# Patient Record
Sex: Female | Born: 1985
Health system: Southern US, Community
[De-identification: ages and names within clinical notes are randomized; demographics above are authoritative.]

## PROBLEM LIST (undated history)

## (undated) ENCOUNTER — Emergency Department (HOSPITAL_BASED_OUTPATIENT_CLINIC_OR_DEPARTMENT_OTHER): Admission: EM | Payer: Medicaid Other | Source: Home / Self Care

## (undated) DIAGNOSIS — O24419 Gestational diabetes mellitus in pregnancy, unspecified control: Secondary | ICD-10-CM

## (undated) DIAGNOSIS — G4730 Sleep apnea, unspecified: Secondary | ICD-10-CM

## (undated) DIAGNOSIS — L732 Hidradenitis suppurativa: Secondary | ICD-10-CM

## (undated) DIAGNOSIS — B009 Herpesviral infection, unspecified: Secondary | ICD-10-CM

## (undated) DIAGNOSIS — B029 Zoster without complications: Secondary | ICD-10-CM

## (undated) DIAGNOSIS — K8020 Calculus of gallbladder without cholecystitis without obstruction: Secondary | ICD-10-CM

## (undated) DIAGNOSIS — N760 Acute vaginitis: Secondary | ICD-10-CM

## (undated) DIAGNOSIS — A749 Chlamydial infection, unspecified: Secondary | ICD-10-CM

## (undated) DIAGNOSIS — B9689 Other specified bacterial agents as the cause of diseases classified elsewhere: Secondary | ICD-10-CM

## (undated) DIAGNOSIS — E119 Type 2 diabetes mellitus without complications: Secondary | ICD-10-CM

## (undated) HISTORY — PX: CHOLECYSTECTOMY: SHX55

## (undated) HISTORY — PX: INCISE AND DRAIN ABCESS: PRO64

---

## 2010-02-28 ENCOUNTER — Emergency Department (HOSPITAL_BASED_OUTPATIENT_CLINIC_OR_DEPARTMENT_OTHER): Admission: EM | Admit: 2010-02-28 | Discharge: 2010-02-28 | Payer: Self-pay | Admitting: Emergency Medicine

## 2010-03-02 ENCOUNTER — Emergency Department (HOSPITAL_COMMUNITY): Admission: EM | Admit: 2010-03-02 | Discharge: 2010-03-02 | Payer: Self-pay | Admitting: Emergency Medicine

## 2010-06-08 ENCOUNTER — Emergency Department (HOSPITAL_BASED_OUTPATIENT_CLINIC_OR_DEPARTMENT_OTHER): Admission: EM | Admit: 2010-06-08 | Discharge: 2010-06-08 | Payer: Self-pay | Admitting: Emergency Medicine

## 2010-06-20 ENCOUNTER — Ambulatory Visit (HOSPITAL_COMMUNITY): Admission: RE | Admit: 2010-06-20 | Discharge: 2010-06-21 | Payer: Self-pay | Admitting: General Surgery

## 2010-06-20 ENCOUNTER — Encounter (INDEPENDENT_AMBULATORY_CARE_PROVIDER_SITE_OTHER): Payer: Self-pay | Admitting: General Surgery

## 2010-06-22 ENCOUNTER — Emergency Department (HOSPITAL_BASED_OUTPATIENT_CLINIC_OR_DEPARTMENT_OTHER): Admission: EM | Admit: 2010-06-22 | Discharge: 2010-06-22 | Payer: Self-pay | Admitting: Emergency Medicine

## 2010-08-23 ENCOUNTER — Emergency Department (HOSPITAL_BASED_OUTPATIENT_CLINIC_OR_DEPARTMENT_OTHER): Admission: EM | Admit: 2010-08-23 | Discharge: 2010-08-23 | Payer: Self-pay | Admitting: Emergency Medicine

## 2010-09-15 ENCOUNTER — Ambulatory Visit (HOSPITAL_COMMUNITY): Admission: RE | Admit: 2010-09-15 | Discharge: 2010-09-15 | Payer: Self-pay | Admitting: General Surgery

## 2011-02-09 LAB — CBC
HCT: 39.7 % (ref 36.0–46.0)
Hemoglobin: 12.8 g/dL (ref 12.0–15.0)
Platelets: 260 10*3/uL (ref 150–400)
RDW: 14.2 % (ref 11.5–15.5)

## 2011-02-09 LAB — SURGICAL PCR SCREEN
MRSA, PCR: POSITIVE — AB
Staphylococcus aureus: POSITIVE — AB

## 2011-02-11 LAB — URINALYSIS, ROUTINE W REFLEX MICROSCOPIC
Protein, ur: 30 mg/dL — AB
Urobilinogen, UA: 0.2 mg/dL (ref 0.0–1.0)
pH: 6 (ref 5.0–8.0)

## 2011-02-11 LAB — URINE MICROSCOPIC-ADD ON

## 2011-02-11 LAB — PREGNANCY, URINE: Preg Test, Ur: NEGATIVE

## 2011-02-11 LAB — URINE CULTURE: Colony Count: 10000

## 2011-02-11 LAB — WET PREP, GENITAL
Trich, Wet Prep: NONE SEEN
Yeast Wet Prep HPF POC: NONE SEEN

## 2011-02-12 LAB — COMPREHENSIVE METABOLIC PANEL
Alkaline Phosphatase: 45 U/L (ref 39–117)
BUN: 12 mg/dL (ref 6–23)
Calcium: 8.7 mg/dL (ref 8.4–10.5)
Chloride: 112 mEq/L (ref 96–112)
Creatinine, Ser: 0.7 mg/dL (ref 0.4–1.2)
GFR calc Af Amer: >60 mL/min (ref >60)

## 2011-02-12 LAB — DIFFERENTIAL
Eosinophils Absolute: 0.3 10*3/uL (ref 0.0–0.7)
Eosinophils Relative: 2 % (ref 0–5)
Monocytes Absolute: 1 10*3/uL (ref 0.1–1.0)
Monocytes Relative: 9 % (ref 3–12)
Neutrophils Relative %: 61 % (ref 43–77)

## 2011-02-12 LAB — CBC
HCT: 38 % (ref 36.0–46.0)
Hemoglobin: 12.4 g/dL (ref 12.0–15.0)
MCH: 28.5 pg (ref 26.0–34.0)
MCHC: 32.7 g/dL (ref 30.0–36.0)
MCV: 87.1 fL (ref 78.0–100.0)
RDW: 13 % (ref 11.5–15.5)
WBC: 10.6 10*3/uL — ABNORMAL HIGH (ref 4.0–10.5)

## 2011-03-14 ENCOUNTER — Emergency Department (HOSPITAL_BASED_OUTPATIENT_CLINIC_OR_DEPARTMENT_OTHER)
Admission: EM | Admit: 2011-03-14 | Discharge: 2011-03-15 | Disposition: A | Discharge status: Home / Self Care | Payer: Medicaid Other | Attending: Emergency Medicine | Admitting: Emergency Medicine

## 2011-03-14 DIAGNOSIS — IMO0002 Reserved for concepts with insufficient information to code with codable children: Secondary | ICD-10-CM | POA: Insufficient documentation

## 2011-11-12 ENCOUNTER — Emergency Department (HOSPITAL_BASED_OUTPATIENT_CLINIC_OR_DEPARTMENT_OTHER)
Admission: EM | Admit: 2011-11-12 | Discharge: 2011-11-13 | Disposition: A | Discharge status: Home / Self Care | Payer: Medicaid Other | Attending: Emergency Medicine | Admitting: Emergency Medicine

## 2011-11-12 ENCOUNTER — Encounter: Payer: Self-pay | Admitting: *Deleted

## 2011-11-12 DIAGNOSIS — S20219A Contusion of unspecified front wall of thorax, initial encounter: Secondary | ICD-10-CM | POA: Insufficient documentation

## 2011-11-12 DIAGNOSIS — Y92009 Unspecified place in unspecified non-institutional (private) residence as the place of occurrence of the external cause: Secondary | ICD-10-CM | POA: Insufficient documentation

## 2011-11-12 DIAGNOSIS — Y93E1 Activity, personal bathing and showering: Secondary | ICD-10-CM | POA: Insufficient documentation

## 2011-11-12 DIAGNOSIS — W010XXA Fall on same level from slipping, tripping and stumbling without subsequent striking against object, initial encounter: Secondary | ICD-10-CM | POA: Insufficient documentation

## 2011-11-12 HISTORY — DX: Calculus of gallbladder without cholecystitis without obstruction: K80.20

## 2011-11-12 HISTORY — DX: Gestational diabetes mellitus in pregnancy, unspecified control: O24.419

## 2011-11-12 NOTE — ED Notes (Signed)
Pt reports she fell on Wednesday in shower- was stepping out of tub and slipped and hit right side on tub- having right side rib and chest pain- now also having "heat" in posterior thighs, bilateral

## 2011-11-13 ENCOUNTER — Emergency Department (INDEPENDENT_AMBULATORY_CARE_PROVIDER_SITE_OTHER): Payer: Medicaid Other

## 2011-11-13 ENCOUNTER — Other Ambulatory Visit: Payer: Self-pay

## 2011-11-13 DIAGNOSIS — R079 Chest pain, unspecified: Secondary | ICD-10-CM

## 2011-11-13 LAB — DIFFERENTIAL
Basophils Relative: 0 % (ref 0–1)
Eosinophils Absolute: 0.2 10*3/uL (ref 0.0–0.7)
Eosinophils Relative: 1 % (ref 0–5)
Lymphocytes Relative: 27 % (ref 12–46)
Monocytes Absolute: 0.7 10*3/uL (ref 0.1–1.0)
Neutro Abs: 8.5 10*3/uL — ABNORMAL HIGH (ref 1.7–7.7)

## 2011-11-13 LAB — BASIC METABOLIC PANEL
BUN: 16 mg/dL (ref 6–23)
Chloride: 99 mEq/L (ref 96–112)
Creatinine, Ser: 0.7 mg/dL (ref 0.50–1.10)
Glucose, Bld: 497 mg/dL — ABNORMAL HIGH (ref 70–99)
Sodium: 133 mEq/L — ABNORMAL LOW (ref 135–145)

## 2011-11-13 LAB — CBC
HCT: 38.3 % (ref 36.0–46.0)
Hemoglobin: 12.7 g/dL (ref 12.0–15.0)
MCH: 27.9 pg (ref 26.0–34.0)
MCHC: 33.2 g/dL (ref 30.0–36.0)
Platelets: 226 10*3/uL (ref 150–400)

## 2011-11-13 LAB — D-DIMER, QUANTITATIVE: D-Dimer, Quant: 0.26 ug/mL-FEU (ref 0.00–0.48)

## 2011-11-13 MED ORDER — HYDROCODONE-ACETAMINOPHEN 5-325 MG PO TABS
2.0000 | ORAL_TABLET | Freq: Once | ORAL | Status: AC
  Administered 2011-11-13: 2 via ORAL
  Filled 2011-11-13: qty 2

## 2011-11-13 MED ORDER — HYDROCODONE-ACETAMINOPHEN 5-325 MG PO TABS: 2.0000 | ORAL_TABLET | ORAL | Status: AC | PRN

## 2011-11-13 MED ORDER — IBUPROFEN 800 MG PO TABS
800.0000 mg | ORAL_TABLET | Freq: Once | ORAL | Status: AC
  Administered 2011-11-13: 800 mg via ORAL
  Filled 2011-11-13: qty 1

## 2011-11-13 NOTE — ED Provider Notes (Addendum)
History    This chart was scribed for Sunnie Nielsen, MD, MD by Smitty Pluck. The patient was seen in room MH02 and the patient's care was started at 12:34AM.   CSN: [id number] Arrival date & time: 11/12/2011 11:59 PM   First MD Initiated Contact with Patient 11/13/11 0006      Chief Complaint  Patient presents with  . Chest Pain    (Consider location/radiation/quality/duration/timing/severity/associated sxs/prior treatment) The history is provided by the patient.   Glenda Mann is a 25 y.o. female who presents to the Emergency Department complaining of right lateral rib pain onset 4 days ago after stepping out of tub and slipping onto tub. Pt states pain is sharp and constant since onset. Pt reports exertion and deep breathing aggravate the pain. Pt denies having abdominal pain and SOB. She also states she has bilateral soreness in thighs.   No radiation of pain. No fevers, no cough.   PCP is Dr. Okey Dupre   Past Medical History  Diagnosis Date  . Gestational diabetes   . Gallstones     Past Surgical History  Procedure Date  . Cholecystectomy     No family history on file.  History  Substance Use Topics  . Smoking status: Current Everyday Smoker -- 0.5 packs/day  . Smokeless tobacco: Never Used  . Alcohol Use: Not on file    OB History    Grav Para Term Preterm Abortions TAB SAB Ect Mult Living                  Review of Systems  Constitutional: Negative for fever and chills.  HENT: Negative for sore throat, neck pain and neck stiffness.   Eyes: Negative for pain.  Respiratory: Negative for chest tightness, shortness of breath, wheezing and stridor.   Cardiovascular: Positive for chest pain. Negative for palpitations and leg swelling.  Gastrointestinal: Negative for nausea, vomiting and abdominal pain.  Genitourinary: Negative for dysuria.  Musculoskeletal: Negative for back pain.  Skin: Negative for rash.  Neurological: Negative for headaches.  All other  systems reviewed and are negative.   10 Systems reviewed and are negative for acute change except as noted in the HPI.  Allergies  Penicillins  Home Medications  No current outpatient prescriptions on file.  BP 133/82  Pulse 110  Temp(Src) 98.8 F (37.1 C) (Oral)  Resp 20  Ht 5\' 4"  (1.626 m)  Wt 252 lb 6.8 oz (114.5 kg)  BMI 43.33 kg/m2  SpO2 99%  LMP 11/10/2011  Physical Exam  Nursing note and vitals reviewed. Constitutional: She is oriented to person, place, and time. She appears well-developed and well-nourished. No distress.  HENT:  Head: Normocephalic and atraumatic.  Eyes: EOM are normal. Pupils are equal, round, and reactive to light.  Neck: Normal range of motion. Neck supple. No tracheal deviation present.  Cardiovascular: Normal rate, regular rhythm and normal heart sounds.   Pulmonary/Chest: Effort normal. No respiratory distress.       Equal breath sounds  Abdominal: Soft. She exhibits no distension. There is no tenderness.       No RUQ tenderness  Musculoskeletal: Normal range of motion.       Tender over lateral ribs.  No crepitus.    Neurological: She is alert and oriented to person, place, and time.  Skin: Skin is warm and dry.  Psychiatric: She has a normal mood and affect. Her behavior is normal.    ED Course  Procedures (including critical care time)  DIAGNOSTIC STUDIES:  Oxygen Saturation is 99% on room air, normal by my interpretation.    COORDINATION OF CARE:   Date: 11/13/2011  Rate: 104  Rhythm: normal sinus rhythm  QRS Axis: normal  Intervals: normal  ST/T Wave abnormalities: nonspecific ST changes  Conduction Disutrbances:none  Narrative Interpretation:   Old EKG Reviewed: none available    Labs Reviewed - No data to display Dg Chest 2 View  11/13/2011  *RADIOLOGY REPORT*  Clinical Data: Chest pain  CHEST - 2 VIEW  Comparison: 06/17/2010  Findings: Lungs are clear. No pleural effusion or pneumothorax. The cardiomediastinal  contours are within normal limits. The visualized bones and soft tissues are without significant appreciable abnormality.  IMPRESSION: No acute cardiopulmonary process.  Original Report Authenticated By: Waneta Martins, M.D.    Rib contusion  Pain control, imaging, screening ECG   MDM   Contusion versus possible rib Fx, no PTX. Injury precautions for same provided. RX pain pills and reliable historian states understanding all d/c and f/u instructions.      I personally performed the services described in this documentation, which was scribed in my presence. The recorded information has been reviewed and considered.    Sunnie Nielsen, MD 11/13/11 [id number]    Sunnie Nielsen, MD 11/13/11 0157

## 2011-11-18 ENCOUNTER — Encounter (HOSPITAL_BASED_OUTPATIENT_CLINIC_OR_DEPARTMENT_OTHER): Payer: Self-pay | Admitting: *Deleted

## 2011-11-18 ENCOUNTER — Emergency Department (HOSPITAL_BASED_OUTPATIENT_CLINIC_OR_DEPARTMENT_OTHER)
Admission: EM | Admit: 2011-11-18 | Discharge: 2011-11-18 | Disposition: A | Discharge status: Home / Self Care | Payer: Medicaid Other | Attending: Emergency Medicine | Admitting: Emergency Medicine

## 2011-11-18 DIAGNOSIS — F17200 Nicotine dependence, unspecified, uncomplicated: Secondary | ICD-10-CM | POA: Insufficient documentation

## 2011-11-18 DIAGNOSIS — K089 Disorder of teeth and supporting structures, unspecified: Secondary | ICD-10-CM | POA: Insufficient documentation

## 2011-11-18 DIAGNOSIS — K0889 Other specified disorders of teeth and supporting structures: Secondary | ICD-10-CM

## 2011-11-18 MED ORDER — NAPROXEN 500 MG PO TABS: 500.0000 mg | ORAL_TABLET | Freq: Two times a day (BID) | ORAL | Status: DC

## 2011-11-18 MED ORDER — HYDROCODONE-ACETAMINOPHEN 5-325 MG PO TABS: 1.0000 | ORAL_TABLET | Freq: Four times a day (QID) | ORAL | Status: AC | PRN

## 2011-11-18 MED ORDER — HYDROCODONE-ACETAMINOPHEN 5-325 MG PO TABS
1.0000 | ORAL_TABLET | Freq: Once | ORAL | Status: AC
  Administered 2011-11-18: 1 via ORAL
  Filled 2011-11-18: qty 1

## 2011-11-18 MED ORDER — CLINDAMYCIN HCL 150 MG PO CAPS: 150.0000 mg | ORAL_CAPSULE | Freq: Four times a day (QID) | ORAL | Status: AC

## 2011-11-18 MED ORDER — PENICILLIN V POTASSIUM 500 MG PO TABS: 500.0000 mg | ORAL_TABLET | Freq: Four times a day (QID) | ORAL | Status: AC

## 2011-11-18 NOTE — ED Notes (Signed)
Patient c/o R upper jaw tooth pain, states she chipped a tooth and the right side of her jaw hurts

## 2011-11-18 NOTE — ED Provider Notes (Signed)
History     CSN: [id number]  Arrival date & time 11/18/11  1002   First MD Initiated Contact with Patient 11/18/11 1013      Chief Complaint  Patient presents with  . Dental Pain    (Consider location/radiation/quality/duration/timing/severity/associated sxs/prior treatment) Patient is a 25 y.o. female presenting with tooth pain.  Dental PainThe primary symptoms include mouth pain and dental injury. Primary symptoms do not include oral bleeding, headaches, fever, shortness of breath, sore throat, angioedema or cough. The symptoms began more than 1 month ago (But gets significantly worse today). The symptoms are worsening. The symptoms are chronic. The symptoms occur constantly.  Mouth pain began more than 1 month ago. Mouth pain occurs intermittently. Mouth pain is worsening. Affected locations include: teeth. At its highest the mouth pain was at 10/10. The mouth pain is currently at 10/10.   Patient with right upper molar with pain in that for several weeks to months after the tooth cracked starting this morning at around 3 in the morning the tooth pain became constant and more severe in the past has been intermittent.  Past Medical History  Diagnosis Date  . Gestational diabetes   . Gallstones     Past Surgical History  Procedure Date  . Cholecystectomy     No family history on file.  History  Substance Use Topics  . Smoking status: Current Everyday Smoker -- 0.5 packs/day  . Smokeless tobacco: Never Used  . Alcohol Use: 2.0 oz/week    4 drink(s) per week    OB History    Grav Para Term Preterm Abortions TAB SAB Ect Mult Living                  Review of Systems  Constitutional: Negative for fever and chills.  HENT: Positive for dental problem. Negative for congestion, sore throat, neck pain and neck stiffness.   Respiratory: Negative for cough and shortness of breath.   Cardiovascular: Negative for chest pain.  Gastrointestinal: Negative for abdominal pain.    Genitourinary: Negative for dysuria.  Musculoskeletal: Negative for back pain.  Skin: Negative for rash.  Neurological: Negative for headaches.  Hematological: Does not bruise/bleed easily.    Allergies  Penicillins  Home Medications   Current Outpatient Rx  Name Route Sig Dispense Refill  . CLINDAMYCIN HCL 150 MG PO CAPS Oral Take 1 capsule (150 mg total) by mouth every 6 (six) hours. 28 capsule 0  . HYDROCODONE-ACETAMINOPHEN 5-325 MG PO TABS Oral Take 2 tablets by mouth every 4 (four) hours as needed for pain. 6 tablet 0  . HYDROCODONE-ACETAMINOPHEN 5-325 MG PO TABS Oral Take 1-2 tablets by mouth every 6 (six) hours as needed for pain. 14 tablet 0  . NAPROXEN 500 MG PO TABS Oral Take 1 tablet (500 mg total) by mouth 2 (two) times daily. 20 tablet 0  . PENICILLIN V POTASSIUM 500 MG PO TABS Oral Take 1 tablet (500 mg total) by mouth 4 (four) times daily. 40 tablet 0    BP 148/91  Pulse 66  Temp 98.6 F (37 C)  Resp 19  SpO2 98%  LMP 11/10/2011  Physical Exam  Nursing note and vitals reviewed. Constitutional: She is oriented to person, place, and time. She appears well-developed and well-nourished.  HENT:  Head: Normocephalic and atraumatic.  Mouth/Throat: Oropharynx is clear and moist.       Right upper molar with significant decay, and avulsed. Surrounding gum swelling, no bleeding no purulent discharge. No swelling to  the floor the mouth. Mucous membranes are moist, no cheek or jaw swelling.   Eyes: Conjunctivae are normal. Pupils are equal, round, and reactive to light.  Neck: Normal range of motion. Neck supple.  Cardiovascular: Normal rate, regular rhythm, normal heart sounds and intact distal pulses.   No murmur heard. Pulmonary/Chest: Effort normal.  Abdominal: Soft. Bowel sounds are normal. There is no tenderness.  Musculoskeletal: Normal range of motion.  Neurological: She is alert and oriented to person, place, and time. No cranial nerve deficit. She exhibits  normal muscle tone. Coordination normal.  Skin: Skin is warm and dry. No rash noted. No erythema.    ED Course  Procedures (including critical care time)  Labs Reviewed - No data to display No results found.   1. Toothache       MDM  Right upper molar with significant decay gum swelling and pain and tenderness, consistent with apical tooth abscess, no cheek swelling. No floor of mouth swelling. Patient states history of penicillin allergy but she never had penicillin this is based on the fact that her daughter and mother are allergic to penicillin. Will provide patient with prescription for penicillin, since preferable for tooth pain, but will also provide her with a prescription for clindamycin in case she would prefer to take that. Patient given hydrocodone in the emergency partner prior to discharge, also given prescription for Naprosyn and hydrocodone.        Shelda Jakes, MD 11/18/11 [phone]

## 2011-12-12 ENCOUNTER — Emergency Department (HOSPITAL_BASED_OUTPATIENT_CLINIC_OR_DEPARTMENT_OTHER)
Admission: EM | Admit: 2011-12-12 | Discharge: 2011-12-13 | Disposition: A | Discharge status: Home / Self Care | Payer: Medicaid Other | Attending: Emergency Medicine | Admitting: Emergency Medicine

## 2011-12-12 ENCOUNTER — Encounter (HOSPITAL_BASED_OUTPATIENT_CLINIC_OR_DEPARTMENT_OTHER): Payer: Self-pay | Admitting: Emergency Medicine

## 2011-12-12 DIAGNOSIS — L0390 Cellulitis, unspecified: Secondary | ICD-10-CM

## 2011-12-12 DIAGNOSIS — L0291 Cutaneous abscess, unspecified: Secondary | ICD-10-CM

## 2011-12-12 DIAGNOSIS — L03319 Cellulitis of trunk, unspecified: Secondary | ICD-10-CM | POA: Insufficient documentation

## 2011-12-12 DIAGNOSIS — F17200 Nicotine dependence, unspecified, uncomplicated: Secondary | ICD-10-CM | POA: Insufficient documentation

## 2011-12-12 DIAGNOSIS — L02219 Cutaneous abscess of trunk, unspecified: Secondary | ICD-10-CM | POA: Insufficient documentation

## 2011-12-12 MED ORDER — HYDROMORPHONE HCL PF 2 MG/ML IJ SOLN: 2.0000 mg | Freq: Once | INTRAMUSCULAR | Status: DC

## 2011-12-12 MED ORDER — HYDROMORPHONE HCL PF 2 MG/ML IJ SOLN
INTRAMUSCULAR | Status: AC
  Filled 2011-12-12: qty 1

## 2011-12-12 MED ORDER — OXYCODONE-ACETAMINOPHEN 5-325 MG PO TABS: 1.0000 | ORAL_TABLET | Freq: Four times a day (QID) | ORAL | Status: AC | PRN

## 2011-12-12 MED ORDER — DOXYCYCLINE HYCLATE 100 MG PO CAPS: 100.0000 mg | ORAL_CAPSULE | Freq: Two times a day (BID) | ORAL | Status: AC

## 2011-12-12 MED ORDER — LIDOCAINE HCL (PF) 1 % IJ SOLN
INTRAMUSCULAR | Status: AC
  Filled 2011-12-12: qty 5

## 2011-12-12 MED ORDER — DOXYCYCLINE HYCLATE 100 MG PO TABS
100.0000 mg | ORAL_TABLET | Freq: Once | ORAL | Status: AC
  Administered 2011-12-12: 100 mg via ORAL
  Filled 2011-12-12: qty 1

## 2011-12-12 NOTE — ED Notes (Signed)
Pt abscess between breasts

## 2011-12-12 NOTE — ED Provider Notes (Signed)
History     CSN: [id number]  Arrival date & time 12/12/11  2249   First MD Initiated Contact with Patient 12/12/11 2307      Chief Complaint  Patient presents with  . Abscess    (Consider location/radiation/quality/duration/timing/severity/associated sxs/prior treatment) Patient is a 26 y.o. female presenting with abscess. The history is provided by the patient. No language interpreter was used.  Abscess  This is a new problem. The current episode started less than one week ago. The onset was gradual. The problem occurs continuously. The problem has been gradually worsening. The abscess is present on the torso (chest). The problem is moderate. The abscess is characterized by redness, painfulness and swelling. It is unknown what she was exposed to. The abscess first occurred at home. Pertinent negatives include no anorexia, no decrease in physical activity, not sleeping less, no fever, no vomiting, no congestion, no rhinorrhea, no sore throat and no cough. There were no sick contacts. Recently, medical care has been given by the PCP (placed on vicodin and tmp-smx at home). Services received include medications given.    Past Medical History  Diagnosis Date  . Gestational diabetes   . Gallstones     Past Surgical History  Procedure Date  . Cholecystectomy     No family history on file.  History  Substance Use Topics  . Smoking status: Current Everyday Smoker -- 0.5 packs/day  . Smokeless tobacco: Never Used  . Alcohol Use: 2.0 oz/week    4 drink(s) per week    OB History    Grav Para Term Preterm Abortions TAB SAB Ect Mult Living                  Review of Systems  Constitutional: Negative for fever, activity change, appetite change and fatigue.  HENT: Negative for congestion, sore throat, rhinorrhea, neck pain and neck stiffness.   Respiratory: Negative for cough and shortness of breath.   Cardiovascular: Negative for chest pain and palpitations.  Gastrointestinal:  Negative for nausea, vomiting, abdominal pain and anorexia.  Genitourinary: Negative for dysuria, urgency, frequency and flank pain.  Skin: Positive for wound. Negative for rash.  Neurological: Negative for dizziness, weakness, light-headedness, numbness and headaches.  All other systems reviewed and are negative.    Allergies  Penicillins  Home Medications   Current Outpatient Rx  Name Route Sig Dispense Refill  . BENZOCAINE-ICHTHAMMOL-SULFUR 5-1.86-0.44 % EX OINT Apply externally Apply topically 2 (two) times daily as needed. For boil pain    . HYDROCODONE-ACETAMINOPHEN 5-500 MG PO TABS Oral Take 1 tablet by mouth every 6 (six) hours as needed. For pain    . SULFAMETHOXAZOLE-TMP DS 800-160 MG PO TABS Oral Take 1 tablet by mouth 2 (two) times daily.    Marland Kitchen DOXYCYCLINE HYCLATE 100 MG PO CAPS Oral Take 1 capsule (100 mg total) by mouth 2 (two) times daily. 20 capsule 0  . OXYCODONE-ACETAMINOPHEN 5-325 MG PO TABS Oral Take 1-2 tablets by mouth every 6 (six) hours as needed for pain. 20 tablet 0    BP 127/87  Pulse 122  Temp(Src) 99 F (37.2 C) (Oral)  Resp 18  SpO2 100%  Physical Exam  Nursing note and vitals reviewed. Constitutional: She is oriented to person, place, and time. She appears well-developed and well-nourished.       Uncomfortable appearing  HENT:  Head: Normocephalic and atraumatic.  Mouth/Throat: Oropharynx is clear and moist.  Eyes: Conjunctivae and EOM are normal. Pupils are equal, round, and reactive  to light.  Neck: Normal range of motion. Neck supple.  Cardiovascular: Normal rate, regular rhythm, normal heart sounds and intact distal pulses.  Exam reveals no gallop and no friction rub.   No murmur heard. Pulmonary/Chest: Effort normal and breath sounds normal. No respiratory distress.  Abdominal: Soft. Bowel sounds are normal. There is no tenderness.  Musculoskeletal: Normal range of motion. She exhibits no tenderness.  Neurological: She is alert and  oriented to person, place, and time. No cranial nerve deficit.  Skin: Skin is warm and dry.       Large 5 x 5 cm abscess with induration and fluctuance. There is associated cellulitis and extending induration.  Located between breasts    ED Course  Procedures (including critical care time)  INCISION AND DRAINAGE Performed by: Dayton Bailiff Consent: Verbal consent obtained. Risks and benefits: risks, benefits and alternatives were discussed Type: abscess  Body area: between breasts  Anesthesia: local infiltration  Local anesthetic: lidocaine 2% without epinephrine  Anesthetic total: 6 ml  Complexity: complex Blunt dissection to break up loculations  Drainage: purulent  Drainage amount: copious  Packing material: 1/4 in iodoform gauze  Patient tolerance: Patient tolerated the procedure well with no immediate complications.   Labs Reviewed - No data to display No results found.   1. Abscess and cellulitis       MDM  Abscess with associated cellulitis. Has taken one day and Bactrim. I will switch her to doxycycline. Copious amounts of purulence came from the wound. She is afebrile. I will place her on pain medication and doxycycline. I provided surgery followup instructed her to return in 2 days for a recheck. There is no indication for IV antibiotics at this time.  I feel her tachycardia is secondary to pain and anxiety rather than a malignant cause        Dayton Bailiff, MD 12/12/11 2339

## 2011-12-12 NOTE — ED Notes (Signed)
Pt is on septra and hydrocodone for same without improvement

## 2011-12-20 ENCOUNTER — Emergency Department (HOSPITAL_BASED_OUTPATIENT_CLINIC_OR_DEPARTMENT_OTHER)
Admission: EM | Admit: 2011-12-20 | Discharge: 2011-12-20 | Disposition: A | Discharge status: Home / Self Care | Payer: Medicaid Other | Attending: Emergency Medicine | Admitting: Emergency Medicine

## 2011-12-20 ENCOUNTER — Encounter (HOSPITAL_BASED_OUTPATIENT_CLINIC_OR_DEPARTMENT_OTHER): Payer: Self-pay | Admitting: *Deleted

## 2011-12-20 DIAGNOSIS — L732 Hidradenitis suppurativa: Secondary | ICD-10-CM

## 2011-12-20 DIAGNOSIS — Z4800 Encounter for change or removal of nonsurgical wound dressing: Secondary | ICD-10-CM

## 2011-12-20 DIAGNOSIS — Z79899 Other long term (current) drug therapy: Secondary | ICD-10-CM | POA: Insufficient documentation

## 2011-12-20 DIAGNOSIS — F17200 Nicotine dependence, unspecified, uncomplicated: Secondary | ICD-10-CM | POA: Insufficient documentation

## 2011-12-20 NOTE — ED Notes (Signed)
Had abcess between breasts drained last week returned for recheck today denies any problems or complications other than states has a throbbing type pain at intervals pt drinking coke and eating chips and dip in exam room

## 2011-12-20 NOTE — ED Provider Notes (Signed)
History     CSN: [id number]  Arrival date & time 12/20/11  1048   First MD Initiated Contact with Patient 12/20/11 1105      Chief Complaint  Patient presents with  . Wound Check     HPI Had abcess between breasts drained last week returned for recheck today denies any problems or complications other than states has a throbbing type pain at intervals pt drinking coke and eating chips and dip in exam room   Past Medical History  Diagnosis Date  . Gestational diabetes   . Gallstones     Past Surgical History  Procedure Date  . Cholecystectomy     History reviewed. No pertinent family history.  History  Substance Use Topics  . Smoking status: Current Everyday Smoker -- 0.5 packs/day  . Smokeless tobacco: Never Used  . Alcohol Use: 2.0 oz/week    4 drink(s) per week    OB History    Grav Para Term Preterm Abortions TAB SAB Ect Mult Living                  Review of Systems Negative except as noted in history of present illness Allergies  Penicillins  Home Medications   Current Outpatient Rx  Name Route Sig Dispense Refill  . BENZOCAINE-ICHTHAMMOL-SULFUR 5-1.86-0.44 % EX OINT Apply externally Apply topically 2 (two) times daily as needed. For boil pain    . DOXYCYCLINE HYCLATE 100 MG PO CAPS Oral Take 1 capsule (100 mg total) by mouth 2 (two) times daily. 20 capsule 0  . HYDROCODONE-ACETAMINOPHEN 5-500 MG PO TABS Oral Take 1 tablet by mouth every 6 (six) hours as needed. For pain    . OXYCODONE-ACETAMINOPHEN 5-325 MG PO TABS Oral Take 1-2 tablets by mouth every 6 (six) hours as needed for pain. 20 tablet 0  . SULFAMETHOXAZOLE-TMP DS 800-160 MG PO TABS Oral Take 1 tablet by mouth 2 (two) times daily.      BP 143/76  Pulse 92  Temp(Src) 98.5 F (36.9 C) (Oral)  Resp 20  SpO2 100%  Physical Exam  Nursing note and vitals reviewed. Constitutional: She is oriented to person, place, and time. She appears well-developed and well-nourished. No distress.  HENT:   Head: Normocephalic and atraumatic.  Eyes: Pupils are equal, round, and reactive to light.  Neck: Normal range of motion.  Cardiovascular: Normal rate and intact distal pulses.   Pulmonary/Chest: No respiratory distress.    Abdominal: Normal appearance. She exhibits no distension.  Musculoskeletal: Normal range of motion.  Neurological: She is alert and oriented to person, place, and time. No cranial nerve deficit.  Skin: Skin is warm and dry. No rash noted.  Psychiatric: She has a normal mood and affect. Her behavior is normal.    ED Course  Procedures (including critical care time)  Labs Reviewed - No data to display No results found.   1. Abscess packing removal       MDM         Nelia Shi, MD 12/20/11 1120

## 2012-02-01 ENCOUNTER — Encounter (HOSPITAL_BASED_OUTPATIENT_CLINIC_OR_DEPARTMENT_OTHER): Payer: Self-pay

## 2012-02-01 ENCOUNTER — Emergency Department (HOSPITAL_BASED_OUTPATIENT_CLINIC_OR_DEPARTMENT_OTHER)
Admission: EM | Admit: 2012-02-01 | Discharge: 2012-02-01 | Disposition: A | Discharge status: Home / Self Care | Payer: Self-pay | Attending: Emergency Medicine | Admitting: Emergency Medicine

## 2012-02-01 DIAGNOSIS — F17200 Nicotine dependence, unspecified, uncomplicated: Secondary | ICD-10-CM | POA: Insufficient documentation

## 2012-02-01 DIAGNOSIS — L732 Hidradenitis suppurativa: Secondary | ICD-10-CM | POA: Insufficient documentation

## 2012-02-01 DIAGNOSIS — Z880 Allergy status to penicillin: Secondary | ICD-10-CM | POA: Insufficient documentation

## 2012-02-01 DIAGNOSIS — Z9089 Acquired absence of other organs: Secondary | ICD-10-CM | POA: Insufficient documentation

## 2012-02-01 DIAGNOSIS — R21 Rash and other nonspecific skin eruption: Secondary | ICD-10-CM | POA: Insufficient documentation

## 2012-02-01 DIAGNOSIS — IMO0002 Reserved for concepts with insufficient information to code with codable children: Secondary | ICD-10-CM | POA: Insufficient documentation

## 2012-02-01 DIAGNOSIS — B029 Zoster without complications: Secondary | ICD-10-CM

## 2012-02-01 DIAGNOSIS — L089 Local infection of the skin and subcutaneous tissue, unspecified: Secondary | ICD-10-CM

## 2012-02-01 MED ORDER — LIDOCAINE HCL 2 % IJ SOLN
INTRAMUSCULAR | Status: AC
  Filled 2012-02-01: qty 1

## 2012-02-01 MED ORDER — ACYCLOVIR 400 MG PO TABS: 800.0000 mg | ORAL_TABLET | Freq: Four times a day (QID) | ORAL | Status: AC

## 2012-02-01 MED ORDER — SULFAMETHOXAZOLE-TMP DS 800-160 MG PO TABS: 1.0000 | ORAL_TABLET | Freq: Two times a day (BID) | ORAL | Status: DC

## 2012-02-01 MED ORDER — OXYCODONE-ACETAMINOPHEN 5-325 MG PO TABS: 2.0000 | ORAL_TABLET | ORAL | Status: AC | PRN

## 2012-02-01 MED ORDER — LIDOCAINE HCL 2 % IJ SOLN
20.0000 mL | Freq: Once | INTRAMUSCULAR | Status: AC
  Administered 2012-02-01: 20 mg via INTRADERMAL

## 2012-02-01 NOTE — Discharge Instructions (Signed)
Shingles Shingles is caused by the same virus that causes chickenpox (varicella zoster virus or VZV). Shingles often occurs many years or decades after having chickenpox. That is why it is more common in adults older than 50 years. The virus reactivates and breaks out as an infection in a nerve root. SYMPTOMS   The initial feeling (sensations) may be pain. This pain is usually described as:   Burning.   Stabbing.   Throbbing.   Tingling in the nerve root.   A red rash will follow in a couple days. The rash may occur in any area of the body and is usually on one side (unilateral) of the body in a band or belt-like pattern. The rash usually starts out as very small blisters (vesicles). They will dry up after 7 to 10 days. This is not usually a significant problem except for the pain it causes.   Long-lasting (chronic) pain is more likely in an elderly person. It can last months to years. This condition is called postherpetic neuralgia.  Shingles can be an extremely severe infection in someone with AIDS, a weakened immune system, or with forms of leukemia. It can also be severe if you are taking transplant medicines or other medicines that weaken the immune system. TREATMENT  Your caregiver will often treat you with:  Antiviral drugs.   Anti-inflammatory drugs.   Pain medicines.  Bed rest is very important in preventing the pain associated with herpes zoster (postherpetic neuralgia). Application of heat in the form of a hot water bottle or electric heating pad or gentle pressure with the hand is recommended to help with the pain or discomfort. PREVENTION  A varicella zoster vaccine is available to help protect against the virus. The Food and Drug Administration approved the varicella zoster vaccine for individuals 58 years of age and older. HOME CARE INSTRUCTIONS   Cool compresses to the area of rash may be helpful.   Only take over-the-counter or prescription medicines for pain,  discomfort, or fever as directed by your caregiver.   Avoid contact with:   Babies.   Pregnant women.   Children with eczema.   Elderly people with transplants.   People with chronic illnesses, such as leukemia and AIDS.   If the area involved is on your face, you may receive a referral for follow-up to a specialist. It is very important to keep all follow-up appointments. This will help avoid eye complications, chronic pain, or disability.  SEEK IMMEDIATE MEDICAL CARE IF:   You develop any pain (headache) in the area of the face or eye. This must be followed carefully by your caregiver or ophthalmologist. An infection in part of your eye (cornea) can be very serious. It could lead to blindness.   You do not have pain relief from prescribed medicines.   Your redness or swelling spreads.   The area involved becomes very swollen and painful.   You have a fever.   You notice any red or painful lines extending away from the affected area toward your heart (lymphangitis).   Your condition is worsening or has changed.  Document Released: 11/13/2005 Document Revised: 11/02/2011 Document Reviewed: 10/18/2009 [facility] Patient Information 2012 [place], [place].Skin Infections A skin infection usually develops as a result of disruption of the skin barrier.  CAUSES  A skin infection might occur following:  Trauma or an injury to the skin such as a cut or insect sting.   Inflammation (as in eczema).   Breaks in the skin between  the toes (as in athlete's foot).   Swelling (edema).  SYMPTOMS  The legs are the most common site affected. Usually there is:  Redness.   Swelling.   Pain.   There may be red streaks in the area of the infection.  TREATMENT   Minor skin infections may be treated with topical antibiotics, but if the skin infection is severe, hospital care and intravenous (IV) antibiotic treatment may be needed.   Most often skin infections can be treated with oral  antibiotic medicine as well as proper rest and elevation of the affected area until the infection improves.   If you are prescribed oral antibiotics, it is important to take them as directed and to take all the pills even if you feel better before you have finished all of the medicine.   You may apply warm compresses to the area for 20-30 minutes 4 times daily.  You might need a tetanus shot now if:  You have no idea when you had the last one.   You have never had a tetanus shot before.   Your wound had dirt in it.  If you need a tetanus shot and you decide not to get one, there is a rare chance of getting tetanus. Sickness from tetanus can be serious. If you get a tetanus shot, your arm may swell and become red and warm at the shot site. This is common and not a problem. SEEK MEDICAL CARE IF:  The pain and swelling from your infection do not improve within 2 days.  SEEK IMMEDIATE MEDICAL CARE IF:  You develop a fever, chills, or other serious problems.  Document Released: 12/21/2004 Document Revised: 11/02/2011 Document Reviewed: 11/02/2008 [facility] Patient Information 2012 [place], [place].

## 2012-02-01 NOTE — ED Provider Notes (Signed)
History/physical exam/procedure(s) were performed by non-physician practitioner and as supervising physician I was immediately available for consultation/collaboration. I have reviewed all notes and am in agreement with care and plan.   Hilario Quarry, MD 02/01/12 [phone]

## 2012-02-01 NOTE — ED Provider Notes (Signed)
History     CSN: [id number]  Arrival date & time 02/01/12  1119   First MD Initiated Contact with Patient 02/01/12 1207      Chief Complaint  Patient presents with  . Abscess    (Consider location/radiation/quality/duration/timing/severity/associated sxs/prior treatment) Patient is a 26 y.o. female presenting with abscess. The history is provided by the patient. No language interpreter was used.  Abscess  This is a new problem. The onset was sudden. The problem occurs continuously. The abscess is present on the left arm. The problem is moderate. The abscess is characterized by redness and painfulness. The abscess first occurred at home. Recently, medical care has been given by a specialist.  Pt has a history of hidradenitis.  Pt also has a rash on left side.  Pt describes the rash as red and itchy and painful  Past Medical History  Diagnosis Date  . Gestational diabetes   . Gallstones     Past Surgical History  Procedure Date  . Cholecystectomy     No family history on file.  History  Substance Use Topics  . Smoking status: Current Everyday Smoker -- 0.5 packs/day  . Smokeless tobacco: Never Used  . Alcohol Use: 2.0 oz/week    4 drink(s) per week    OB History    Grav Para Term Preterm Abortions TAB SAB Ect Mult Living                  Review of Systems  Skin: Positive for rash and wound.  All other systems reviewed and are negative.    Allergies  Penicillins  Home Medications   Current Outpatient Rx  Name Route Sig Dispense Refill  . BENZOCAINE-ICHTHAMMOL-SULFUR 5-1.86-0.44 % EX OINT Apply externally Apply topically 2 (two) times daily as needed. For boil pain    . HYDROCODONE-ACETAMINOPHEN 5-500 MG PO TABS Oral Take 1 tablet by mouth every 6 (six) hours as needed. For pain    . SULFAMETHOXAZOLE-TMP DS 800-160 MG PO TABS Oral Take 1 tablet by mouth 2 (two) times daily.      BP 127/79  Pulse 95  Temp(Src) 98.8 F (37.1 C) (Oral)  Resp 16  Ht 5\' 4"   (1.626 m)  Wt 267 lb (121.11 kg)  BMI 45.83 kg/m2  SpO2 100%  LMP 01/24/2012  Physical Exam  Nursing note and vitals reviewed. Constitutional: She is oriented to person, place, and time. She appears well-developed and well-nourished.  HENT:  Head: Normocephalic and atraumatic.  Eyes: Pupils are equal, round, and reactive to light.  Neck: Normal range of motion. Neck supple.  Cardiovascular: Normal rate.   Pulmonary/Chest: Effort normal.  Musculoskeletal: Normal range of motion.  Neurological: She is alert and oriented to person, place, and time. She has normal reflexes.  Skin: Rash noted. There is erythema.       Swollen area under right axilla,  Red raised pustules left chest  Psychiatric: She has a normal mood and affect.    ED Course  Procedures (including critical care time)  Labs Reviewed - No data to display No results found.   No diagnosis found.    MDM    Pt given rx for acyclovir, bactrim and percocet.  I advised soak axilla,   Return in 2 days for recheck if not improving      Langston Masker, [place] 02/01/12 1255

## 2012-02-01 NOTE — ED Notes (Signed)
Right axilla "boil" x 2 days

## 2012-02-04 ENCOUNTER — Encounter (HOSPITAL_BASED_OUTPATIENT_CLINIC_OR_DEPARTMENT_OTHER): Payer: Self-pay

## 2012-02-04 ENCOUNTER — Emergency Department (HOSPITAL_BASED_OUTPATIENT_CLINIC_OR_DEPARTMENT_OTHER)
Admission: EM | Admit: 2012-02-04 | Discharge: 2012-02-04 | Disposition: A | Discharge status: Home Health | Payer: Self-pay | Attending: Emergency Medicine | Admitting: Emergency Medicine

## 2012-02-04 DIAGNOSIS — L089 Local infection of the skin and subcutaneous tissue, unspecified: Secondary | ICD-10-CM | POA: Insufficient documentation

## 2012-02-04 DIAGNOSIS — L0291 Cutaneous abscess, unspecified: Secondary | ICD-10-CM

## 2012-02-04 DIAGNOSIS — Z09 Encounter for follow-up examination after completed treatment for conditions other than malignant neoplasm: Secondary | ICD-10-CM | POA: Insufficient documentation

## 2012-02-04 DIAGNOSIS — IMO0002 Reserved for concepts with insufficient information to code with codable children: Secondary | ICD-10-CM | POA: Insufficient documentation

## 2012-02-04 DIAGNOSIS — F17200 Nicotine dependence, unspecified, uncomplicated: Secondary | ICD-10-CM | POA: Insufficient documentation

## 2012-02-04 HISTORY — DX: Zoster without complications: B02.9

## 2012-02-04 MED ORDER — HYDROCODONE-ACETAMINOPHEN 5-325 MG PO TABS: 2.0000 | ORAL_TABLET | ORAL | Status: AC | PRN

## 2012-02-04 MED ORDER — DOXYCYCLINE HYCLATE 100 MG PO CAPS: 100.0000 mg | ORAL_CAPSULE | Freq: Two times a day (BID) | ORAL | Status: AC

## 2012-02-04 NOTE — ED Provider Notes (Signed)
History     CSN: [id number]  Arrival date & time 02/04/12  1409   First MD Initiated Contact with Patient 02/04/12 1510      Chief Complaint  Patient presents with  . Wound Check    (Consider location/radiation/quality/duration/timing/severity/associated sxs/prior treatment) Patient is a 26 y.o. female presenting with wound check. The history is provided by the patient. No language interpreter was used.  Wound Check  She was treated in the ED 2 to 3 days ago. Treatments since wound repair include oral antibiotics. There has been colored discharge from the wound. The pain has worsened. She has no difficulty moving the affected extremity or digit.  Pt reports infected area under arm has opened and drained.  Pt reports skin rash has spread.    Past Medical History  Diagnosis Date  . Gestational diabetes   . Gallstones   . Shingles     Past Surgical History  Procedure Date  . Cholecystectomy     No family history on file.  History  Substance Use Topics  . Smoking status: Current Everyday Smoker -- 0.5 packs/day  . Smokeless tobacco: Never Used  . Alcohol Use: 2.0 oz/week    4 drink(s) per week    OB History    Grav Para Term Preterm Abortions TAB SAB Ect Mult Living                  Review of Systems  Skin: Positive for rash and wound.  All other systems reviewed and are negative.    Allergies  Penicillins  Home Medications   Current Outpatient Rx  Name Route Sig Dispense Refill  . ACYCLOVIR 400 MG PO TABS Oral Take 2 tablets (800 mg total) by mouth 4 (four) times daily. 50 tablet 0  . BENZOCAINE-ICHTHAMMOL-SULFUR 5-1.86-0.44 % EX OINT Apply externally Apply topically 2 (two) times daily as needed. For boil pain    . HYDROCODONE-ACETAMINOPHEN 5-500 MG PO TABS Oral Take 1 tablet by mouth every 6 (six) hours as needed. For pain    . OXYCODONE-ACETAMINOPHEN 5-325 MG PO TABS Oral Take 2 tablets by mouth every 4 (four) hours as needed for pain. 15 tablet 0  .  SULFAMETHOXAZOLE-TMP DS 800-160 MG PO TABS Oral Take 1 tablet by mouth 2 (two) times daily. 14 tablet 0    BP 126/88  Pulse 116  Temp(Src) 98.7 F (37.1 C) (Oral)  Resp 20  Ht 5\' 5"  (1.651 m)  Wt 267 lb (121.11 kg)  BMI 44.43 kg/m2  SpO2 98%  LMP 01/24/2012  Physical Exam  Constitutional: She appears well-developed and well-nourished.  HENT:  Head: Normocephalic and atraumatic.  Eyes: Pupils are equal, round, and reactive to light.  Musculoskeletal: Normal range of motion. She exhibits tenderness.  Neurological: She is alert.  Skin: Skin is warm. Rash noted.       Oozing from open area right axilla,  Pustules on side now on both sides,    Psychiatric: She has a normal mood and affect.    ED Course  Procedures (including critical care time)  Labs Reviewed - No data to display No results found.   No diagnosis found.    MDM  Rash that I initially thought shingles is probably not given that it is on both sides.  (Most likely, mrsa,  I will culture wound.  I am going to add Doxycycline.   Pt given referral for primary care        Lonia Skinner [place], [place] 02/04/12 1539

## 2012-02-04 NOTE — ED Notes (Signed)
Pt states that she was dx with shingles a few days ago and had abscess to R axilla, pt states it busted last night and is here for recheck.  Pt states that shingles has spread and she has rash on abdomen and R thigh

## 2012-02-04 NOTE — Discharge Instructions (Signed)
Abscess An abscess (boil or furuncle) is an infected area that contains a collection of pus.  SYMPTOMS Signs and symptoms of an abscess include pain, tenderness, redness, or hardness. You may feel a moveable soft area under your skin. An abscess can occur anywhere in the body.  TREATMENT  A surgical cut (incision) may be made over your abscess to drain the pus. Gauze may be packed into the space or a drain may be looped through the abscess cavity (pocket). This provides a drain that will allow the cavity to heal from the inside outwards. The abscess may be painful for a few days, but should feel much better if it was drained.  Your abscess, if seen early, may not have localized and may not have been drained. If not, another appointment may be required if it does not get better on its own or with medications. HOME CARE INSTRUCTIONS   Only take over-the-counter or prescription medicines for pain, discomfort, or fever as directed by your caregiver.   Take your antibiotics as directed if they were prescribed. Finish them even if you start to feel better.   Keep the skin and clothes clean around your abscess.   If the abscess was drained, you will need to use gauze dressing to collect any draining pus. Dressings will typically need to be changed 3 or more times a day.   The infection may spread by skin contact with others. Avoid skin contact as much as possible.   Practice good hygiene. This includes regular hand washing, cover any draining skin lesions, and do not share personal care items.   If you participate in sports, do not share athletic equipment, towels, whirlpools, or personal care items. Shower after every practice or tournament.   If a draining area cannot be adequately covered:   Do not participate in sports.   Children should not participate in day care until the wound has healed or drainage stops.   If your caregiver has given you a follow-up appointment, it is very important  to keep that appointment. Not keeping the appointment could result in a much worse infection, chronic or permanent injury, pain, and disability. If there is any problem keeping the appointment, you must call back to this facility for assistance.  SEEK MEDICAL CARE IF:   You develop increased pain, swelling, redness, drainage, or bleeding in the wound site.   You develop signs of generalized infection including muscle aches, chills, fever, or a general ill feeling.   You have an oral temperature above 102 F (38.9 C).  MAKE SURE YOU:   Understand these instructions.   Will watch your condition.   Will get help right away if you are not doing well or get worse.  Document Released: 08/23/2005 Document Revised: 11/02/2011 Document Reviewed: 06/16/2008 [facility] Patient Information 2012 [place], [place].Abscess An abscess (boil or furuncle) is an infected area that contains a collection of pus.  SYMPTOMS Signs and symptoms of an abscess include pain, tenderness, redness, or hardness. You may feel a moveable soft area under your skin. An abscess can occur anywhere in the body.  TREATMENT  A surgical cut (incision) may be made over your abscess to drain the pus. Gauze may be packed into the space or a drain may be looped through the abscess cavity (pocket). This provides a drain that will allow the cavity to heal from the inside outwards. The abscess may be painful for a few days, but should feel much better if it was drained.  Your abscess, if seen early, may not have localized and may not have been drained. If not, another appointment may be required if it does not get better on its own or with medications. HOME CARE INSTRUCTIONS  Only take over-the-counter or prescription medicines for pain, discomfort, or fever as directed by your caregiver.  Take your antibiotics as directed if they were prescribed. Finish them even if you start to feel better.  Keep the skin and clothes clean around your  abscess.  If the abscess was drained, you will need to use gauze dressing to collect any draining pus. Dressings will typically need to be changed 3 or more times a day.  The infection may spread by skin contact with others. Avoid skin contact as much as possible.  Practice good hygiene. This includes regular hand washing, cover any draining skin lesions, and do not share personal care items.  If you participate in sports, do not share athletic equipment, towels, whirlpools, or personal care items. Shower after every practice or tournament.  If a draining area cannot be adequately covered:  Do not participate in sports.  Children should not participate in day care until the wound has healed or drainage stops.  If your caregiver has given you a follow-up appointment, it is very important to keep that appointment. Not keeping the appointment could result in a much worse infection, chronic or permanent injury, pain, and disability. If there is any problem keeping the appointment, you must call back to this facility for assistance.  SEEK MEDICAL CARE IF:  You develop increased pain, swelling, redness, drainage, or bleeding in the wound site.  You develop signs of generalized infection including muscle aches, chills, fever, or a general ill feeling.  You have an oral temperature above 102 F (38.9 C).  MAKE SURE YOU:  Understand these instructions.  Will watch your condition.  Will get help right away if you are not doing well or get worse.  Document Released: 08/23/2005 Document Revised: 11/02/2011 Document Reviewed: 06/16/2008 [facility] Patient Information 2012 [place], [place].

## 2012-02-04 NOTE — ED Notes (Signed)
Patients wound cleaned and dressed. Patient tolerates procedure well. Expresses concerns about pain, and educated on care of  wound at home.

## 2012-02-05 NOTE — ED Provider Notes (Signed)
Medical screening examination/treatment/procedure(s) were performed by non-physician practitioner and as supervising physician I was immediately available for consultation/collaboration.   Loren Racer, MD 02/05/12 [phone]

## 2012-02-07 LAB — WOUND CULTURE

## 2012-03-27 ENCOUNTER — Encounter (HOSPITAL_BASED_OUTPATIENT_CLINIC_OR_DEPARTMENT_OTHER): Payer: Self-pay | Admitting: *Deleted

## 2012-03-27 ENCOUNTER — Emergency Department (HOSPITAL_BASED_OUTPATIENT_CLINIC_OR_DEPARTMENT_OTHER)
Admission: EM | Admit: 2012-03-27 | Discharge: 2012-03-27 | Disposition: A | Discharge status: Home / Self Care | Payer: Self-pay | Attending: Emergency Medicine | Admitting: Emergency Medicine

## 2012-03-27 DIAGNOSIS — R079 Chest pain, unspecified: Secondary | ICD-10-CM | POA: Insufficient documentation

## 2012-03-27 DIAGNOSIS — L0231 Cutaneous abscess of buttock: Secondary | ICD-10-CM | POA: Insufficient documentation

## 2012-03-27 DIAGNOSIS — L0291 Cutaneous abscess, unspecified: Secondary | ICD-10-CM

## 2012-03-27 DIAGNOSIS — R51 Headache: Secondary | ICD-10-CM | POA: Insufficient documentation

## 2012-03-27 MED ORDER — KETOROLAC TROMETHAMINE 60 MG/2ML IM SOLN
60.0000 mg | Freq: Once | INTRAMUSCULAR | Status: AC
  Administered 2012-03-27: 60 mg via INTRAMUSCULAR
  Filled 2012-03-27: qty 2

## 2012-03-27 MED ORDER — DIPHENHYDRAMINE HCL 50 MG/ML IJ SOLN
25.0000 mg | Freq: Once | INTRAMUSCULAR | Status: AC
  Administered 2012-03-27: 25 mg via INTRAVENOUS
  Filled 2012-03-27: qty 1

## 2012-03-27 MED ORDER — SULFAMETHOXAZOLE-TRIMETHOPRIM 800-160 MG PO TABS: 1.0000 | ORAL_TABLET | Freq: Two times a day (BID) | ORAL | Status: AC

## 2012-03-27 MED ORDER — METOCLOPRAMIDE HCL 5 MG/ML IJ SOLN
10.0000 mg | Freq: Once | INTRAMUSCULAR | Status: AC
  Administered 2012-03-27: 10 mg via INTRAMUSCULAR
  Filled 2012-03-27: qty 2

## 2012-03-27 NOTE — ED Notes (Signed)
Other sx pt is reporting are hot and cold episodes, HA, and Weakness x1 week.

## 2012-03-27 NOTE — ED Provider Notes (Signed)
History     CSN: [id number]  Arrival date & time 03/27/12  1556   First MD Initiated Contact with Patient 03/27/12 1725      Chief Complaint  Patient presents with  . Abscess    (Consider location/radiation/quality/duration/timing/severity/associated sxs/prior treatment) HPI Comments: Pt states that she has a headache today that was not resolved with 1 ibuprofen:pt states that she is also having some nausea  Patient is a 26 y.o. female presenting with abscess. The history is provided by the patient. No language interpreter was used.  Abscess  This is a new problem. The current episode started less than one week ago. The problem occurs continuously. The problem has been unchanged. Affected Location: right buttock. The problem is moderate. The abscess is characterized by painfulness and redness. Her past medical history is significant for skin abscesses in family. There were no sick contacts. She has received no recent medical care.    Past Medical History  Diagnosis Date  . Gestational diabetes   . Gallstones   . Shingles     Past Surgical History  Procedure Date  . Cholecystectomy     History reviewed. No pertinent family history.  History  Substance Use Topics  . Smoking status: Current Everyday Smoker -- 0.5 packs/day  . Smokeless tobacco: Never Used  . Alcohol Use: 2.0 oz/week    4 drink(s) per week    OB History    Grav Para Term Preterm Abortions TAB SAB Ect Mult Living                  Review of Systems  Constitutional: Negative.   Eyes: Negative.   Respiratory: Negative.   Cardiovascular: Positive for chest pain.  Genitourinary: Negative.     Allergies  Penicillins  Home Medications   Current Outpatient Rx  Name Route Sig Dispense Refill  . HYDROCODONE-ACETAMINOPHEN 5-500 MG PO TABS Oral Take 1 tablet by mouth every 6 (six) hours as needed. For pain      BP 124/73  Pulse 103  Temp(Src) 98.4 F (36.9 C) (Oral)  Resp 18  Ht 5\' 5"  (1.651 m)   Wt 170 lb (77.111 kg)  BMI 28.29 kg/m2  LMP 03/13/2012  Physical Exam  Nursing note and vitals reviewed. Constitutional: She is oriented to person, place, and time. She appears well-developed and well-nourished.  HENT:  Right Ear: External ear normal.  Left Ear: External ear normal.  Mouth/Throat: Oropharynx is clear and moist.  Eyes: Conjunctivae and EOM are normal. Pupils are equal, round, and reactive to light.  Neck: Normal range of motion. Neck supple.  Cardiovascular: Normal rate and regular rhythm.   Pulmonary/Chest: Effort normal and breath sounds normal.  Abdominal: Soft. Bowel sounds are normal. There is no tenderness.  Musculoskeletal: Normal range of motion.  Neurological: She is alert and oriented to person, place, and time.  Skin:       Pt has multiple small red bumps noted to the right buttock    ED Course  Procedures (including critical care time)  Labs Reviewed - No data to display No results found.   1. Headache   2. Abscess       MDM  Pt has the beginnings of abscess or right buttock but nothing needs to be I&D at this time:pt headache is resolved at this time:headache not the worst headache        Teressa Lower, NP 03/27/12 1908

## 2012-03-27 NOTE — ED Notes (Signed)
Pt c/o " bumps to buttocks" and h/a

## 2012-03-27 NOTE — ED Provider Notes (Signed)
Medical screening examination/treatment/procedure(s) were performed by non-physician practitioner and as supervising physician I was immediately available for consultation/collaboration.  Ethelda Chick, MD 03/27/12 [phone]

## 2012-03-27 NOTE — Discharge Instructions (Signed)
Abscess An abscess (boil or furuncle) is an infected area that contains a collection of pus.  SYMPTOMS Signs and symptoms of an abscess include pain, tenderness, redness, or hardness. You may feel a moveable soft area under your skin. An abscess can occur anywhere in the body.  TREATMENT  A surgical cut (incision) may be made over your abscess to drain the pus. Gauze may be packed into the space or a drain may be looped through the abscess cavity (pocket). This provides a drain that will allow the cavity to heal from the inside outwards. The abscess may be painful for a few days, but should feel much better if it was drained.  Your abscess, if seen early, may not have localized and may not have been drained. If not, another appointment may be required if it does not get better on its own or with medications. HOME CARE INSTRUCTIONS   Only take over-the-counter or prescription medicines for pain, discomfort, or fever as directed by your caregiver.   Take your antibiotics as directed if they were prescribed. Finish them even if you start to feel better.   Keep the skin and clothes clean around your abscess.   If the abscess was drained, you will need to use gauze dressing to collect any draining pus. Dressings will typically need to be changed 3 or more times a day.   The infection may spread by skin contact with others. Avoid skin contact as much as possible.   Practice good hygiene. This includes regular hand washing, cover any draining skin lesions, and do not share personal care items.   If you participate in sports, do not share athletic equipment, towels, whirlpools, or personal care items. Shower after every practice or tournament.   If a draining area cannot be adequately covered:   Do not participate in sports.   Children should not participate in day care until the wound has healed or drainage stops.   If your caregiver has given you a follow-up appointment, it is very important  to keep that appointment. Not keeping the appointment could result in a much worse infection, chronic or permanent injury, pain, and disability. If there is any problem keeping the appointment, you must call back to this facility for assistance.  SEEK MEDICAL CARE IF:   You develop increased pain, swelling, redness, drainage, or bleeding in the wound site.   You develop signs of generalized infection including muscle aches, chills, fever, or a general ill feeling.   You have an oral temperature above 102 F (38.9 C).  MAKE SURE YOU:   Understand these instructions.   Will watch your condition.   Will get help right away if you are not doing well or get worse.  Document Released: 08/23/2005 Document Revised: 11/02/2011 Document Reviewed: 06/16/2008 [facility] Patient Information 2012 [place], [place].Headaches, Frequently Asked Questions MIGRAINE HEADACHES Q: What is migraine? What causes it? How can I treat it? A: Generally, migraine headaches begin as a dull ache. Then they develop into a constant, throbbing, and pulsating pain. You may experience pain at the temples. You may experience pain at the front or back of one or both sides of the head. The pain is usually accompanied by a combination of:  Nausea.   Vomiting.   Sensitivity to light and noise.  Some people (about 15%) experience an aura (see below) before an attack. The cause of migraine is believed to be chemical reactions in the brain. Treatment for migraine may include over-the-counter or prescription  medications. It may also include self-help techniques. These include relaxation training and biofeedback.  Q: What is an aura? A: About 15% of people with migraine get an "aura". This is a sign of neurological symptoms that occur before a migraine headache. You may see wavy or jagged lines, dots, or flashing lights. You might experience tunnel vision or blind spots in one or both eyes. The aura can include visual or auditory  hallucinations (something imagined). It may include disruptions in smell (such as strange odors), taste or touch. Other symptoms include:  Numbness.   A "pins and needles" sensation.   Difficulty in recalling or speaking the correct word.  These neurological events may last as long as 60 minutes. These symptoms will fade as the headache begins. Q: What is a trigger? A: Certain physical or environmental factors can lead to or "trigger" a migraine. These include:  Foods.   Hormonal changes.   Weather.   Stress.  It is important to remember that triggers are different for everyone. To help prevent migraine attacks, you need to figure out which triggers affect you. Keep a headache diary. This is a good way to track triggers. The diary will help you talk to your healthcare professional about your condition. Q: Does weather affect migraines? A: Bright sunshine, hot, humid conditions, and drastic changes in barometric pressure may lead to, or "trigger," a migraine attack in some people. But studies have shown that weather does not act as a trigger for everyone with migraines. Q: What is the link between migraine and hormones? A: Hormones start and regulate many of your body's functions. Hormones keep your body in balance within a constantly changing environment. The levels of hormones in your body are unbalanced at times. Examples are during menstruation, pregnancy, or menopause. That can lead to a migraine attack. In fact, about three quarters of all women with migraine report that their attacks are related to the menstrual cycle.  Q: Is there an increased risk of stroke for migraine sufferers? A: The likelihood of a migraine attack causing a stroke is very remote. That is not to say that migraine sufferers cannot have a stroke associated with their migraines. In persons under age 14, the most common associated factor for stroke is migraine headache. But over the course of a person's normal life  span, the occurrence of migraine headache may actually be associated with a reduced risk of dying from cerebrovascular disease due to stroke.  Q: What are acute medications for migraine? A: Acute medications are used to treat the pain of the headache after it has started. Examples over-the-counter medications, NSAIDs, ergots, and triptans.  Q: What are the triptans? A: Triptans are the newest class of abortive medications. They are specifically targeted to treat migraine. Triptans are vasoconstrictors. They moderate some chemical reactions in the brain. The triptans work on receptors in your brain. Triptans help to restore the balance of a neurotransmitter called serotonin. Fluctuations in levels of serotonin are thought to be a main cause of migraine.  Q: Are over-the-counter medications for migraine effective? A: Over-the-counter, or "OTC," medications may be effective in relieving mild to moderate pain and associated symptoms of migraine. But you should see your caregiver before beginning any treatment regimen for migraine.  Q: What are preventive medications for migraine? A: Preventive medications for migraine are sometimes referred to as "prophylactic" treatments. They are used to reduce the frequency, severity, and length of migraine attacks. Examples of preventive medications include antiepileptic medications, antidepressants, beta-blockers,  calcium channel blockers, and NSAIDs (nonsteroidal anti-inflammatory drugs). Q: Why are anticonvulsants used to treat migraine? A: During the past few years, there has been an increased interest in antiepileptic drugs for the prevention of migraine. They are sometimes referred to as "anticonvulsants". Both epilepsy and migraine may be caused by similar reactions in the brain.  Q: Why are antidepressants used to treat migraine? A: Antidepressants are typically used to treat people with depression. They may reduce migraine frequency by regulating chemical  levels, such as serotonin, in the brain.  Q: What alternative therapies are used to treat migraine? A: The term "alternative therapies" is often used to describe treatments considered outside the scope of conventional Western medicine. Examples of alternative therapy include acupuncture, acupressure, and yoga. Another common alternative treatment is herbal therapy. Some herbs are believed to relieve headache pain. Always discuss alternative therapies with your caregiver before proceeding. Some herbal products contain arsenic and other toxins. TENSION HEADACHES Q: What is a tension-type headache? What causes it? How can I treat it? A: Tension-type headaches occur randomly. They are often the result of temporary stress, anxiety, fatigue, or anger. Symptoms include soreness in your temples, a tightening band-like sensation around your head (a "vice-like" ache). Symptoms can also include a pulling feeling, pressure sensations, and contracting head and neck muscles. The headache begins in your forehead, temples, or the back of your head and neck. Treatment for tension-type headache may include over-the-counter or prescription medications. Treatment may also include self-help techniques such as relaxation training and biofeedback. CLUSTER HEADACHES Q: What is a cluster headache? What causes it? How can I treat it? A: Cluster headache gets its name because the attacks come in groups. The pain arrives with little, if any, warning. It is usually on one side of the head. A tearing or bloodshot eye and a runny nose on the same side of the headache may also accompany the pain. Cluster headaches are believed to be caused by chemical reactions in the brain. They have been described as the most severe and intense of any headache type. Treatment for cluster headache includes prescription medication and oxygen. SINUS HEADACHES Q: What is a sinus headache? What causes it? How can I treat it? A: When a cavity in the bones  of the face and skull (a sinus) becomes inflamed, the inflammation will cause localized pain. This condition is usually the result of an allergic reaction, a tumor, or an infection. If your headache is caused by a sinus blockage, such as an infection, you will probably have a fever. An x-ray will confirm a sinus blockage. Your caregiver's treatment might include antibiotics for the infection, as well as antihistamines or decongestants.  REBOUND HEADACHES Q: What is a rebound headache? What causes it? How can I treat it? A: A pattern of taking acute headache medications too often can lead to a condition known as "rebound headache." A pattern of taking too much headache medication includes taking it more than 2 days per week or in excessive amounts. That means more than the label or a caregiver advises. With rebound headaches, your medications not only stop relieving pain, they actually begin to cause headaches. Doctors treat rebound headache by tapering the medication that is being overused. Sometimes your caregiver will gradually substitute a different type of treatment or medication. Stopping may be a challenge. Regularly overusing a medication increases the potential for serious side effects. Consult a caregiver if you regularly use headache medications more than 2 days per week or more  than the label advises. ADDITIONAL QUESTIONS AND ANSWERS Q: What is biofeedback? A: Biofeedback is a self-help treatment. Biofeedback uses special equipment to monitor your body's involuntary physical responses. Biofeedback monitors:  Breathing.   Pulse.   Heart rate.   Temperature.   Muscle tension.   Brain activity.  Biofeedback helps you refine and perfect your relaxation exercises. You learn to control the physical responses that are related to stress. Once the technique has been mastered, you do not need the equipment any more. Q: Are headaches hereditary? A: Four out of five (80%) of people that suffer  report a family history of migraine. Scientists are not sure if this is genetic or a family predisposition. Despite the uncertainty, a child has a 50% chance of having migraine if one parent suffers. The child has a 75% chance if both parents suffer.  Q: Can children get headaches? A: By the time they reach high school, most young people have experienced some type of headache. Many safe and effective approaches or medications can prevent a headache from occurring or stop it after it has begun.  Q: What type of doctor should I see to diagnose and treat my headache? A: Start with your primary caregiver. Discuss his or her experience and approach to headaches. Discuss methods of classification, diagnosis, and treatment. Your caregiver may decide to recommend you to a headache specialist, depending upon your symptoms or other physical conditions. Having diabetes, allergies, etc., may require a more comprehensive and inclusive approach to your headache. The National Head[employer] Foundation will provide, upon request, a list of [facility] physician members in your state. Document Released: 02/03/2004 Document Revised: 11/02/2011 Document Reviewed: 07/13/2008 [facility] Patient Information 2012 [place], [place].

## 2012-06-26 ENCOUNTER — Encounter (HOSPITAL_BASED_OUTPATIENT_CLINIC_OR_DEPARTMENT_OTHER): Payer: Self-pay | Admitting: Emergency Medicine

## 2012-06-26 ENCOUNTER — Emergency Department (HOSPITAL_BASED_OUTPATIENT_CLINIC_OR_DEPARTMENT_OTHER): Payer: Medicaid Other

## 2012-06-26 ENCOUNTER — Emergency Department (HOSPITAL_BASED_OUTPATIENT_CLINIC_OR_DEPARTMENT_OTHER)
Admission: EM | Admit: 2012-06-26 | Discharge: 2012-06-26 | Disposition: A | Discharge status: Home / Self Care | Payer: Medicaid Other | Attending: Emergency Medicine | Admitting: Emergency Medicine

## 2012-06-26 DIAGNOSIS — Z9089 Acquired absence of other organs: Secondary | ICD-10-CM | POA: Insufficient documentation

## 2012-06-26 DIAGNOSIS — F17200 Nicotine dependence, unspecified, uncomplicated: Secondary | ICD-10-CM | POA: Insufficient documentation

## 2012-06-26 DIAGNOSIS — R51 Headache: Secondary | ICD-10-CM | POA: Insufficient documentation

## 2012-06-26 DIAGNOSIS — N63 Unspecified lump in unspecified breast: Secondary | ICD-10-CM | POA: Insufficient documentation

## 2012-06-26 DIAGNOSIS — N611 Abscess of the breast and nipple: Secondary | ICD-10-CM

## 2012-06-26 DIAGNOSIS — N61 Mastitis without abscess: Secondary | ICD-10-CM | POA: Insufficient documentation

## 2012-06-26 MED ORDER — DOXYCYCLINE HYCLATE 100 MG PO CAPS: 100.0000 mg | ORAL_CAPSULE | Freq: Two times a day (BID) | ORAL | Status: AC

## 2012-06-26 MED ORDER — LIDOCAINE-EPINEPHRINE 2 %-1:100000 IJ SOLN
20.0000 mL | Freq: Once | INTRAMUSCULAR | Status: AC
  Administered 2012-06-26: 1 mL
  Filled 2012-06-26: qty 1

## 2012-06-26 MED ORDER — KETOROLAC TROMETHAMINE 60 MG/2ML IM SOLN
60.0000 mg | Freq: Once | INTRAMUSCULAR | Status: AC
  Administered 2012-06-26: 60 mg via INTRAMUSCULAR
  Filled 2012-06-26: qty 2

## 2012-06-26 MED ORDER — HYDROCODONE-ACETAMINOPHEN 5-325 MG PO TABS: 2.0000 | ORAL_TABLET | ORAL | Status: AC | PRN

## 2012-06-26 MED ORDER — DIPHENHYDRAMINE HCL 50 MG/ML IJ SOLN
25.0000 mg | Freq: Once | INTRAMUSCULAR | Status: AC
  Administered 2012-06-26: 25 mg via INTRAMUSCULAR
  Filled 2012-06-26: qty 1

## 2012-06-26 MED ORDER — METOCLOPRAMIDE HCL 5 MG/ML IJ SOLN
10.0000 mg | Freq: Once | INTRAMUSCULAR | Status: AC
Start: 2012-06-26 — End: 2012-06-26
  Administered 2012-06-26: 10 mg via INTRAMUSCULAR
  Filled 2012-06-26: qty 2

## 2012-06-26 NOTE — ED Provider Notes (Signed)
History     CSN: [id number]  Arrival date & time 06/26/12  1309   First MD Initiated Contact with Patient 06/26/12 1315      Chief Complaint  Patient presents with  . Headache  . Breast Mass    (Consider location/radiation/quality/duration/timing/severity/associated sxs/prior treatment) HPI Comments: Patient reports frontal headache that has been constant for the past 2 weeks.  Has gradually gotten worse, does not improve with motrin. No history of migraines, but has photophobia and nausea. Denies thunderclap onset.  No focal weakness, numbness, tingling, vision change.  No fever. Also with "lump" to L breast x 2 days.  History of abscess on R breast. No nipple discharge.  The history is provided by the patient.    Past Medical History  Diagnosis Date  . Gestational diabetes   . Gallstones   . Shingles     Past Surgical History  Procedure Date  . Cholecystectomy   . Incise and drain abcess     No family history on file.  History  Substance Use Topics  . Smoking status: Current Everyday Smoker -- 0.5 packs/day  . Smokeless tobacco: Never Used  . Alcohol Use: 2.0 oz/week    4 drink(s) per week    OB History    Grav Para Term Preterm Abortions TAB SAB Ect Mult Living                  Review of Systems  Constitutional: Negative for fever, activity change and appetite change.  HENT: Negative for congestion and rhinorrhea.   Respiratory: Negative for cough, chest tightness and shortness of breath.   Cardiovascular: Negative for chest pain.  Gastrointestinal: Positive for nausea. Negative for vomiting and abdominal pain.  Musculoskeletal: Negative for back pain.  Neurological: Positive for headaches. Negative for syncope, facial asymmetry, light-headedness and numbness.    Allergies  Penicillins  Home Medications   Current Outpatient Rx  Name Route Sig Dispense Refill  . DOXYCYCLINE HYCLATE 100 MG PO CAPS Oral Take 1 capsule (100 mg total) by mouth 2 (two)  times daily. 20 capsule 0  . HYDROCODONE-ACETAMINOPHEN 5-325 MG PO TABS Oral Take 2 tablets by mouth every 4 (four) hours as needed for pain. 10 tablet 0    BP 128/82  Pulse 88  Temp 98.7 F (37.1 C) (Oral)  Resp 20  Ht 5\' 5"  (1.651 m)  Wt 231 lb (104.781 kg)  BMI 38.44 kg/m2  SpO2 100%  LMP 05/29/2012  Physical Exam  Constitutional: She is oriented to person, place, and time. She appears well-developed and well-nourished. No distress.  HENT:  Head: Normocephalic and atraumatic.  Mouth/Throat: Oropharynx is clear and moist. No oropharyngeal exudate.  Eyes: Conjunctivae and EOM are normal. Pupils are equal, round, and reactive to light.  Neck: Normal range of motion. Neck supple.       No meningismus  Cardiovascular: Normal rate, regular rhythm and normal heart sounds.   No murmur heard. Pulmonary/Chest: Effort normal and breath sounds normal. No respiratory distress.    Abdominal: Soft. There is no tenderness. There is no rebound and no guarding.  Musculoskeletal: Normal range of motion. She exhibits no edema and no tenderness.  Neurological: She is alert and oriented to person, place, and time. No cranial nerve deficit.       CN 3-12 intact, 5/5 strength throughout, no nystagmus, no ataxia on finger to nose  Skin: Skin is warm.    ED Course  INCISION AND DRAINAGE Date/Time: 06/26/2012 2:39 PM Performed  by: SOFIA, LESLIE K Authorized by: Elson Areas Consent: Verbal consent obtained. Risks and benefits: risks, benefits and alternatives were discussed Consent given by: patient Patient understanding: patient states understanding of the procedure being performed Required items: required blood products, implants, devices, and special equipment available Patient identity confirmed: verbally with patient Time out: Immediately prior to procedure a "time out" was called to verify the correct patient, procedure, equipment, support staff and site/side marked as required. Type:  abscess Body area: trunk Location details: left breast Anesthesia: local infiltration Local anesthetic: lidocaine 1% with epinephrine Scalpel size: 11 Incision type: single straight Drainage: purulent Drainage amount: moderate Wound treatment: wound left open Packing material: 1/4 in iodoform gauze Patient tolerance: Patient tolerated the procedure well with no immediate complications.   (including critical care time)  Labs Reviewed - No data to display Ct Head Wo Contrast  06/26/2012  *RADIOLOGY REPORT*  Clinical Data: Severe headache  CT HEAD WITHOUT CONTRAST  Technique:  Contiguous axial images were obtained from the base of the skull through the vertex without contrast.  Comparison: None.  Findings: The brain has a normal appearance without evidence of malformation, atrophy, old or acute infarction, mass lesion, hemorrhage, hydrocephalus or extra-axial collection.  There are inflammatory changes of the frontal, ethmoid and sphenoid sinuses that could relate to headache.  IMPRESSION: Normal appearance of the brain.  Sinus inflammatory disease that could be symptomatic.  Original Report Authenticated By: Thomasenia Sales, M.D.     1. Abscess of left breast       MDM  Gradual onset headache x 2 weeks without focal deficit.  History consistent with migraine. Denies thunderclap onset.  History not consistent with meningitis or SAH.  CT head negative for mass.  Headache improved with cocktail.  Breast abscess [facility] by [facility] Doxycycline, 48 recheck.          Glynn Octave, MD 06/26/12 [phone]

## 2012-06-26 NOTE — ED Notes (Signed)
Pt states she has a headache x two weeks.  Pt also states she has knot on her left breast that is painful.  Pt states she has never had a mammogram.

## 2013-09-08 ENCOUNTER — Other Ambulatory Visit: Payer: Self-pay

## 2013-09-08 ENCOUNTER — Encounter (HOSPITAL_BASED_OUTPATIENT_CLINIC_OR_DEPARTMENT_OTHER): Payer: Self-pay | Admitting: Emergency Medicine

## 2013-09-08 ENCOUNTER — Emergency Department (HOSPITAL_BASED_OUTPATIENT_CLINIC_OR_DEPARTMENT_OTHER)
Admission: EM | Admit: 2013-09-08 | Discharge: 2013-09-08 | Disposition: A | Discharge status: Home / Self Care | Payer: Managed Care, Other (non HMO) | Attending: Emergency Medicine | Admitting: Emergency Medicine

## 2013-09-08 ENCOUNTER — Emergency Department (HOSPITAL_BASED_OUTPATIENT_CLINIC_OR_DEPARTMENT_OTHER): Payer: Managed Care, Other (non HMO)

## 2013-09-08 DIAGNOSIS — Z3202 Encounter for pregnancy test, result negative: Secondary | ICD-10-CM | POA: Insufficient documentation

## 2013-09-08 DIAGNOSIS — Z8719 Personal history of other diseases of the digestive system: Secondary | ICD-10-CM | POA: Insufficient documentation

## 2013-09-08 DIAGNOSIS — R071 Chest pain on breathing: Secondary | ICD-10-CM | POA: Insufficient documentation

## 2013-09-08 DIAGNOSIS — Z8619 Personal history of other infectious and parasitic diseases: Secondary | ICD-10-CM | POA: Insufficient documentation

## 2013-09-08 DIAGNOSIS — R0789 Other chest pain: Secondary | ICD-10-CM

## 2013-09-08 DIAGNOSIS — F17200 Nicotine dependence, unspecified, uncomplicated: Secondary | ICD-10-CM | POA: Insufficient documentation

## 2013-09-08 DIAGNOSIS — Z880 Allergy status to penicillin: Secondary | ICD-10-CM | POA: Insufficient documentation

## 2013-09-08 LAB — BASIC METABOLIC PANEL
BUN: 10 mg/dL (ref 6–23)
CO2: 25 mEq/L (ref 19–32)
Chloride: 105 mEq/L (ref 96–112)
Creatinine, Ser: 0.6 mg/dL (ref 0.50–1.10)
GFR calc Af Amer: >90 mL/min (ref >90)
GFR calc non Af Amer: >90 mL/min (ref >90)
Glucose, Bld: 132 mg/dL — ABNORMAL HIGH (ref 70–99)
Potassium: 3.7 mEq/L (ref 3.5–5.1)

## 2013-09-08 LAB — CBC
HCT: 38.7 % (ref 36.0–46.0)
MCH: 28.2 pg (ref 26.0–34.0)
MCHC: 32.3 g/dL (ref 30.0–36.0)
MCV: 87.4 fL (ref 78.0–100.0)
RDW: 13.2 % (ref 11.5–15.5)

## 2013-09-08 NOTE — ED Provider Notes (Signed)
CSN: [id number]     Arrival date & time 09/08/13  1111 History   First MD Initiated Contact with Patient 09/08/13 1133     Chief Complaint  Patient presents with  . Chest Pain   (Consider location/radiation/quality/duration/timing/severity/associated sxs/prior Treatment) HPI Pt presents with c/o chest pain.  She states she has been having an aching in her chest over the past 3-4 days.  Not associated with exertion.  No radiation of pain, no nausea or shortness of breath or diaphoresis associated.  No leg swelling, no hx of DVT/PE, no recent hx of travel/trauma/surgery.  No cough.  Pain is sometimes worse with taking a deep breath.  She has not had any treatment prior to arrival.  She does smoke cigarrettes.  She states she has diabetes- on chart she has gestational diabetes.  There are no other associated systemic symptoms, there are no other alleviating or modifying factors.   Past Medical History  Diagnosis Date  . Gestational diabetes   . Gallstones   . Shingles    Past Surgical History  Procedure Laterality Date  . Cholecystectomy    . Incise and drain abcess     Family History  Problem Relation Age of Onset  . Migraines Mother    History  Substance Use Topics  . Smoking status: Current Every Day Smoker -- 0.50 packs/day  . Smokeless tobacco: Never Used  . Alcohol Use: 2.0 oz/week    4 drink(s) per week   OB History   Grav Para Term Preterm Abortions TAB SAB Ect Mult Living   2 1             Review of Systems ROS reviewed and all otherwise negative except for mentioned in HPI  Allergies  Bactrim and Penicillins  Home Medications  No current outpatient prescriptions on file. BP 131/80  Pulse 83  Temp(Src) 98.8 F (37.1 C) (Oral)  Resp 16  Ht 5\' 5"  (1.651 m)  Wt 231 lb (104.781 kg)  BMI 38.44 kg/m2  SpO2 99%  LMP 07/11/2013  Breastfeeding? Unknown Vitals reviewed Physical Exam Physical Examination: General appearance - alert, well appearing, and in no  distress Mental status - alert, oriented to person, place, and time Eyes - no conjunctival injection, no scleral icterus Mouth - mucous membranes moist, pharynx normal without lesions Neck - supple, no significant adenopathy Chest - clear to auscultation, no wheezes, rales or rhonchi, symmetric air entry, mild ttp to right of sternum Heart - normal rate, regular rhythm, normal S1, S2, no murmurs, rubs, clicks or gallops Abdomen - soft, nontender, nondistended, no masses or organomegaly Extremities - peripheral pulses normal, no pedal edema, no clubbing or cyanosis Skin - normal coloration and turgor, no rashes  ED Course  Procedures (including critical care time)  Date: 09/08/2013  Rate: 75  Rhythm: normal sinus rhythm  QRS Axis: normal  Intervals: normal  ST/T Wave abnormalities: normal  Conduction Disutrbances: none  Narrative Interpretation: unremarkable       Labs Review Labs Reviewed  CBC - Abnormal; Notable for the following:    WBC 11.3 (*)    All other components within normal limits  BASIC METABOLIC PANEL - Abnormal; Notable for the following:    Glucose, Bld 132 (*)    All other components within normal limits  PREGNANCY, URINE  TROPONIN I   Imaging Review Dg Chest 2 View  09/08/2013   CLINICAL DATA:  Chest pain.  EXAM: CHEST  2 VIEW  COMPARISON:  11/13/2011  FINDINGS:  The heart size and mediastinal contours are within normal limits. Both lungs are clear. The visualized skeletal structures are unremarkable.  IMPRESSION: No active cardiopulmonary disease.   Electronically Signed   By: Charlett Nose M.D.   On: 09/08/2013 12:33    EKG Interpretation   None       MDM   1. Chest wall pain    Pt presenting with c/o chest discomfort over the past 3-4 days.  EKG is normal as well as CXR.  Pt states she is diabetic- labs reassuring including negative troponin and pain has been present for several days which coupled with normal EKG and her age makes ACS very  unlikely.  Pt has PERC score of 0.  Suspect chest wall inflammation.  Discharged with strict return precautions.  Pt agreeable with plan.    Ethelda Chick, MD 09/08/13 1320

## 2013-12-05 ENCOUNTER — Encounter (HOSPITAL_BASED_OUTPATIENT_CLINIC_OR_DEPARTMENT_OTHER): Payer: Self-pay | Admitting: Emergency Medicine

## 2013-12-05 ENCOUNTER — Emergency Department (HOSPITAL_BASED_OUTPATIENT_CLINIC_OR_DEPARTMENT_OTHER)
Admission: EM | Admit: 2013-12-05 | Discharge: 2013-12-05 | Disposition: A | Discharge status: Home / Self Care | Payer: Managed Care, Other (non HMO) | Attending: Emergency Medicine | Admitting: Emergency Medicine

## 2013-12-05 DIAGNOSIS — Z8719 Personal history of other diseases of the digestive system: Secondary | ICD-10-CM | POA: Insufficient documentation

## 2013-12-05 DIAGNOSIS — Z880 Allergy status to penicillin: Secondary | ICD-10-CM | POA: Insufficient documentation

## 2013-12-05 DIAGNOSIS — L02229 Furuncle of trunk, unspecified: Secondary | ICD-10-CM | POA: Insufficient documentation

## 2013-12-05 DIAGNOSIS — L0292 Furuncle, unspecified: Secondary | ICD-10-CM

## 2013-12-05 DIAGNOSIS — Z79899 Other long term (current) drug therapy: Secondary | ICD-10-CM | POA: Insufficient documentation

## 2013-12-05 DIAGNOSIS — L02239 Carbuncle of trunk, unspecified: Secondary | ICD-10-CM | POA: Insufficient documentation

## 2013-12-05 DIAGNOSIS — R0789 Other chest pain: Secondary | ICD-10-CM

## 2013-12-05 DIAGNOSIS — Z8619 Personal history of other infectious and parasitic diseases: Secondary | ICD-10-CM | POA: Insufficient documentation

## 2013-12-05 DIAGNOSIS — Z8632 Personal history of gestational diabetes: Secondary | ICD-10-CM | POA: Insufficient documentation

## 2013-12-05 DIAGNOSIS — R071 Chest pain on breathing: Secondary | ICD-10-CM | POA: Insufficient documentation

## 2013-12-05 DIAGNOSIS — F17200 Nicotine dependence, unspecified, uncomplicated: Secondary | ICD-10-CM | POA: Insufficient documentation

## 2013-12-05 MED ORDER — DOXYCYCLINE HYCLATE 100 MG PO CAPS: 100.0000 mg | ORAL_CAPSULE | Freq: Two times a day (BID) | ORAL | Status: DC

## 2013-12-05 MED ORDER — IBUPROFEN 800 MG PO TABS: 800.0000 mg | ORAL_TABLET | Freq: Three times a day (TID) | ORAL | Status: DC | PRN

## 2013-12-05 NOTE — Discharge Instructions (Signed)
Chest Wall Pain Chest wall pain is pain in or around the bones and muscles of your chest. It may take up to 6 weeks to get better. It may take longer if you must stay physically active in your work and activities.  CAUSES  Chest wall pain may happen on its own. However, it may be caused by:  A viral illness like the flu.  Injury.  Coughing.  Exercise.  Arthritis.  Fibromyalgia.  Shingles. HOME CARE INSTRUCTIONS   Avoid overtiring physical activity. Try not to strain or perform activities that cause pain. This includes any activities using your chest or your abdominal and side muscles, especially if heavy weights are used.  Put ice on the sore area.  Put ice in a plastic bag.  Place a towel between your skin and the bag.  Leave the ice on for 15-20 minutes per hour while awake for the first 2 days.  Only take over-the-counter or prescription medicines for pain, discomfort, or fever as directed by your caregiver. SEEK IMMEDIATE MEDICAL CARE IF:   Your pain increases, or you are very uncomfortable.  You have a fever.  Your chest pain becomes worse.  You have new, unexplained symptoms.  You have nausea or vomiting.  You feel sweaty or lightheaded.  You have a cough with phlegm (sputum), or you cough up blood. MAKE SURE YOU:   Understand these instructions.  Will watch your condition.  Will get help right away if you are not doing well or get worse. Document Released: 11/13/2005 Document Revised: 02/05/2012 Document Reviewed: 07/10/2011 [facility] Patient Information 2014 [place], [place].  Abscess An abscess is an infected area that contains a collection of pus and debris.It can occur in almost any part of the body. An abscess is also known as a furuncle or boil. CAUSES  An abscess occurs when tissue gets infected. This can occur from blockage of oil or sweat glands, infection of hair follicles, or a minor injury to the skin. As the body tries to fight the  infection, pus collects in the area and creates pressure under the skin. This pressure causes pain. People with weakened immune systems have difficulty fighting infections and get certain abscesses more often.  SYMPTOMS Usually an abscess develops on the skin and becomes a painful mass that is red, warm, and tender. If the abscess forms under the skin, you may feel a moveable soft area under the skin. Some abscesses break open (rupture) on their own, but most will continue to get worse without care. The infection can spread deeper into the body and eventually into the bloodstream, causing you to feel ill.  DIAGNOSIS  Your caregiver will take your medical history and perform a physical exam. A sample of fluid may also be taken from the abscess to determine what is causing your infection. TREATMENT  Your caregiver may prescribe antibiotic medicines to fight the infection. However, taking antibiotics alone usually does not cure an abscess. Your caregiver may need to make a small cut (incision) in the abscess to drain the pus. In some cases, gauze is packed into the abscess to reduce pain and to continue draining the area. HOME CARE INSTRUCTIONS   Only take over-the-counter or prescription medicines for pain, discomfort, or fever as directed by your caregiver.  If you were prescribed antibiotics, take them as directed. Finish them even if you start to feel better.  If gauze is used, follow your caregiver's directions for changing the gauze.  To avoid spreading the infection:  Keep your draining abscess covered with a bandage.  Wash your hands well.  Do not share personal care items, towels, or whirlpools with others.  Avoid skin contact with others.  Keep your skin and clothes clean around the abscess.  Keep all follow-up appointments as directed by your caregiver. SEEK MEDICAL CARE IF:   You have increased pain, swelling, redness, fluid drainage, or bleeding.  You have muscle aches,  chills, or a general ill feeling.  You have a fever. MAKE SURE YOU:   Understand these instructions.  Will watch your condition.  Will get help right away if you are not doing well or get worse. Document Released: 08/23/2005 Document Revised: 05/14/2012 Document Reviewed: 01/26/2012 [facility] Patient Information 2014 [place].

## 2013-12-05 NOTE — ED Notes (Signed)
MD at bedside. 

## 2013-12-05 NOTE — ED Provider Notes (Signed)
CSN: [id number]     Arrival date & time 12/05/13  2043 History  This chart was scribed for Charles B. Bernette Mayers, MD by Danella Maiers, ED Scribe. This patient was seen in room MH03/MH03 and the patient's care was started at 8:51 PM.   Chief Complaint  Patient presents with  . Chest Pain  . Recurrent Skin Infections   The history is provided by the patient. No language interpreter was used.   HPI Comments: Glenda Mann is a 28 y.o. female with a h/o DM who presents to the Emergency Department complaining of dull right-sided CP that started this afternoon at work. She works Personal assistant. She states she took Tylenol with some relief. She denies falls. She was seen in October for the same with normal x-rays and blood work. She states this CP feels the same as last time. She denies SOB, nausea, vomiting, cough, sore throat, fever.  Pt is also complaining of a red, painful area to her epigastric region. She has a h/o abscesses.   Past Medical History  Diagnosis Date  . Gestational diabetes   . Gallstones   . Shingles    Past Surgical History  Procedure Laterality Date  . Cholecystectomy    . Incise and drain abcess     Family History  Problem Relation Age of Onset  . Migraines Mother    History  Substance Use Topics  . Smoking status: Current Every Day Smoker -- 0.50 packs/day  . Smokeless tobacco: Never Used  . Alcohol Use: 2.0 oz/week    4 drink(s) per week   OB History   Grav Para Term Preterm Abortions TAB SAB Ect Mult Living   2 1             Review of Systems  Constitutional: Negative for fever.  HENT: Negative for sore throat.   Respiratory: Negative for cough and shortness of breath.   Cardiovascular: Positive for chest pain.  Gastrointestinal: Negative for nausea and vomiting.   A complete 10 system review of systems was obtained and all systems are negative except as noted in the HPI and PMH.   Allergies  Bactrim and Penicillins  Home Medications   Current  Outpatient Rx  Name  Route  Sig  Dispense  Refill  . Linagliptin-Metformin HCl (JENTADUETO) 2.03-999 MG TABS   Oral   Take by mouth.          BP 156/98  Pulse 85  Temp(Src) 99.1 F (37.3 C) (Oral)  Resp 16  Ht 5\' 5"  (1.651 m)  Wt 243 lb (110.224 kg)  BMI 40.44 kg/m2  SpO2 100% Physical Exam  Nursing note and vitals reviewed. Constitutional: She is oriented to person, place, and time. She appears well-developed and well-nourished.  HENT:  Head: Normocephalic and atraumatic.  Eyes: EOM are normal. Pupils are equal, round, and reactive to light.  Neck: Normal range of motion. Neck supple.  Cardiovascular: Normal rate, normal heart sounds and intact distal pulses.   Pulmonary/Chest: Effort normal and breath sounds normal.  Abdominal: Bowel sounds are normal. She exhibits no distension. There is no tenderness.  Musculoskeletal: Normal range of motion. She exhibits no edema and no tenderness.  Neurological: She is alert and oriented to person, place, and time. She has normal strength. No cranial nerve deficit or sensory deficit.  Skin: Skin is warm and dry. No rash noted.  1cm x 2cm erythema surrounding small cutaneous boil, no fluctuance  Psychiatric: She has a normal mood and affect.  ED Course  Procedures (including critical care time) Medications - No data to display  DIAGNOSTIC STUDIES: Oxygen Saturation is 100% on RA, normal by my interpretation.    COORDINATION OF CARE: - Discussed treatment plan with pt which includes discharge home with Ibuprofen and doxycycline. Pt agrees to plan.    Labs Review Labs Reviewed - No data to display Imaging Review No results found.  EKG Interpretation    Date/Time:  Friday December 05 2013 20:51:46 EST Ventricular Rate:  89 PR Interval:  162 QRS Duration: 80 QT Interval:  388 QTC Calculation: 472 R Axis:   33 Text Interpretation:  Normal sinus rhythm Normal ECG No significant change since last tracing Confirmed by  SHELDON  MD, CHARLES ([id number]) on 12/05/2013 8:59:49 PM            MDM   1. Chest wall pain   2. Boil    MSK Chest pain, small abdominal wall boil. No concern for cardiac or pulmonary etology.   I personally performed the services described in this documentation, which was scribed in my presence. The recorded information has been reviewed and is accurate.      Charles B. Bernette MayersSheldon, MD 12/05/13 2143

## 2013-12-05 NOTE — ED Notes (Addendum)
Pt states CP that started this morning. Pt states that the pain is center chest and radiates to her right side of her chest. Pt took an off brand tylenol for the pain and it did help a little. Pt denies SOB, N/V, cough. Pt works lifting boxes as a living. Pt also concerned about a red area on her abdomen.

## 2014-09-28 ENCOUNTER — Encounter (HOSPITAL_BASED_OUTPATIENT_CLINIC_OR_DEPARTMENT_OTHER): Payer: Self-pay | Admitting: Emergency Medicine

## 2014-10-08 ENCOUNTER — Emergency Department (HOSPITAL_BASED_OUTPATIENT_CLINIC_OR_DEPARTMENT_OTHER)
Admission: EM | Admit: 2014-10-08 | Discharge: 2014-10-09 | Disposition: A | Discharge status: Home / Self Care | Payer: Managed Care, Other (non HMO) | Attending: Emergency Medicine | Admitting: Emergency Medicine

## 2014-10-08 ENCOUNTER — Encounter (HOSPITAL_BASED_OUTPATIENT_CLINIC_OR_DEPARTMENT_OTHER): Payer: Self-pay | Admitting: *Deleted

## 2014-10-08 DIAGNOSIS — B9689 Other specified bacterial agents as the cause of diseases classified elsewhere: Secondary | ICD-10-CM

## 2014-10-08 DIAGNOSIS — Z3202 Encounter for pregnancy test, result negative: Secondary | ICD-10-CM | POA: Insufficient documentation

## 2014-10-08 DIAGNOSIS — Z8719 Personal history of other diseases of the digestive system: Secondary | ICD-10-CM | POA: Insufficient documentation

## 2014-10-08 DIAGNOSIS — Z8632 Personal history of gestational diabetes: Secondary | ICD-10-CM | POA: Insufficient documentation

## 2014-10-08 DIAGNOSIS — Z720 Tobacco use: Secondary | ICD-10-CM | POA: Insufficient documentation

## 2014-10-08 DIAGNOSIS — N760 Acute vaginitis: Secondary | ICD-10-CM | POA: Insufficient documentation

## 2014-10-08 DIAGNOSIS — Z792 Long term (current) use of antibiotics: Secondary | ICD-10-CM | POA: Insufficient documentation

## 2014-10-08 DIAGNOSIS — Z8619 Personal history of other infectious and parasitic diseases: Secondary | ICD-10-CM | POA: Insufficient documentation

## 2014-10-08 DIAGNOSIS — Z880 Allergy status to penicillin: Secondary | ICD-10-CM | POA: Insufficient documentation

## 2014-10-08 DIAGNOSIS — Z79899 Other long term (current) drug therapy: Secondary | ICD-10-CM | POA: Insufficient documentation

## 2014-10-08 LAB — URINE MICROSCOPIC-ADD ON

## 2014-10-08 LAB — URINALYSIS, ROUTINE W REFLEX MICROSCOPIC
Bilirubin Urine: NEGATIVE
Glucose, UA: NEGATIVE mg/dL
Hgb urine dipstick: NEGATIVE
KETONES UR: NEGATIVE mg/dL
NITRITE: NEGATIVE
PH: 6 (ref 5.0–8.0)
Protein, ur: NEGATIVE mg/dL
Specific Gravity, Urine: 1.027 (ref 1.005–1.030)
UROBILINOGEN UA: 1 mg/dL (ref 0.0–1.0)

## 2014-10-08 LAB — PREGNANCY, URINE: Preg Test, Ur: NEGATIVE

## 2014-10-08 NOTE — ED Notes (Signed)
Pt c/o lower abd pain with vaginal discharge x 2 days 

## 2014-10-09 ENCOUNTER — Encounter (HOSPITAL_BASED_OUTPATIENT_CLINIC_OR_DEPARTMENT_OTHER): Payer: Self-pay | Admitting: Emergency Medicine

## 2014-10-09 LAB — WET PREP, GENITAL
Trich, Wet Prep: NONE SEEN
YEAST WET PREP: NONE SEEN

## 2014-10-09 MED ORDER — NAPROXEN 250 MG PO TABS
500.0000 mg | ORAL_TABLET | Freq: Once | ORAL | Status: AC
  Administered 2014-10-09: 500 mg via ORAL
  Filled 2014-10-09: qty 2

## 2014-10-09 MED ORDER — METRONIDAZOLE 500 MG PO TABS
500.0000 mg | ORAL_TABLET | Freq: Once | ORAL | Status: AC
  Administered 2014-10-09: 500 mg via ORAL
  Filled 2014-10-09: qty 1

## 2014-10-09 MED ORDER — METRONIDAZOLE 500 MG PO TABS: 500.0000 mg | ORAL_TABLET | Freq: Two times a day (BID) | ORAL | Status: DC

## 2014-10-09 MED ORDER — NAPROXEN 375 MG PO TABS: 375.0000 mg | ORAL_TABLET | Freq: Two times a day (BID) | ORAL | Status: DC

## 2014-10-09 NOTE — ED Provider Notes (Signed)
CSN: [id number]     Arrival date & time 10/08/14  2301 History   First MD Initiated Contact with Patient 10/09/14 0050     Chief Complaint  Patient presents with  . Abdominal Pain     (Consider location/radiation/quality/duration/timing/severity/associated sxs/prior Treatment) Patient is a 28 y.o. female presenting with abdominal pain. The history is provided by the patient.  Abdominal Pain Pain location:  Suprapubic Pain quality: aching   Pain radiates to:  Does not radiate Pain severity:  Mild Onset quality:  Gradual Timing:  Constant Progression:  Unchanged Context: not alcohol use   Relieved by:  Nothing Worsened by:  Nothing tried Associated symptoms: dysuria and vaginal discharge   Associated symptoms comment:  2 weeks of both PMD started her on cipro for UTI 2 days ago.   Vaginal discharge:    Quality:  Clear   Severity:  Moderate   Onset quality:  Gradual   Duration:  2 weeks   Timing:  Constant   Progression:  Unchanged   Chronicity:  New Risk factors: not elderly     Past Medical History  Diagnosis Date  . Gestational diabetes   . Gallstones   . Shingles    Past Surgical History  Procedure Laterality Date  . Cholecystectomy    . Incise and drain abcess     Family History  Problem Relation Age of Onset  . Migraines Mother    History  Substance Use Topics  . Smoking status: Current Every Day Smoker -- 0.50 packs/day  . Smokeless tobacco: Never Used  . Alcohol Use: 2.0 oz/week    4 Not specified per week   OB History    Gravida Para Term Preterm AB TAB SAB Ectopic Multiple Living   2 1             Review of Systems  Gastrointestinal: Positive for abdominal pain.  Genitourinary: Positive for dysuria and vaginal discharge.  All other systems reviewed and are negative.     Allergies  Bactrim and Penicillins  Home Medications   Prior to Admission medications   Medication Sig Start Date End Date Taking? Authorizing Provider   ciprofloxacin (CIPRO) 500 MG tablet Take 500 mg by mouth 2 (two) times daily.   Yes Historical Provider, MD  sitaGLIPtin-metformin (JANUMET) 50-1000 MG per tablet Take 1 tablet by mouth 2 (two) times daily with a meal.   Yes Historical Provider, MD  ibuprofen (ADVIL,MOTRIN) 800 MG tablet Take 1 tablet (800 mg total) by mouth every 8 (eight) hours as needed. 12/05/13   Charles B. Bernette MayersSheldon, MD   BP 118/64 mmHg  Pulse 80  Temp(Src) 98.3 F (36.8 C) (Oral)  Resp 20  Ht 5\' 5"  (1.651 m)  Wt 248 lb (112.492 kg)  BMI 41.27 kg/m2  SpO2 98%  LMP 10/06/2014 Physical Exam  Constitutional: She is oriented to person, place, and time. She appears well-developed and well-nourished. No distress.  HENT:  Head: Normocephalic and atraumatic.  Mouth/Throat: Oropharynx is clear and moist.  Eyes: Conjunctivae and EOM are normal.  Neck: Normal range of motion. Neck supple.  Cardiovascular: Normal rate, regular rhythm and intact distal pulses.   Pulmonary/Chest: Effort normal and breath sounds normal. She has no wheezes. She has no rales.  Abdominal: Soft. Bowel sounds are normal. There is no tenderness. There is no rebound.  Genitourinary: Vaginal discharge found.  Chaperone present no cmt or adnexal tenderness  Musculoskeletal: Normal range of motion.  Neurological: She is alert and oriented to person,  place, and time.  Skin: Skin is warm and dry.  Psychiatric: She has a normal mood and affect.    ED Course  Procedures (including critical care time) Labs Review Labs Reviewed  WET PREP, GENITAL - Abnormal; Notable for the following:    Clue Cells Wet Prep HPF POC MANY (*)    WBC, Wet Prep HPF POC MODERATE (*)    All other components within normal limits  URINALYSIS, ROUTINE W REFLEX MICROSCOPIC - Abnormal; Notable for the following:    APPearance CLOUDY (*)    Leukocytes, UA TRACE (*)    All other components within normal limits  URINE MICROSCOPIC-ADD ON - Abnormal; Notable for the following:     Squamous Epithelial / LPF MANY (*)    Bacteria, UA MANY (*)    All other components within normal limits  GC/CHLAMYDIA PROBE AMP  PREGNANCY, URINE    Imaging Review No results found.   EKG Interpretation None      MDM   Final diagnoses:  None   On day 2 of cipro for UTI, will treat for Bacterial vaginosis.  As well.  Follow up with your PMD for ongoing care.      Jasmine AweApril K Aubreyanna Dorrough-Rasch, MD 10/09/14 [phone]

## 2014-10-10 LAB — GC/CHLAMYDIA PROBE AMP
CT Probe RNA: NEGATIVE
GC Probe RNA: NEGATIVE

## 2014-12-31 ENCOUNTER — Encounter (HOSPITAL_BASED_OUTPATIENT_CLINIC_OR_DEPARTMENT_OTHER): Payer: Self-pay | Admitting: *Deleted

## 2014-12-31 ENCOUNTER — Emergency Department (HOSPITAL_BASED_OUTPATIENT_CLINIC_OR_DEPARTMENT_OTHER)
Admission: EM | Admit: 2014-12-31 | Discharge: 2014-12-31 | Disposition: A | Discharge status: Home / Self Care | Payer: PRIVATE HEALTH INSURANCE | Attending: Emergency Medicine | Admitting: Emergency Medicine

## 2014-12-31 DIAGNOSIS — Z79899 Other long term (current) drug therapy: Secondary | ICD-10-CM | POA: Insufficient documentation

## 2014-12-31 DIAGNOSIS — Z720 Tobacco use: Secondary | ICD-10-CM | POA: Insufficient documentation

## 2014-12-31 DIAGNOSIS — Y998 Other external cause status: Secondary | ICD-10-CM | POA: Insufficient documentation

## 2014-12-31 DIAGNOSIS — Z880 Allergy status to penicillin: Secondary | ICD-10-CM | POA: Insufficient documentation

## 2014-12-31 DIAGNOSIS — Y9389 Activity, other specified: Secondary | ICD-10-CM | POA: Insufficient documentation

## 2014-12-31 DIAGNOSIS — Z8619 Personal history of other infectious and parasitic diseases: Secondary | ICD-10-CM | POA: Insufficient documentation

## 2014-12-31 DIAGNOSIS — S31821A Laceration without foreign body of left buttock, initial encounter: Secondary | ICD-10-CM | POA: Insufficient documentation

## 2014-12-31 DIAGNOSIS — Z8719 Personal history of other diseases of the digestive system: Secondary | ICD-10-CM | POA: Insufficient documentation

## 2014-12-31 DIAGNOSIS — Z8632 Personal history of gestational diabetes: Secondary | ICD-10-CM | POA: Insufficient documentation

## 2014-12-31 DIAGNOSIS — W25XXXA Contact with sharp glass, initial encounter: Secondary | ICD-10-CM | POA: Insufficient documentation

## 2014-12-31 DIAGNOSIS — Y9289 Other specified places as the place of occurrence of the external cause: Secondary | ICD-10-CM | POA: Insufficient documentation

## 2014-12-31 MED ORDER — NAPROXEN 500 MG PO TABS: 500.0000 mg | ORAL_TABLET | Freq: Two times a day (BID) | ORAL | Status: DC

## 2014-12-31 MED ORDER — LIDOCAINE-EPINEPHRINE 1 %-1:100000 IJ SOLN
INTRAMUSCULAR | Status: AC
  Filled 2014-12-31: qty 1

## 2014-12-31 NOTE — ED Notes (Signed)
Supplies gathered and placed at bedside for suturing.

## 2014-12-31 NOTE — ED Notes (Signed)
Dressing applied to laceration site after suturing.

## 2014-12-31 NOTE — Discharge Instructions (Signed)
Sutured Wound Care       Sutures are stitches that can be used to close wounds. Caring for your wound can help stop infection and lessen pain.   HOME CARE   Rest and raise (elevate) the injured area until the pain and puffiness (swelling) go away.   Only take medicines as told by your doctor.   Clean the wound gently with mild soap and water once a day after the first 2 days. Rinse off the soap. Pat the area dry with a clean towel. Do not rub the wound.   Change the bandage (dressing) as told by your doctor. If the bandage sticks, soak it off with soapy water. Stop using a bandage after 2 days or after the wound stops leaking fluid.   Put cream on the wound as told by your doctor.   Do not stretch the wound.   Drink enough fluids to keep your pee (urine) clear or pale yellow.   See your doctor to have the sutures removed.   Use sunscreen or sunblock on the wound after it heals.  GET HELP RIGHT AWAY IF:   Your wound gets red, puffy, hot, or tender.   You have more pain in the wound.   You have a red streak that goes away from the wound.   You see yellowish-white fluid (pus) coming out of the wound.   You have a fever.   You have chills and start to shake.   You notice a bad smell coming from the wound.   Your wound will not stop bleeding.  MAKE SURE YOU:   Understand these instructions.   Will watch your condition.   Will get help right away if you are not doing well or get worse.  Document Released: 05/01/2008 Document Revised: 02/05/2012 Document Reviewed: 03/19/2011   [employer] Patient Information 2015 [employer]. This information is not intended to replace advice given to you by your health care provider. Make sure you discuss any questions you have with your health care provider.

## 2014-12-31 NOTE — ED Notes (Signed)
Pt amb to room 1 with quick steady gait in nad. Pt reports celebrating her birthday last night, and placed a glass on her carseat. Pt states she forgot it was there and sat on it. approx 4 cm laceration noted to left buttock. Pt states td is utd, md at bedside.

## 2014-12-31 NOTE — ED Provider Notes (Signed)
CSN: [id number]     Arrival date & time 12/31/14  0757 History   First MD Initiated Contact with Patient 12/31/14 [phone]     Chief Complaint  Patient presents with  . Laceration   Patient is a 29 y.o. female presenting with skin laceration. The history is provided by the patient.  Laceration Location: buttock. Depth:  Cutaneous Quality: jagged   Bleeding: controlled   Time since incident: this morning. Injury mechanism: pt sat down on a glass that she accidentally left on her seat. Pain details:    Quality:  Sharp   Severity:  Mild   Timing:  Constant Foreign body present:  No foreign bodies Relieved by:  Nothing Worsened by:  Nothing tried Ineffective treatments:  None tried Tetanus status:  Up to date   Past Medical History  Diagnosis Date  . Gestational diabetes   . Gallstones   . Shingles    Past Surgical History  Procedure Laterality Date  . Cholecystectomy    . Incise and drain abcess     Family History  Problem Relation Age of Onset  . Migraines Mother    History  Substance Use Topics  . Smoking status: Current Every Day Smoker -- 0.50 packs/day  . Smokeless tobacco: Never Used  . Alcohol Use: 2.0 oz/week    4 Not specified per week   OB History    Gravida Para Term Preterm AB TAB SAB Ectopic Multiple Living   2 1             Review of Systems  All other systems reviewed and are negative.     Allergies  Bactrim and Penicillins  Home Medications   Prior to Admission medications   Medication Sig Start Date End Date Taking? Authorizing Provider  Albiglutide (TANZEUM Crawford) Inject into the skin.   Yes Historical Provider, MD  metFORMIN (GLUCOPHAGE) 1000 MG tablet Take 1,000 mg by mouth 2 (two) times daily with a meal.   Yes Historical Provider, MD  ibuprofen (ADVIL,MOTRIN) 800 MG tablet Take 1 tablet (800 mg total) by mouth every 8 (eight) hours as needed. 12/05/13   Charles B. Bernette Mayers, MD  naproxen (NAPROSYN) 500 MG tablet Take 1 tablet (500 mg total)  by mouth 2 (two) times daily with a meal. As needed for pain 12/31/14   Linwood Dibbles, MD   BP 129/71 mmHg  Pulse 94  Temp(Src) 98.4 F (36.9 C) (Oral)  Resp 18  Ht  (1.651 m)  Wt 240 lb 4.8 oz (108.999 kg)  BMI 39.99 kg/m2  SpO2 100%  LMP 12/29/2014 Physical Exam  Constitutional: She appears well-developed and well-nourished. No distress.  HENT:  Head: Normocephalic and atraumatic.  Right Ear: External ear normal.  Left Ear: External ear normal.  Eyes: Conjunctivae are normal. Right eye exhibits no discharge. Left eye exhibits no discharge. No scleral icterus.  Neck: Neck supple. No tracheal deviation present.  Cardiovascular: Normal rate.   Pulmonary/Chest: Effort normal. No stridor. No respiratory distress.  Musculoskeletal: She exhibits no edema.  Neurological: She is alert. Cranial nerve deficit: no gross deficits.  Skin: Skin is warm and dry. No rash noted.  approx 2 cm curvalinear laceration left superior buttock, not through the dermis  Psychiatric: She has a normal mood and affect.  Nursing note and vitals reviewed.   ED Course  LACERATION REPAIR Date/Time: 12/31/2014 8:50 AM Performed by: Linwood Dibbles Authorized by: Linwood Dibbles Consent: Verbal consent obtained. Written consent not obtained. Risks and benefits: risks,  benefits and alternatives were discussed Consent given by: patient Location: buttock. Laceration length: 2 cm Foreign bodies: no foreign bodies Tendon involvement: none Nerve involvement: none Vascular damage: no Anesthesia: local infiltration and see MAR for details Local anesthetic: lido with epi. Anesthetic total: 10 ml Patient sedated: no Preparation: Patient was prepped and draped in the usual sterile fashion. Irrigation solution: wound cleanser. Irrigation method: jet lavage Amount of cleaning: standard Debridement: none Degree of undermining: none Skin closure: 4-0 Prolene Number of sutures: 7 Technique: simple Approximation:  close Approximation difficulty: simple Dressing: antibiotic ointment Patient tolerance: Patient tolerated the procedure well with no immediate complications   (including critical care time) Labs Review Labs Reviewed - No data to display  Imaging Review No results found.   EKG Interpretation None      MDM   Final diagnoses:  Laceration of buttock, left, initial encounter    TOlerated well.  No fb.  Suture removal 10 days    Linwood DibblesJon Vani Gunner, MD 12/31/14 [phone]

## 2015-02-04 ENCOUNTER — Emergency Department (HOSPITAL_BASED_OUTPATIENT_CLINIC_OR_DEPARTMENT_OTHER)
Admission: EM | Admit: 2015-02-04 | Discharge: 2015-02-04 | Discharge status: Against Medical Advice | Payer: Medicaid Other | Attending: Emergency Medicine | Admitting: Emergency Medicine

## 2015-02-04 ENCOUNTER — Encounter (HOSPITAL_BASED_OUTPATIENT_CLINIC_OR_DEPARTMENT_OTHER): Payer: Self-pay

## 2015-02-04 DIAGNOSIS — L02212 Cutaneous abscess of back [any part, except buttock]: Secondary | ICD-10-CM | POA: Diagnosis present

## 2015-02-04 NOTE — ED Notes (Signed)
Pt c/o two red raised areas to lt center/upper back and to rt center upper/back; area to rt side red and tender; pt states hx of shingles and boils, feels like both

## 2015-02-04 NOTE — ED Notes (Signed)
No answer x 2 in waiting area.

## 2015-04-04 ENCOUNTER — Encounter (HOSPITAL_BASED_OUTPATIENT_CLINIC_OR_DEPARTMENT_OTHER): Payer: Self-pay | Admitting: *Deleted

## 2015-04-04 ENCOUNTER — Emergency Department (HOSPITAL_BASED_OUTPATIENT_CLINIC_OR_DEPARTMENT_OTHER)
Admission: EM | Admit: 2015-04-04 | Discharge: 2015-04-04 | Disposition: A | Discharge status: Home / Self Care | Payer: PRIVATE HEALTH INSURANCE | Attending: Emergency Medicine | Admitting: Emergency Medicine

## 2015-04-04 DIAGNOSIS — N730 Acute parametritis and pelvic cellulitis: Secondary | ICD-10-CM

## 2015-04-04 DIAGNOSIS — R740 Nonspecific elevation of levels of transaminase and lactic acid dehydrogenase [LDH]: Secondary | ICD-10-CM | POA: Insufficient documentation

## 2015-04-04 DIAGNOSIS — N739 Female pelvic inflammatory disease, unspecified: Secondary | ICD-10-CM | POA: Insufficient documentation

## 2015-04-04 DIAGNOSIS — R000 Tachycardia, unspecified: Secondary | ICD-10-CM | POA: Insufficient documentation

## 2015-04-04 DIAGNOSIS — R1084 Generalized abdominal pain: Secondary | ICD-10-CM

## 2015-04-04 DIAGNOSIS — Z79899 Other long term (current) drug therapy: Secondary | ICD-10-CM | POA: Insufficient documentation

## 2015-04-04 DIAGNOSIS — Z880 Allergy status to penicillin: Secondary | ICD-10-CM | POA: Insufficient documentation

## 2015-04-04 DIAGNOSIS — R197 Diarrhea, unspecified: Secondary | ICD-10-CM | POA: Insufficient documentation

## 2015-04-04 DIAGNOSIS — R7401 Elevation of levels of liver transaminase levels: Secondary | ICD-10-CM

## 2015-04-04 DIAGNOSIS — Z792 Long term (current) use of antibiotics: Secondary | ICD-10-CM | POA: Insufficient documentation

## 2015-04-04 DIAGNOSIS — R739 Hyperglycemia, unspecified: Secondary | ICD-10-CM

## 2015-04-04 DIAGNOSIS — Z8632 Personal history of gestational diabetes: Secondary | ICD-10-CM | POA: Insufficient documentation

## 2015-04-04 DIAGNOSIS — Z8719 Personal history of other diseases of the digestive system: Secondary | ICD-10-CM | POA: Insufficient documentation

## 2015-04-04 DIAGNOSIS — Z87891 Personal history of nicotine dependence: Secondary | ICD-10-CM | POA: Insufficient documentation

## 2015-04-04 DIAGNOSIS — Z791 Long term (current) use of non-steroidal anti-inflammatories (NSAID): Secondary | ICD-10-CM | POA: Insufficient documentation

## 2015-04-04 DIAGNOSIS — A5909 Other urogenital trichomoniasis: Secondary | ICD-10-CM | POA: Insufficient documentation

## 2015-04-04 DIAGNOSIS — R112 Nausea with vomiting, unspecified: Secondary | ICD-10-CM

## 2015-04-04 DIAGNOSIS — Z3202 Encounter for pregnancy test, result negative: Secondary | ICD-10-CM | POA: Insufficient documentation

## 2015-04-04 LAB — URINE MICROSCOPIC-ADD ON

## 2015-04-04 LAB — COMPREHENSIVE METABOLIC PANEL
ALT: 113 U/L — AB (ref 14–54)
ANION GAP: 11 (ref 5–15)
AST: 123 U/L — ABNORMAL HIGH (ref 15–41)
Albumin: 3.7 g/dL (ref 3.5–5.0)
Alkaline Phosphatase: 52 U/L (ref 38–126)
BUN: 10 mg/dL (ref 6–20)
CALCIUM: 8.3 mg/dL — AB (ref 8.9–10.3)
CO2: 23 mmol/L (ref 22–32)
CREATININE: 0.57 mg/dL (ref 0.44–1.00)
Chloride: 99 mmol/L — ABNORMAL LOW (ref 101–111)
GLUCOSE: 378 mg/dL — AB (ref 70–99)
POTASSIUM: 3.5 mmol/L (ref 3.5–5.1)
SODIUM: 133 mmol/L — AB (ref 135–145)
Total Bilirubin: 0.9 mg/dL (ref 0.3–1.2)
Total Protein: 7.2 g/dL (ref 6.5–8.1)

## 2015-04-04 LAB — CBC WITH DIFFERENTIAL/PLATELET
Basophils Absolute: 0 10*3/uL (ref 0.0–0.1)
Basophils Relative: 0 % (ref 0–1)
Eosinophils Absolute: 0.1 10*3/uL (ref 0.0–0.7)
Eosinophils Relative: 1 % (ref 0–5)
HCT: 41.6 % (ref 36.0–46.0)
Hemoglobin: 13.4 g/dL (ref 12.0–15.0)
LYMPHS ABS: 1.5 10*3/uL (ref 0.7–4.0)
LYMPHS PCT: 18 % (ref 12–46)
MCH: 27.2 pg (ref 26.0–34.0)
MCHC: 32.2 g/dL (ref 30.0–36.0)
MCV: 84.4 fL (ref 78.0–100.0)
MONOS PCT: 8 % (ref 3–12)
Monocytes Absolute: 0.7 10*3/uL (ref 0.1–1.0)
NEUTROS ABS: 6.2 10*3/uL (ref 1.7–7.7)
NEUTROS PCT: 73 % (ref 43–77)
Platelets: 260 10*3/uL (ref 150–400)
RBC: 4.93 MIL/uL (ref 3.87–5.11)
RDW: 12.6 % (ref 11.5–15.5)
WBC: 8.5 10*3/uL (ref 4.0–10.5)

## 2015-04-04 LAB — URINALYSIS, ROUTINE W REFLEX MICROSCOPIC
Hgb urine dipstick: NEGATIVE
Ketones, ur: 15 mg/dL — AB
NITRITE: NEGATIVE
PROTEIN: 30 mg/dL — AB
Urobilinogen, UA: 1 mg/dL (ref 0.0–1.0)
pH: 6 (ref 5.0–8.0)

## 2015-04-04 LAB — WET PREP, GENITAL: Yeast Wet Prep HPF POC: NONE SEEN

## 2015-04-04 LAB — PREGNANCY, URINE: Preg Test, Ur: NEGATIVE

## 2015-04-04 LAB — CBG MONITORING, ED
Glucose-Capillary: 338 mg/dL — ABNORMAL HIGH (ref 70–99)
Glucose-Capillary: 353 mg/dL — ABNORMAL HIGH (ref 70–99)
Glucose-Capillary: 234 mg/dL — ABNORMAL HIGH (ref 70–99)

## 2015-04-04 LAB — LIPASE, BLOOD: Lipase: 22 U/L (ref 22–51)

## 2015-04-04 MED ORDER — LIDOCAINE HCL (PF) 1 % IJ SOLN
INTRAMUSCULAR | Status: AC
Start: 2015-04-04 — End: 2015-04-04
  Administered 2015-04-04: 2.1 mL
  Filled 2015-04-04: qty 5

## 2015-04-04 MED ORDER — ONDANSETRON HCL 4 MG PO TABS
4.0000 mg | ORAL_TABLET | Freq: Four times a day (QID) | ORAL | Status: DC
Start: 2015-04-04 — End: 2015-04-16

## 2015-04-04 MED ORDER — DOXYCYCLINE HYCLATE 100 MG PO CAPS: 100.0000 mg | ORAL_CAPSULE | Freq: Two times a day (BID) | ORAL | Status: DC

## 2015-04-04 MED ORDER — METOCLOPRAMIDE HCL 5 MG/ML IJ SOLN
10.0000 mg | Freq: Once | INTRAMUSCULAR | Status: AC
  Administered 2015-04-04: 10 mg via INTRAVENOUS
  Filled 2015-04-04: qty 2

## 2015-04-04 MED ORDER — SODIUM CHLORIDE 0.9 % IV BOLUS (SEPSIS)
1000.0000 mL | Freq: Once | INTRAVENOUS | Status: AC
  Administered 2015-04-04: 1000 mL via INTRAVENOUS

## 2015-04-04 MED ORDER — DIPHENHYDRAMINE HCL 50 MG/ML IJ SOLN
50.0000 mg | Freq: Once | INTRAMUSCULAR | Status: DC
  Filled 2015-04-04: qty 1

## 2015-04-04 MED ORDER — MORPHINE SULFATE 4 MG/ML IJ SOLN
4.0000 mg | Freq: Once | INTRAMUSCULAR | Status: AC
Start: 2015-04-04 — End: 2015-04-04
  Administered 2015-04-04: 4 mg via INTRAVENOUS
  Filled 2015-04-04: qty 1

## 2015-04-04 MED ORDER — INSULIN ASPART 100 UNIT/ML IV SOLN
5.0000 [IU] | Freq: Once | INTRAVENOUS | Status: AC
  Administered 2015-04-04: 5 [IU] via INTRAVENOUS
  Filled 2015-04-04: qty 1

## 2015-04-04 MED ORDER — METRONIDAZOLE 500 MG PO TABS
2000.0000 mg | ORAL_TABLET | Freq: Once | ORAL | Status: AC
  Administered 2015-04-04: 2000 mg via ORAL
  Filled 2015-04-04: qty 4

## 2015-04-04 MED ORDER — CEFTRIAXONE SODIUM 250 MG IJ SOLR
250.0000 mg | Freq: Once | INTRAMUSCULAR | Status: AC
  Administered 2015-04-04: 250 mg via INTRAMUSCULAR
  Filled 2015-04-04: qty 250

## 2015-04-04 NOTE — ED Notes (Signed)
Pt reports abdominal pain and vomiting x 1 day- Vaginal d/c also- pt is a type 2 diabetic and hasn't been able to take her medications

## 2015-04-04 NOTE — ED Notes (Signed)
No adverse effects to IM injection.

## 2015-04-04 NOTE — ED Provider Notes (Signed)
CSN: [id number]     Arrival date & time 04/04/15  1404 History   First MD Initiated Contact with Patient 04/04/15 1422     Chief Complaint  Patient presents with  . Abdominal Pain  . Emesis     (Consider location/radiation/quality/duration/timing/severity/associated sxs/prior Treatment) HPI  Glenda Mann is a 29 y.o. female with PMH of gallstones, gestational diabetes, diabetes presenting with one day onset of sharp umbilical and left upper quadrant abdominal pain that is worse with eating and coughing goes. It is associated with 3 episodes of nonbloody emesis as well has looser stool. Patient taking metformin but has not taken any of her medications today. She states she has chills but no fevers. Abdominal surgeries include cholecystectomy. As BM today and normal without blood or melanotic stool. Patient states she is passing gas without difficulty. Patient also complaining of white vaginal discharge without bleeding that is periodic.   Past Medical History  Diagnosis Date  . Gestational diabetes   . Gallstones   . Shingles    Past Surgical History  Procedure Laterality Date  . Cholecystectomy    . Incise and drain abcess     Family History  Problem Relation Age of Onset  . Migraines Mother    History  Substance Use Topics  . Smoking status: Former Smoker -- 0.50 packs/day  . Smokeless tobacco: Never Used  . Alcohol Use: 2.0 oz/week    4 Standard drinks or equivalent per week   OB History    Gravida Para Term Preterm AB TAB SAB Ectopic Multiple Living   2 1             Review of Systems 10 Systems reviewed and are negative for acute change except as noted in the HPI.    Allergies  Bactrim and Penicillins  Home Medications   Prior to Admission medications   Medication Sig Start Date End Date Taking? Authorizing Provider  metFORMIN (GLUCOPHAGE) 1000 MG tablet Take 1,000 mg by mouth 2 (two) times daily with a meal.   Yes Historical Provider, MD  Albiglutide  (TANZEUM Anton Ruiz) Inject into the skin.    Historical Provider, MD  doxycycline (VIBRAMYCIN) 100 MG capsule Take 1 capsule (100 mg total) by mouth 2 (two) times daily. One po bid x 7 days 04/04/15   [place]il[place]Victoria Shatira Dobosz, PA-C  ibuprofen (ADVIL,MOTRIN) 800 MG tablet Take 1 tablet (800 mg total) by mouth every 8 (eight) hours as needed. 12/05/13   Susy Frizzleharles Sheldon, MD  naproxen (NAPROSYN) 500 MG tablet Take 1 tablet (500 mg total) by mouth 2 (two) times daily with a meal. As needed for pain 12/31/14   Linwood DibblesJon Knapp, MD  ondansetron (ZOFRAN) 4 MG tablet Take 1 tablet (4 mg total) by mouth every 6 (six) hours. 04/04/15   Oswaldo ConroyVictoria Meghen Akopyan, PA-C   BP 130/91 mmHg  Pulse 101  Temp(Src) 98.6 F (37 C) (Oral)  Resp 18  Ht 5\' 3"  (1.6 m)  Wt 240 lb (108.863 kg)  BMI 42.52 kg/m2  SpO2 99% Physical Exam  Constitutional: She appears well-developed and well-nourished. No distress.  HENT:  Head: Normocephalic and atraumatic.  Eyes: Conjunctivae and EOM are normal. Right eye exhibits no discharge. Left eye exhibits no discharge.  Cardiovascular: Normal rate and regular rhythm.   Pulmonary/Chest: Effort normal and breath sounds normal. No respiratory distress. She has no wheezes.  Abdominal: Soft. Bowel sounds are normal. She exhibits no distension. There is no tenderness.  Umbilical and left upper quadrant abdominal tenderness without rebound,  rigidity, guarding. CVA tenderness.  Genitourinary:  External genitalia without erythema, tenderness, lesions. Cervix erythematous and friable without lesions. Os closed. No CMT. Mild right and left adnexal tenderness. No adnexal masses appreciated. Minimal discharge without foul odor. Nursing tech in room for exam.  Neurological: She is alert. She exhibits normal muscle tone. Coordination normal.  Skin: Skin is warm and dry. She is not diaphoretic.  Nursing note and vitals reviewed.   ED Course  Procedures (including critical care time) Labs Review Labs Reviewed  WET PREP, GENITAL -  Abnormal; Notable for the following:    Trich, Wet Prep TOO NUMEROUS TO COUNT (*)    Clue Cells Wet Prep HPF POC FEW (*)    WBC, Wet Prep HPF POC TOO NUMEROUS TO COUNT (*)    All other components within normal limits  URINALYSIS, ROUTINE W REFLEX MICROSCOPIC - Abnormal; Notable for the following:    Color, Urine AMBER (*)    APPearance CLOUDY (*)    Specific Gravity, Urine >1.046 (*)    Glucose, UA >1000 (*)    Bilirubin Urine SMALL (*)    Ketones, ur 15 (*)    Protein, ur 30 (*)    Leukocytes, UA SMALL (*)    All other components within normal limits  COMPREHENSIVE METABOLIC PANEL - Abnormal; Notable for the following:    Sodium 133 (*)    Chloride 99 (*)    Glucose, Bld 378 (*)    Calcium 8.3 (*)    AST 123 (*)    ALT 113 (*)    All other components within normal limits  URINE MICROSCOPIC-ADD ON - Abnormal; Notable for the following:    Bacteria, UA FEW (*)    All other components within normal limits  CBG MONITORING, ED - Abnormal; Notable for the following:    Glucose-Capillary 338 (*)    All other components within normal limits  CBG MONITORING, ED - Abnormal; Notable for the following:    Glucose-Capillary 353 (*)    All other components within normal limits  CBG MONITORING, ED - Abnormal; Notable for the following:    Glucose-Capillary 234 (*)    All other components within normal limits  PREGNANCY, URINE  CBC WITH DIFFERENTIAL/PLATELET  LIPASE, BLOOD  RPR  HIV ANTIBODY (ROUTINE TESTING)  GC/CHLAMYDIA PROBE AMP ([place])    Imaging Review No results found.   EKG Interpretation None      Meds given in ED:  Medications  sodium chloride 0.9 % bolus 1,000 mL (0 mLs Intravenous Stopped 04/04/15 1553)  morphine 4 MG/ML injection 4 mg (4 mg Intravenous Given 04/04/15 1533)  metoCLOPramide (REGLAN) injection 10 mg (10 mg Intravenous Given 04/04/15 1533)  insulin aspart (novoLOG) injection 5 Units (5 Units Intravenous Given 04/04/15 1531)  cefTRIAXone (ROCEPHIN)  injection 250 mg (250 mg Intramuscular Given 04/04/15 1656)  metroNIDAZOLE (FLAGYL) tablet 2,000 mg (2,000 mg Oral Given 04/04/15 1654)  lidocaine (PF) (XYLOCAINE) 1 % injection (2.1 mLs  Given 04/04/15 1656)  sodium chloride 0.9 % bolus 1,000 mL (0 mLs Intravenous Stopped 04/04/15 1752)    New Prescriptions   DOXYCYCLINE (VIBRAMYCIN) 100 MG CAPSULE    Take 1 capsule (100 mg total) by mouth 2 (two) times daily. One po bid x 7 days   ONDANSETRON (ZOFRAN) 4 MG TABLET    Take 1 tablet (4 mg total) by mouth every 6 (six) hours.      MDM   Final diagnoses:  PID (acute pelvic inflammatory disease)  Hyperglycemia  Diffuse abdominal pain  Nausea vomiting and diarrhea  Elevated transaminase level  Trichomonal cervicitis   Patient presenting with nausea, vomiting, diarrhea as well as diffuse abdominal pain and vaginal discharge. Patient with initial tachycardia hyperglycemic.  Patient with diffuse abdominal pain without evidence of peritonitis. Pelvic exam without CMT mild right and left adnexal tenderness with significant discharge. Too numerous to count white cells few clue and trichomonas. We'll treat for PID and stress importance of taking entire antibiotic course. Patient also with hyperglycemia given insulin in the ED with improvement of her glucose level. Anion gap of 11. I doubt DKA. Pt with evidence of dehydration. She's been given 2 L fluid in ED. Pt tolerating fluids. Stress importance of following up with primary care. Repeat abdominal exam patient with minimal tenderness. Patient has nonsurgical abdomen. A shunt well appearing nontoxic nonseptic appearing and stable for discharge.  Discussed return precautions with patient. Discussed all results and patient verbalizes understanding and agrees with plan.  Case has been discussed with Dr. Madilyn Hook who agrees with the above plan and to discharge.       Oswaldo Conroy, PA-C 04/04/15 Rickey Primus  Tilden Fossa, MD 04/04/15 [phone]

## 2015-04-04 NOTE — Discharge Instructions (Signed)
Return to the emergency room with worsening of symptoms, new symptoms or with symptoms that are concerning , especially fevers, abdominal pain in one area, unable to keep down fluids, blood in stool or vomit, severe pain, you feel faint, lightheaded or pass out. Please take all of your antibiotics until finished!   You may develop abdominal discomfort or diarrhea from the antibiotic.  You may help offset this with probiotics which you can buy or get in yogurt. Do not eat  or take the probiotics until 2 hours after your antibiotic.  Please call your doctor for a followup appointment within 24-48 hours. When you talk to your doctor please let them know that you were seen in the emergency department and have them acquire all of your records so that they can discuss the findings with you and formulate a treatment plan to fully care for your new and ongoing problems. Read below information and follow recommendations. Pelvic Inflammatory Disease Pelvic inflammatory disease (PID) refers to an infection in some or all of the female organs. The infection can be in the uterus, ovaries, fallopian tubes, or the surrounding tissues in the pelvis. PID can cause abdominal or pelvic pain that comes on suddenly (acute pelvic pain). PID is a serious infection because it can lead to lasting (chronic) pelvic pain or the inability to have children (infertile).  CAUSES  The infection is often caused by the normal bacteria found in the vaginal tissues. PID may also be caused by an infection that is spread during sexual contact. PID can also occur following:   The birth of a baby.   A miscarriage.   An abortion.   Major pelvic surgery.   The use of an intrauterine device (IUD).   A sexual assault.  RISK FACTORS Certain factors can put a person at higher risk for PID, such as:  Being younger than 25 years.  Being sexually active at [place] age.  Usingnonbarrier contraception.  Havingmultiple sexual  partners.  Having sex with someone who has symptoms of a genital infection.  Using oral contraception. Other times, certain behaviors can increase the possibility of getting PID, such as:  Having sex during your period.  Using a vaginal douche.  Having an intrauterine device (IUD) in place. SYMPTOMS   Abdominal or pelvic pain.   Fever.   Chills.   Abnormal vaginal discharge.  Abnormal uterine bleeding.   Unusual pain shortly after finishing your period. DIAGNOSIS  Your caregiver will choose some of the following methods to make a diagnosis, such as:   Performinga physical exam and history. A pelvic exam typically reveals a very tender uterus and surrounding pelvis.   Ordering laboratory tests including a pregnancy test, blood tests, and urine test.  Orderingcultures of the vagina and cervix to check for a sexually transmitted infection (STI).  Performing an ultrasound.   Performing a laparoscopic procedure to look inside the pelvis.  TREATMENT   Antibiotic medicines may be prescribed and taken by mouth.   Sexual partners may be treated when the infection is caused by a sexually transmitted disease (STD).   Hospitalization may be needed to give antibiotics intravenously.  Surgery may be needed, but this is rare. It may take weeks until you are completely well. If you are diagnosed with PID, you should also be checked for human immunodeficiency virus (HIV). HOME CARE INSTRUCTIONS   If given, take your antibiotics as directed. Finish the medicine even if you start to feel better.   Only take over-the-counter  or prescription medicines for pain, discomfort, or fever as directed by your caregiver.   Do not have sexual intercourse until treatment is completed or as directed by your caregiver. If PID is confirmed, your recent sexual partner(s) will need treatment.   Keep your follow-up appointments. SEEK MEDICAL CARE IF:   You have increased or  abnormal vaginal discharge.   You need prescription medicine for your pain.   You vomit.   You cannot take your medicines.   Your partner has an STD.  SEEK IMMEDIATE MEDICAL CARE IF:   You have a fever.   You have increased abdominal or pelvic pain.   You have chills.   You have pain when you urinate.   You are not better after 72 hours following treatment.  MAKE SURE YOU:   Understand these instructions.  Will watch your condition.  Will get help right away if you are not doing well or get worse. Document Released: 11/13/2005 Document Revised: 03/10/2013 Document Reviewed: 11/09/2011 [facility] Patient Information 2015 [place], [place]. This information is not intended to replace advice given to you by your health care provider. Make sure you discuss any questions you have with your health care provider.

## 2015-04-04 NOTE — ED Notes (Signed)
Patient resting comfortably, no distress, refused benadryl

## 2015-04-04 NOTE — ED Notes (Signed)
Pt given ginger ale.

## 2015-04-05 LAB — GC/CHLAMYDIA PROBE AMP (~~LOC~~) NOT AT ARMC
Chlamydia: NEGATIVE
Neisseria Gonorrhea: NEGATIVE

## 2015-04-05 LAB — RPR: RPR Ser Ql: NONREACTIVE

## 2015-04-05 LAB — HIV ANTIBODY (ROUTINE TESTING W REFLEX): HIV Screen 4th Generation wRfx: NONREACTIVE

## 2015-04-16 ENCOUNTER — Emergency Department (HOSPITAL_BASED_OUTPATIENT_CLINIC_OR_DEPARTMENT_OTHER)
Admission: EM | Admit: 2015-04-16 | Discharge: 2015-04-16 | Disposition: A | Discharge status: Home / Self Care | Payer: Medicaid Other | Attending: Emergency Medicine | Admitting: Emergency Medicine

## 2015-04-16 ENCOUNTER — Encounter (HOSPITAL_BASED_OUTPATIENT_CLINIC_OR_DEPARTMENT_OTHER): Payer: Self-pay | Admitting: Family Medicine

## 2015-04-16 DIAGNOSIS — Z8719 Personal history of other diseases of the digestive system: Secondary | ICD-10-CM | POA: Insufficient documentation

## 2015-04-16 DIAGNOSIS — R51 Headache: Secondary | ICD-10-CM | POA: Insufficient documentation

## 2015-04-16 DIAGNOSIS — R040 Epistaxis: Secondary | ICD-10-CM

## 2015-04-16 DIAGNOSIS — Z3202 Encounter for pregnancy test, result negative: Secondary | ICD-10-CM | POA: Insufficient documentation

## 2015-04-16 DIAGNOSIS — R112 Nausea with vomiting, unspecified: Secondary | ICD-10-CM | POA: Insufficient documentation

## 2015-04-16 DIAGNOSIS — R739 Hyperglycemia, unspecified: Secondary | ICD-10-CM | POA: Insufficient documentation

## 2015-04-16 DIAGNOSIS — Z8619 Personal history of other infectious and parasitic diseases: Secondary | ICD-10-CM | POA: Insufficient documentation

## 2015-04-16 DIAGNOSIS — E669 Obesity, unspecified: Secondary | ICD-10-CM | POA: Insufficient documentation

## 2015-04-16 DIAGNOSIS — Z792 Long term (current) use of antibiotics: Secondary | ICD-10-CM | POA: Insufficient documentation

## 2015-04-16 DIAGNOSIS — L119 Acantholytic disorder, unspecified: Secondary | ICD-10-CM | POA: Insufficient documentation

## 2015-04-16 DIAGNOSIS — R519 Headache, unspecified: Secondary | ICD-10-CM

## 2015-04-16 DIAGNOSIS — Z794 Long term (current) use of insulin: Secondary | ICD-10-CM | POA: Insufficient documentation

## 2015-04-16 DIAGNOSIS — Z791 Long term (current) use of non-steroidal anti-inflammatories (NSAID): Secondary | ICD-10-CM | POA: Insufficient documentation

## 2015-04-16 DIAGNOSIS — Z8632 Personal history of gestational diabetes: Secondary | ICD-10-CM | POA: Insufficient documentation

## 2015-04-16 DIAGNOSIS — Z87891 Personal history of nicotine dependence: Secondary | ICD-10-CM | POA: Insufficient documentation

## 2015-04-16 DIAGNOSIS — Z79899 Other long term (current) drug therapy: Secondary | ICD-10-CM | POA: Insufficient documentation

## 2015-04-16 DIAGNOSIS — Z880 Allergy status to penicillin: Secondary | ICD-10-CM | POA: Insufficient documentation

## 2015-04-16 LAB — BASIC METABOLIC PANEL
Anion gap: 9 (ref 5–15)
BUN: 10 mg/dL (ref 6–20)
CO2: 25 mmol/L (ref 22–32)
Calcium: 8.5 mg/dL — ABNORMAL LOW (ref 8.9–10.3)
Chloride: 103 mmol/L (ref 101–111)
Creatinine, Ser: 0.55 mg/dL (ref 0.44–1.00)
GFR calc non Af Amer: >60 mL/min (ref >60)
Glucose, Bld: 216 mg/dL — ABNORMAL HIGH (ref 65–99)
Potassium: 3.5 mmol/L (ref 3.5–5.1)
Sodium: 137 mmol/L (ref 135–145)

## 2015-04-16 LAB — PREGNANCY, URINE: PREG TEST UR: NEGATIVE

## 2015-04-16 LAB — CBG MONITORING, ED: Glucose-Capillary: 174 mg/dL — ABNORMAL HIGH (ref 65–99)

## 2015-04-16 MED ORDER — ONDANSETRON HCL 4 MG PO TABS: 4.0000 mg | ORAL_TABLET | Freq: Three times a day (TID) | ORAL | Status: DC | PRN

## 2015-04-16 MED ORDER — BUTALBITAL-APAP-CAFFEINE 50-325-40 MG PO TABS: 1.0000 | ORAL_TABLET | Freq: Four times a day (QID) | ORAL | Status: DC | PRN

## 2015-04-16 MED ORDER — DIPHENHYDRAMINE HCL 50 MG/ML IJ SOLN
25.0000 mg | Freq: Once | INTRAMUSCULAR | Status: AC
  Administered 2015-04-16: 25 mg via INTRAVENOUS
  Filled 2015-04-16: qty 1

## 2015-04-16 MED ORDER — METOCLOPRAMIDE HCL 5 MG/ML IJ SOLN
10.0000 mg | Freq: Once | INTRAMUSCULAR | Status: AC
  Administered 2015-04-16: 10 mg via INTRAVENOUS
  Filled 2015-04-16: qty 2

## 2015-04-16 MED ORDER — SODIUM CHLORIDE 0.9 % IV BOLUS (SEPSIS)
1000.0000 mL | Freq: Once | INTRAVENOUS | Status: AC
  Administered 2015-04-16 (×2): 1000 mL via INTRAVENOUS

## 2015-04-16 NOTE — ED Notes (Signed)
Pt condition improved prior to d/c.  Discussed DM II with pt and family as well as provided printed literature on diet and basics of diabetes.  Encouraged nutritional consult and fu with PMD.  Pt and family verbalized understanding.

## 2015-04-16 NOTE — Discharge Instructions (Signed)
Please follow with your primary care doctor in the next 2 days for a check-up. They must obtain records for further management.   Do not hesitate to return to the Emergency Department for any new, worsening or concerning symptoms.   Humidify the air and apply bacitracin to the inside of the nose this will help prevent future nosebleeds. If the bleeding recurs spray Afrin into the nose and hold pressure for at least 10 minutes.

## 2015-04-16 NOTE — ED Notes (Signed)
Pt c/o headache x 1 wk. She also started taking insulin Novolog today and has been feeling hot and nauseated.

## 2015-04-16 NOTE — ED Notes (Signed)
Called to pt room.  Pt tearful, anxious and states she doesn't feel well.  Unable to completely described what she is feeling other than "I don't feel good".  She does report she feels restless.  Epistaxis noted and controlled with pressure and icepack.  Reassurance provided.  Family at bedside.

## 2015-04-16 NOTE — ED Provider Notes (Signed)
CSN: [id number]     Arrival date & time 04/16/15  1638 History   First MD Initiated Contact with Patient 04/16/15 1652     Chief Complaint  Patient presents with  . Headache  . Emesis     (Consider location/radiation/quality/duration/timing/severity/associated sxs/prior Treatment) HPI  Glenda Mann is a 29 y.o. female complaining of frontal headache which she's had for 7 days, associated with light sensitivity. Patient states that she will do insulin by her PCP today, she was given 1 dose in the office, she checked her blood sugar in the afternoon, it was 2:30, she administered 10 units of NovoLog and proceeded to eat. She had single episode of nonbloody, nonbilious, non-coffee ground emesis after she administered insulin. She also states that she felt flushed after using it. Patient denies sick contacts, fever, chills, change in vision, cervicalgia, thunderclap onset, exacerbation with exertion, exacerbation in the morning.   Past Medical History  Diagnosis Date  . Gestational diabetes   . Gallstones   . Shingles    Past Surgical History  Procedure Laterality Date  . Cholecystectomy    . Incise and drain abcess     Family History  Problem Relation Age of Onset  . Migraines Mother    History  Substance Use Topics  . Smoking status: Former Smoker -- 0.50 packs/day  . Smokeless tobacco: Never Used  . Alcohol Use: 2.0 oz/week    4 Standard drinks or equivalent per week   OB History    Gravida Para Term Preterm AB TAB SAB Ectopic Multiple Living   2 1             Review of Systems  10 systems reviewed and found to be negative, except as noted in the HPI.   Allergies  Bactrim; Penicillins; and Reglan  Home Medications   Prior to Admission medications   Medication Sig Start Date End Date Taking? Authorizing Provider  insulin aspart protamine- aspart (NOVOLOG MIX 70/30) (70-30) 100 UNIT/ML injection Inject 10 Units into the skin 2 (two) times daily after a meal.   Yes  Historical Provider, MD  Albiglutide (TANZEUM Ernest) Inject into the skin.    Historical Provider, MD  butalbital-acetaminophen-caffeine (FIORICET) 50-325-40 MG per tablet Take 1 tablet by mouth every 6 (six) hours as needed for headache. 04/16/15   Joni Reining Amazing Cowman, PA-C  doxycycline (VIBRAMYCIN) 100 MG capsule Take 1 capsule (100 mg total) by mouth 2 (two) times daily. One po bid x 7 days 04/04/15   [place], PA-C  ibuprofen (ADVIL,MOTRIN) 800 MG tablet Take 1 tablet (800 mg total) by mouth every 8 (eight) hours as needed. 12/05/13   Susy Frizzle, MD  metFORMIN (GLUCOPHAGE) 1000 MG tablet Take 1,000 mg by mouth 2 (two) times daily with a meal.    Historical Provider, MD  naproxen (NAPROSYN) 500 MG tablet Take 1 tablet (500 mg total) by mouth 2 (two) times daily with a meal. As needed for pain 12/31/14   Linwood Dibbles, MD  ondansetron (ZOFRAN) 4 MG tablet Take 1 tablet (4 mg total) by mouth every 8 (eight) hours as needed for nausea or vomiting. 04/16/15   Joni Reining Lahari Suttles, PA-C   BP 145/70 mmHg  Pulse 95  Temp(Src) 98.5 F (36.9 C) (Oral)  Resp 16  Ht  (1.651 m)  Wt 245 lb (111.131 kg)  BMI 40.77 kg/m2  SpO2 100%  LMP 04/15/2015 (Exact Date) Physical Exam  Constitutional: She is oriented to person, place, and time. She appears well-developed and  well-nourished. No distress.  Obese  HENT:  Head: Normocephalic and atraumatic.  Mouth/Throat: Oropharynx is clear and moist.  Scant dried blood to left nares  Eyes: Conjunctivae and EOM are normal. Pupils are equal, round, and reactive to light.  Neck: Normal range of motion. Neck supple.  Acantholysis nigracans  Cardiovascular: Normal rate, regular rhythm and intact distal pulses.   Pulmonary/Chest: Effort normal and breath sounds normal. No stridor. No respiratory distress. She has no wheezes. She has no rales. She exhibits no tenderness.  Abdominal: Soft. Bowel sounds are normal. She exhibits no distension and no mass. There is no  tenderness. There is no rebound and no guarding.  Musculoskeletal: Normal range of motion. She exhibits no edema or tenderness.  Neurological: She is alert and oriented to person, place, and time. No cranial nerve deficit.  II-Visual fields grossly intact. III/IV/VI-Extraocular movements intact.  Pupils reactive bilaterally. V/VII-Smile symmetric, equal eyebrow raise,  facial sensation intact VIII- Hearing grossly intact IX/X-Normal gag XI-bilateral shoulder shrug XII-midline tongue extension Motor: 5/5 bilaterally with normal tone and bulk Cerebellar: Normal finger-to-nose  and normal heel-to-shin test.   Romberg negative Ambulates with a coordinated gait   Psychiatric: She has a normal mood and affect.  Nursing note and vitals reviewed.   ED Course  Procedures (including critical care time) Labs Review Labs Reviewed  BASIC METABOLIC PANEL - Abnormal; Notable for the following:    Glucose, Bld 216 (*)    Calcium 8.5 (*)    All other components within normal limits  CBG MONITORING, ED - Abnormal; Notable for the following:    Glucose-Capillary 174 (*)    All other components within normal limits  PREGNANCY, URINE    Imaging Review No results found.   EKG Interpretation None      MDM   Final diagnoses:  Acute nonintractable headache, unspecified headache type  Anterior epistaxis  Hyperglycemia without ketosis  Non-intractable vomiting with nausea, vomiting of unspecified type   Filed Vitals:   04/16/15 1645 04/16/15 1805 04/16/15 1845  BP: 145/87 150/99 145/70  Pulse: 90 97 95  Temp: 98.5 F (36.9 C)    TempSrc: Oral    Resp: 18 18 16   Height: 5\' 5"  (1.651 m)    Weight: 245 lb (111.131 kg)    SpO2: 99% 98% 100%    Medications  sodium chloride 0.9 % bolus 1,000 mL (0 mLs Intravenous Stopped 04/16/15 1845)  metoCLOPramide (REGLAN) injection 10 mg (10 mg Intravenous Given 04/16/15 1746)  diphenhydrAMINE (BENADRYL) injection 25 mg (25 mg Intravenous Given  04/16/15 1746)    Glenda Mann is a pleasant 29 y.o. female presenting with frontal headache for 1 week. Patient also reports that she started taking NovoLog insulin today, she had an episode of emesis after she self-administered the insulin. Patient also reports a nosebleed which has resolved. Symptoms are related to the administration of insulin and have advised her she needs to continue to take her insulin.  Presentation of headacheis like pts typical HA and non concerning for [facility], ICH, Meningitis, or temporal arteritis. Pt is afebrile with no focal neuro deficits, nuchal rigidity, or change in vision. Pt is to follow up with PCP to discuss prophylactic medication. Pt verbalizes understanding and is agreeable with plan to dc.  After administration of Reglan patient reports feeling anxious. Her nosebleed has also returned. I was informed of this by the nurse, when I go back into the room to reevaluate the patient she is calm, resting comfortably and states  her symptoms have resolved. Blood glucoses elevated but patient has a normal anion gap. Her headache is improved in the ED, I have encouraged her to push fluids and stay well hydrated and she is to follow-up with her primary care doctor next week.  Evaluation does not show pathology that would require ongoing emergent intervention or inpatient treatment. Pt is hemodynamically stable and mentating appropriately. Discussed findings and plan with patient/guardian, who agrees with care plan. All questions answered. Return precautions discussed and outpatient follow up given.    Wynetta Emeryicole Payden Bonus, PA-C 04/16/15 1936  Glynn OctaveStephen Rancour, MD 04/16/15 2201

## 2015-05-07 ENCOUNTER — Emergency Department (HOSPITAL_BASED_OUTPATIENT_CLINIC_OR_DEPARTMENT_OTHER)
Admission: EM | Admit: 2015-05-07 | Discharge: 2015-05-07 | Disposition: A | Discharge status: Home / Self Care | Payer: Medicaid Other | Attending: Emergency Medicine | Admitting: Emergency Medicine

## 2015-05-07 ENCOUNTER — Encounter (HOSPITAL_BASED_OUTPATIENT_CLINIC_OR_DEPARTMENT_OTHER): Payer: Self-pay | Admitting: Emergency Medicine

## 2015-05-07 DIAGNOSIS — Z79899 Other long term (current) drug therapy: Secondary | ICD-10-CM | POA: Insufficient documentation

## 2015-05-07 DIAGNOSIS — Z87891 Personal history of nicotine dependence: Secondary | ICD-10-CM | POA: Insufficient documentation

## 2015-05-07 DIAGNOSIS — L509 Urticaria, unspecified: Secondary | ICD-10-CM

## 2015-05-07 DIAGNOSIS — R519 Headache, unspecified: Secondary | ICD-10-CM

## 2015-05-07 DIAGNOSIS — R51 Headache: Secondary | ICD-10-CM | POA: Insufficient documentation

## 2015-05-07 DIAGNOSIS — Z8632 Personal history of gestational diabetes: Secondary | ICD-10-CM | POA: Insufficient documentation

## 2015-05-07 DIAGNOSIS — Z8619 Personal history of other infectious and parasitic diseases: Secondary | ICD-10-CM | POA: Insufficient documentation

## 2015-05-07 DIAGNOSIS — Z3202 Encounter for pregnancy test, result negative: Secondary | ICD-10-CM | POA: Insufficient documentation

## 2015-05-07 DIAGNOSIS — Z794 Long term (current) use of insulin: Secondary | ICD-10-CM | POA: Insufficient documentation

## 2015-05-07 DIAGNOSIS — Z880 Allergy status to penicillin: Secondary | ICD-10-CM | POA: Insufficient documentation

## 2015-05-07 DIAGNOSIS — E1165 Type 2 diabetes mellitus with hyperglycemia: Secondary | ICD-10-CM | POA: Insufficient documentation

## 2015-05-07 DIAGNOSIS — G8929 Other chronic pain: Secondary | ICD-10-CM | POA: Insufficient documentation

## 2015-05-07 DIAGNOSIS — Z792 Long term (current) use of antibiotics: Secondary | ICD-10-CM | POA: Insufficient documentation

## 2015-05-07 DIAGNOSIS — R739 Hyperglycemia, unspecified: Secondary | ICD-10-CM

## 2015-05-07 DIAGNOSIS — Z8719 Personal history of other diseases of the digestive system: Secondary | ICD-10-CM | POA: Insufficient documentation

## 2015-05-07 LAB — BASIC METABOLIC PANEL
ANION GAP: 7 (ref 5–15)
BUN: 10 mg/dL (ref 6–20)
CALCIUM: 8.5 mg/dL — AB (ref 8.9–10.3)
CO2: 22 mmol/L (ref 22–32)
CREATININE: 0.47 mg/dL (ref 0.44–1.00)
Chloride: 103 mmol/L (ref 101–111)
GLUCOSE: 299 mg/dL — AB (ref 65–99)
Potassium: 3.9 mmol/L (ref 3.5–5.1)
SODIUM: 132 mmol/L — AB (ref 135–145)

## 2015-05-07 LAB — CBC WITH DIFFERENTIAL/PLATELET
BASOS ABS: 0 10*3/uL (ref 0.0–0.1)
Basophils Relative: 0 % (ref 0–1)
EOS ABS: 0.2 10*3/uL (ref 0.0–0.7)
EOS PCT: 1 % (ref 0–5)
HCT: 38.4 % (ref 36.0–46.0)
HEMOGLOBIN: 12.4 g/dL (ref 12.0–15.0)
LYMPHS ABS: 2.8 10*3/uL (ref 0.7–4.0)
LYMPHS PCT: 23 % (ref 12–46)
MCH: 27.2 pg (ref 26.0–34.0)
MCHC: 32.3 g/dL (ref 30.0–36.0)
MCV: 84.2 fL (ref 78.0–100.0)
MONOS PCT: 5 % (ref 3–12)
Monocytes Absolute: 0.6 10*3/uL (ref 0.1–1.0)
Neutro Abs: 8.5 10*3/uL — ABNORMAL HIGH (ref 1.7–7.7)
Neutrophils Relative %: 71 % (ref 43–77)
PLATELETS: 279 10*3/uL (ref 150–400)
RBC: 4.56 MIL/uL (ref 3.87–5.11)
RDW: 13.1 % (ref 11.5–15.5)
WBC: 12.2 10*3/uL — AB (ref 4.0–10.5)

## 2015-05-07 LAB — CBG MONITORING, ED: GLUCOSE-CAPILLARY: 264 mg/dL — AB (ref 65–99)

## 2015-05-07 LAB — HCG, SERUM, QUALITATIVE: PREG SERUM: NEGATIVE

## 2015-05-07 LAB — RAPID STREP SCREEN (MED CTR MEBANE ONLY): Streptococcus, Group A Screen (Direct): NEGATIVE

## 2015-05-07 MED ORDER — SODIUM CHLORIDE 0.9 % IV BOLUS (SEPSIS)
1000.0000 mL | Freq: Once | INTRAVENOUS | Status: AC
  Administered 2015-05-07: 1000 mL via INTRAVENOUS

## 2015-05-07 MED ORDER — KETOROLAC TROMETHAMINE 30 MG/ML IJ SOLN
30.0000 mg | Freq: Once | INTRAMUSCULAR | Status: AC
  Administered 2015-05-07: 30 mg via INTRAVENOUS
  Filled 2015-05-07: qty 1

## 2015-05-07 MED ORDER — DEXAMETHASONE SODIUM PHOSPHATE 10 MG/ML IJ SOLN
10.0000 mg | Freq: Once | INTRAMUSCULAR | Status: AC
  Administered 2015-05-07: 10 mg via INTRAVENOUS
  Filled 2015-05-07: qty 1

## 2015-05-07 MED ORDER — PROMETHAZINE HCL 25 MG/ML IJ SOLN
12.5000 mg | Freq: Once | INTRAMUSCULAR | Status: AC
  Administered 2015-05-07: 12.5 mg via INTRAVENOUS
  Filled 2015-05-07: qty 1

## 2015-05-07 MED ORDER — DIPHENHYDRAMINE HCL 50 MG/ML IJ SOLN
25.0000 mg | Freq: Once | INTRAMUSCULAR | Status: AC
  Administered 2015-05-07: 25 mg via INTRAVENOUS
  Filled 2015-05-07: qty 1

## 2015-05-07 NOTE — Discharge Instructions (Signed)
Follow-up with your primary Dr. to discuss your headache and to gain tighter control of your diabetes.   General Headache Without Cause A headache is pain or discomfort felt around the head or neck area. The specific cause of a headache may not be found. There are many causes and types of headaches. A few common ones are:  Tension headaches.  Migraine headaches.  Cluster headaches.  Chronic daily headaches. HOME CARE INSTRUCTIONS   Keep all follow-up appointments with your caregiver or any specialist referral.  Only take over-the-counter or prescription medicines for pain or discomfort as directed by your caregiver.  Lie down in a dark, quiet room when you have a headache.  Keep a headache journal to find out what may trigger your migraine headaches. For example, write down:  What you eat and drink.  How much sleep you get.  Any change to your diet or medicines.  Try massage or other relaxation techniques.  Put ice packs or heat on the head and neck. Use these 3 to 4 times per day for 15 to 20 minutes each time, or as needed.  Limit stress.  Sit up straight, and do not tense your muscles.  Quit smoking if you smoke.  Limit alcohol use.  Decrease the amount of caffeine you drink, or stop drinking caffeine.  Eat and sleep on a regular schedule.  Get 7 to 9 hours of sleep, or as recommended by your caregiver.  Keep lights dim if bright lights bother you and make your headaches worse. SEEK MEDICAL CARE IF:   You have problems with the medicines you were prescribed.  Your medicines are not working.  You have a change from the usual headache.  You have nausea or vomiting. SEEK IMMEDIATE MEDICAL CARE IF:   Your headache becomes severe.  You have a fever.  You have a stiff neck.  You have loss of vision.  You have muscular weakness or loss of muscle control.  You start losing your balance or have trouble walking.  You feel faint or pass out.  You have  severe symptoms that are different from your first symptoms. MAKE SURE YOU:   Understand these instructions.  Will watch your condition.  Will get help right away if you are not doing well or get worse. Document Released: 11/13/2005 Document Revised: 02/05/2012 Document Reviewed: 11/29/2011 [facility] Patient Information 2015 [place], [place]. This information is not intended to replace advice given to you by your health care provider. Make sure you discuss any questions you have with your health care provider.

## 2015-05-07 NOTE — ED Notes (Addendum)
Sore throat since yesterday.  Also, pt having hives all over her body x3 weeks.  Sts she called pmd yesterday about it and was told to take Zyrtec but she has not bought it yet. Sts she believes it is her new insulin causing it.  Pt sts that she also has a HA that she has been having x3 weeks - since the last time she was up here for HA.  Sts she has also been having nose bleeds today.

## 2015-05-07 NOTE — ED Provider Notes (Signed)
CSN: [id number]     Arrival date & time 05/07/15  0131 History   First MD Initiated Contact with Patient 05/07/15 0153     Chief Complaint  Patient presents with  . Sore Throat     (Consider location/radiation/quality/duration/timing/severity/associated sxs/prior Treatment) HPI Comments: Patient is a 29 year old female with history of morbid obesity, chronic headaches, diabetes. She presents for evaluation of headache "for years" but has been worse over the past several weeks. She also reports intermittent nosebleeds, sore throat, hoarse voice, and rash to her arms, legs, and torso. She was seen here for similar complaints 3 weeks ago, however no cause for this was found. There are no aggravating or alleviating factors. She denies any fevers or chills.  The history is provided by the patient.    Past Medical History  Diagnosis Date  . Gestational diabetes   . Gallstones   . Shingles    Past Surgical History  Procedure Laterality Date  . Cholecystectomy    . Incise and drain abcess     Family History  Problem Relation Age of Onset  . Migraines Mother    History  Substance Use Topics  . Smoking status: Former Smoker -- 0.50 packs/day  . Smokeless tobacco: Never Used  . Alcohol Use: 2.0 oz/week    4 Standard drinks or equivalent per week     Comment: drinks on weekends   OB History    Gravida Para Term Preterm AB TAB SAB Ectopic Multiple Living   2 1             Review of Systems  All other systems reviewed and are negative.     Allergies  Bactrim; Penicillins; and Reglan  Home Medications   Prior to Admission medications   Medication Sig Start Date End Date Taking? Authorizing Provider  insulin NPH-regular Human (NOVOLIN 70/30) (70-30) 100 UNIT/ML injection Inject 10 Units into the skin 2 (two) times daily with a meal.   Yes Historical Provider, MD  Albiglutide (TANZEUM [place]) Inject into the skin.    Historical Provider, MD  butalbital-acetaminophen-caffeine  (FIORICET) 50-325-40 MG per tablet Take 1 tablet by mouth every 6 (six) hours as needed for headache. 04/16/15   Joni Reining Pisciotta, PA-C  doxycycline (VIBRAMYCIN) 100 MG capsule Take 1 capsule (100 mg total) by mouth 2 (two) times daily. One po bid x 7 days 04/04/15   [place], PA-C  ibuprofen (ADVIL,MOTRIN) 800 MG tablet Take 1 tablet (800 mg total) by mouth every 8 (eight) hours as needed. 12/05/13   Susy Frizzle, MD  insulin aspart protamine- aspart (NOVOLOG MIX 70/30) (70-30) 100 UNIT/ML injection Inject 10 Units into the skin 2 (two) times daily after a meal.    Historical Provider, MD  metFORMIN (GLUCOPHAGE) 1000 MG tablet Take 1,000 mg by mouth 2 (two) times daily with a meal.    Historical Provider, MD  naproxen (NAPROSYN) 500 MG tablet Take 1 tablet (500 mg total) by mouth 2 (two) times daily with a meal. As needed for pain 12/31/14   Linwood Dibbles, MD  ondansetron (ZOFRAN) 4 MG tablet Take 1 tablet (4 mg total) by mouth every 8 (eight) hours as needed for nausea or vomiting. 04/16/15   Joni Reining Pisciotta, PA-C   BP 130/84 mmHg  Pulse 98  Temp(Src) 98.9 F (37.2 C) (Oral)  Resp 22  Ht 5\' 5"  (1.651 m)  Wt 245 lb (111.131 kg)  BMI 40.77 kg/m2  SpO2 100%  LMP 04/15/2015 (Exact Date) Physical Exam  Constitutional:  She is oriented to person, place, and time. She appears well-developed and well-nourished. No distress.  HENT:  Head: Normocephalic and atraumatic.  Mouth/Throat: Oropharynx is clear and moist.  Eyes: EOM are normal. Pupils are equal, round, and reactive to light.  Neck: Normal range of motion. Neck supple.  Cardiovascular: Normal rate and regular rhythm.  Exam reveals no gallop and no friction rub.   No murmur heard. Pulmonary/Chest: Effort normal and breath sounds normal. No respiratory distress. She has no wheezes.  Abdominal: Soft. Bowel sounds are normal. She exhibits no distension. There is no tenderness. There is no rebound and no guarding.  Musculoskeletal: Normal range  of motion. She exhibits no edema.  Lymphadenopathy:    She has no cervical adenopathy.  Neurological: She is alert and oriented to person, place, and time. No cranial nerve deficit. She exhibits normal muscle tone. Coordination normal.  Skin: Skin is warm and dry. She is not diaphoretic.  Nursing note and vitals reviewed.   ED Course  Procedures (including critical care time) Labs Review Labs Reviewed  CBG MONITORING, ED - Abnormal; Notable for the following:    Glucose-Capillary 264 (*)    All other components within normal limits  BASIC METABOLIC PANEL  CBC WITH DIFFERENTIAL/PLATELET  CBG MONITORING, ED    Imaging Review No results found.   EKG Interpretation None      MDM   Final diagnoses:  None    Patient with history of diabetes presents with multiple complaints, all of which are seemingly unrelated. She is given medication for her headache and is now resting very comfortably. Her neurologic exam is nonfocal and I see no indication for CT or LP. Laboratory studies are essentially unremarkable with exception of her sugar which is 299. I doubt this is far from her baseline. She will be discharged to home with instructions to follow-up with her primary doctor. Nothing today appears emergent.    Geoffery Lyons, MD 05/07/15 [phone]

## 2015-05-10 LAB — CULTURE, GROUP A STREP: Strep A Culture: NEGATIVE

## 2015-05-16 ENCOUNTER — Encounter (HOSPITAL_BASED_OUTPATIENT_CLINIC_OR_DEPARTMENT_OTHER): Payer: Self-pay | Admitting: Emergency Medicine

## 2015-05-16 ENCOUNTER — Emergency Department (HOSPITAL_BASED_OUTPATIENT_CLINIC_OR_DEPARTMENT_OTHER)
Admission: EM | Admit: 2015-05-16 | Discharge: 2015-05-16 | Disposition: A | Discharge status: Home / Self Care | Payer: Self-pay | Attending: Emergency Medicine | Admitting: Emergency Medicine

## 2015-05-16 DIAGNOSIS — B373 Candidiasis of vulva and vagina: Secondary | ICD-10-CM | POA: Insufficient documentation

## 2015-05-16 DIAGNOSIS — Z87891 Personal history of nicotine dependence: Secondary | ICD-10-CM | POA: Insufficient documentation

## 2015-05-16 DIAGNOSIS — Z8632 Personal history of gestational diabetes: Secondary | ICD-10-CM | POA: Insufficient documentation

## 2015-05-16 DIAGNOSIS — Z8719 Personal history of other diseases of the digestive system: Secondary | ICD-10-CM | POA: Insufficient documentation

## 2015-05-16 DIAGNOSIS — R739 Hyperglycemia, unspecified: Secondary | ICD-10-CM | POA: Insufficient documentation

## 2015-05-16 DIAGNOSIS — Z9049 Acquired absence of other specified parts of digestive tract: Secondary | ICD-10-CM | POA: Insufficient documentation

## 2015-05-16 DIAGNOSIS — Z3202 Encounter for pregnancy test, result negative: Secondary | ICD-10-CM | POA: Insufficient documentation

## 2015-05-16 DIAGNOSIS — Z794 Long term (current) use of insulin: Secondary | ICD-10-CM | POA: Insufficient documentation

## 2015-05-16 DIAGNOSIS — Z8619 Personal history of other infectious and parasitic diseases: Secondary | ICD-10-CM | POA: Insufficient documentation

## 2015-05-16 DIAGNOSIS — B379 Candidiasis, unspecified: Secondary | ICD-10-CM

## 2015-05-16 DIAGNOSIS — Z79899 Other long term (current) drug therapy: Secondary | ICD-10-CM | POA: Insufficient documentation

## 2015-05-16 DIAGNOSIS — Z792 Long term (current) use of antibiotics: Secondary | ICD-10-CM | POA: Insufficient documentation

## 2015-05-16 DIAGNOSIS — Z880 Allergy status to penicillin: Secondary | ICD-10-CM | POA: Insufficient documentation

## 2015-05-16 LAB — URINE MICROSCOPIC-ADD ON

## 2015-05-16 LAB — URINALYSIS, ROUTINE W REFLEX MICROSCOPIC
BILIRUBIN URINE: NEGATIVE
Glucose, UA: >1000 mg/dL — AB
Ketones, ur: NEGATIVE mg/dL
Nitrite: NEGATIVE
Protein, ur: NEGATIVE mg/dL
Specific Gravity, Urine: 1.04 — ABNORMAL HIGH (ref 1.005–1.030)
UROBILINOGEN UA: 0.2 mg/dL (ref 0.0–1.0)
pH: 6 (ref 5.0–8.0)

## 2015-05-16 LAB — I-STAT VENOUS BLOOD GAS, ED
Acid-base deficit: 1 mmol/L (ref 0.0–2.0)
BICARBONATE: 24 meq/L (ref 20.0–24.0)
O2 Saturation: 67 %
PCO2 VEN: 41.8 mmHg — AB (ref 45.0–50.0)
PH VEN: 7.368 — AB (ref 7.250–7.300)
PO2 VEN: 36 mmHg (ref 30.0–45.0)
TCO2: 25 mmol/L (ref 0–100)

## 2015-05-16 LAB — WET PREP, GENITAL: TRICH WET PREP: NONE SEEN

## 2015-05-16 LAB — BASIC METABOLIC PANEL
Anion gap: 9 (ref 5–15)
BUN: 9 mg/dL (ref 6–20)
CHLORIDE: 98 mmol/L — AB (ref 101–111)
CO2: 25 mmol/L (ref 22–32)
Calcium: 8.9 mg/dL (ref 8.9–10.3)
Creatinine, Ser: 0.69 mg/dL (ref 0.44–1.00)
GFR calc non Af Amer: >60 mL/min (ref >60)
Glucose, Bld: 548 mg/dL — ABNORMAL HIGH (ref 65–99)
POTASSIUM: 4.1 mmol/L (ref 3.5–5.1)
Sodium: 132 mmol/L — ABNORMAL LOW (ref 135–145)

## 2015-05-16 LAB — CBC WITH DIFFERENTIAL/PLATELET
Basophils Absolute: 0.1 10*3/uL (ref 0.0–0.1)
Basophils Relative: 0 % (ref 0–1)
EOS ABS: 0.2 10*3/uL (ref 0.0–0.7)
Eosinophils Relative: 1 % (ref 0–5)
HCT: 40.2 % (ref 36.0–46.0)
Hemoglobin: 12.8 g/dL (ref 12.0–15.0)
LYMPHS PCT: 22 % (ref 12–46)
Lymphs Abs: 2.7 10*3/uL (ref 0.7–4.0)
MCH: 27.3 pg (ref 26.0–34.0)
MCHC: 31.8 g/dL (ref 30.0–36.0)
MCV: 85.7 fL (ref 78.0–100.0)
Monocytes Absolute: 0.7 10*3/uL (ref 0.1–1.0)
Monocytes Relative: 6 % (ref 3–12)
Neutro Abs: 8.3 10*3/uL — ABNORMAL HIGH (ref 1.7–7.7)
Neutrophils Relative %: 71 % (ref 43–77)
Platelets: 311 10*3/uL (ref 150–400)
RBC: 4.69 MIL/uL (ref 3.87–5.11)
RDW: 12.9 % (ref 11.5–15.5)
WBC: 11.9 10*3/uL — AB (ref 4.0–10.5)

## 2015-05-16 LAB — CBG MONITORING, ED
GLUCOSE-CAPILLARY: 543 mg/dL — AB (ref 65–99)
Glucose-Capillary: 285 mg/dL — ABNORMAL HIGH (ref 65–99)

## 2015-05-16 LAB — PREGNANCY, URINE: PREG TEST UR: NEGATIVE

## 2015-05-16 MED ORDER — INSULIN REGULAR HUMAN 100 UNIT/ML IJ SOLN
10.0000 [IU] | Freq: Once | INTRAMUSCULAR | Status: AC
  Administered 2015-05-16: 10 [IU] via INTRAVENOUS
  Filled 2015-05-16: qty 1

## 2015-05-16 MED ORDER — FLUCONAZOLE 100 MG PO TABS: 100.0000 mg | ORAL_TABLET | Freq: Every day | ORAL | Status: AC

## 2015-05-16 MED ORDER — SODIUM CHLORIDE 0.9 % IV BOLUS (SEPSIS)
1000.0000 mL | Freq: Once | INTRAVENOUS | Status: AC
  Administered 2015-05-16: 1000 mL via INTRAVENOUS

## 2015-05-16 MED ORDER — SODIUM CHLORIDE 0.9 % IV SOLN: Freq: Once | INTRAVENOUS | Status: DC

## 2015-05-16 MED ORDER — ACETAMINOPHEN 325 MG PO TABS
650.0000 mg | ORAL_TABLET | Freq: Once | ORAL | Status: AC
  Administered 2015-05-16: 650 mg via ORAL
  Filled 2015-05-16: qty 2

## 2015-05-16 NOTE — ED Notes (Signed)
CBG results were 285 mg./dcltr.

## 2015-05-16 NOTE — ED Notes (Signed)
I placed patient on monitor per nurse request.

## 2015-05-16 NOTE — ED Provider Notes (Signed)
CSN: [id number]     Arrival date & time 05/16/15  1746 History   First MD Initiated Contact with Patient 05/16/15 1802     Chief Complaint  Patient presents with  . Hyperglycemia  . Vaginal Discharge     (Consider location/radiation/quality/duration/timing/severity/associated sxs/prior Treatment) HPI Comments: Pt comes in with 2 c/o. First c/o is elevation in her blood sugar.she states that it has been in the 500's today. Denies any illness. She states that her blood sugar is never below 300. She has dried mouth denies blurred vision. No vomiting or diarrhea. She states that she has had a vaginal discharge over the last month which she knows can happen with her  Sugar being elevated but she states that thinks her boyfriend has been cheating on her.she would like to be checked for stds  The history is provided by the patient. No language interpreter was used.    Past Medical History  Diagnosis Date  . Gestational diabetes   . Gallstones   . Shingles    Past Surgical History  Procedure Laterality Date  . Cholecystectomy    . Incise and drain abcess     Family History  Problem Relation Age of Onset  . Migraines Mother    History  Substance Use Topics  . Smoking status: Former Smoker -- 0.50 packs/day  . Smokeless tobacco: Never Used  . Alcohol Use: 2.0 oz/week    4 Standard drinks or equivalent per week     Comment: drinks on weekends   OB History    Gravida Para Term Preterm AB TAB SAB Ectopic Multiple Living   2 1             Review of Systems  All other systems reviewed and are negative.     Allergies  Bactrim; Penicillins; and Reglan  Home Medications   Prior to Admission medications   Medication Sig Start Date End Date Taking? Authorizing Provider  Albiglutide (TANZEUM [place]) Inject into the skin.    Historical Provider, MD  butalbital-acetaminophen-caffeine (FIORICET) 50-325-40 MG per tablet Take 1 tablet by mouth every 6 (six) hours as needed for headache.  04/16/15   Joni Reining Pisciotta, PA-C  doxycycline (VIBRAMYCIN) 100 MG capsule Take 1 capsule (100 mg total) by mouth 2 (two) times daily. One po bid x 7 days 04/04/15   [place], PA-C  ibuprofen (ADVIL,MOTRIN) 800 MG tablet Take 1 tablet (800 mg total) by mouth every 8 (eight) hours as needed. 12/05/13   Susy Frizzle, MD  insulin aspart protamine- aspart (NOVOLOG MIX 70/30) (70-30) 100 UNIT/ML injection Inject 10 Units into the skin 2 (two) times daily after a meal.    Historical Provider, MD  insulin NPH-regular Human (NOVOLIN 70/30) (70-30) 100 UNIT/ML injection Inject 10 Units into the skin 2 (two) times daily with a meal.    Historical Provider, MD  metFORMIN (GLUCOPHAGE) 1000 MG tablet Take 1,000 mg by mouth 2 (two) times daily with a meal.    Historical Provider, MD  naproxen (NAPROSYN) 500 MG tablet Take 1 tablet (500 mg total) by mouth 2 (two) times daily with a meal. As needed for pain 12/31/14   Linwood Dibbles, MD  ondansetron (ZOFRAN) 4 MG tablet Take 1 tablet (4 mg total) by mouth every 8 (eight) hours as needed for nausea or vomiting. 04/16/15   Joni Reining Pisciotta, PA-C   BP 127/83 mmHg  Pulse 101  Temp(Src) 98.7 F (37.1 C) (Oral)  Resp 16  Ht  (1.651 m)  Wt 248 lb (112.492 kg)  BMI 41.27 kg/m2  SpO2 100%  LMP 04/15/2015 (Exact Date) Physical Exam  Constitutional: She is oriented to person, place, and time. She appears well-developed and well-nourished.  Cardiovascular: Normal rate and regular rhythm.   Pulmonary/Chest: Effort normal and breath sounds normal.  Abdominal: Soft. Bowel sounds are normal. There is no tenderness.  Genitourinary:  Thick white discharge  Musculoskeletal: Normal range of motion.  Neurological: She is alert and oriented to person, place, and time.  Skin: Skin is warm and dry.  Nursing note and vitals reviewed.   ED Course  Procedures (including critical care time) Labs Review Labs Reviewed  CBC WITH DIFFERENTIAL/PLATELET - Abnormal; Notable for  the following:    WBC 11.9 (*)    Neutro Abs 8.3 (*)    All other components within normal limits  CBG MONITORING, ED - Abnormal; Notable for the following:    Glucose-Capillary 543 (*)    All other components within normal limits  I-STAT VENOUS BLOOD GAS, ED - Abnormal; Notable for the following:    pH, Ven 7.368 (*)    pCO2, Ven 41.8 (*)    All other components within normal limits  WET PREP, GENITAL  URINALYSIS, ROUTINE W REFLEX MICROSCOPIC (NOT AT [facility])  PREGNANCY, URINE  BASIC METABOLIC PANEL  GC/CHLAMYDIA PROBE AMP ([place]) NOT AT [facility]    Imaging Review No results found.   EKG Interpretation None      MDM   Final diagnoses:  Hyperglycemia  Candidiasis    Pt not in dka. Pt blood sugar better after fluids and insulin. Will treat for candidiasis. Other std cultures sent    Teressa Lower, NP 05/16/15 [id number]  Pricilla Loveless, MD 05/17/15 1505

## 2015-05-16 NOTE — Discharge Instructions (Signed)
Candida Infection A Candida infection (also called yeast, fungus, and Monilia infection) is an overgrowth of yeast that can occur anywhere on the body. A yeast infection commonly occurs in warm, moist body areas. Usually, the infection remains localized but can spread to become a systemic infection. A yeast infection may be a sign of a more severe disease such as diabetes, leukemia, or AIDS. A yeast infection can occur in both men and women. In women, Candida vaginitis is a vaginal infection. It is one of the most common causes of vaginitis. Men usually do not have symptoms or know they have an infection until other problems develop. Men may find out they have a yeast infection because their sex partner has a yeast infection. Uncircumcised men are more likely to get a yeast infection than circumcised men. This is because the uncircumcised glans is not exposed to air and does not remain as dry as that of a circumcised glans. Older adults may develop yeast infections around dentures. CAUSES  Women  Antibiotics.  Steroid medication taken for a long time.  Being overweight (obese).  Diabetes.  Poor immune condition.  Certain serious medical conditions.  Immune suppressive medications for organ transplant patients.  Chemotherapy.  Pregnancy.  Menstruation.  Stress and fatigue.  Intravenous drug use.  Oral contraceptives.  Wearing tight-fitting clothes in the crotch area.  Catching it from a sex partner who has a yeast infection.  Spermicide.  Intravenous, urinary, or other catheters. Men  Catching it from a sex partner who has a yeast infection.  Having oral or anal sex with a person who has the infection.  Spermicide.  Diabetes.  Antibiotics.  Poor immune system.  Medications that suppress the immune system.  Intravenous drug use.  Intravenous, urinary, or other catheters. SYMPTOMS  Women  Thick, white vaginal discharge.  Vaginal itching.  Redness and  swelling in and around the vagina.  Irritation of the lips of the vagina and perineum.  Blisters on the vaginal lips and perineum.  Painful sexual intercourse.  Low blood sugar (hypoglycemia).  Painful urination.  Bladder infections.  Intestinal problems such as constipation, indigestion, bad breath, bloating, increase in gas, diarrhea, or loose stools. Men  Men may develop intestinal problems such as constipation, indigestion, bad breath, bloating, increase in gas, diarrhea, or loose stools.  Dry, cracked skin on the penis with itching or discomfort.  Jock itch.  Dry, flaky skin.  Athlete's foot.  Hypoglycemia. DIAGNOSIS  Women  A history and an exam are performed.  The discharge may be examined under a microscope.  A culture may be taken of the discharge. Men  A history and an exam are performed.  Any discharge from the penis or areas of cracked skin will be looked at under the microscope and cultured.  Stool samples may be cultured. TREATMENT  Women  Vaginal antifungal suppositories and creams.  Medicated creams to decrease irritation and itching on the outside of the vagina.  Warm compresses to the perineal area to decrease swelling and discomfort.  Oral antifungal medications.  Medicated vaginal suppositories or cream for repeated or recurrent infections.  Wash and dry the irritation areas before applying the cream.  Eating yogurt with Lactobacillus may help with prevention and treatment.  Sometimes painting the vagina with gentian violet solution may help if creams and suppositories do not work. Men  Antifungal creams and oral antifungal medications.  Sometimes treatment must continue for 30 days after the symptoms go away to prevent recurrence. HOME CARE INSTRUCTIONS  Women  Use cotton underwear and avoid tight-fitting clothing.  Avoid colored, scented toilet paper and deodorant tampons or pads.  Do not douche.  Keep your diabetes  under control.  Finish all the prescribed medications.  Keep your skin clean and dry.  Consume milk or yogurt with Lactobacillus-active culture regularly. If you get frequent yeast infections and think that is what the infection is, there are over-the-counter medications that you can get. If the infection does not show healing in 3 days, talk to your caregiver.  Tell your sex partner you have a yeast infection. Your partner may need treatment also, especially if your infection does not clear up or recurs. Men  Keep your skin clean and dry.  Keep your diabetes under control.  Finish all prescribed medications.  Tell your sex partner that you have a yeast infection so he or she can be treated if necessary. SEEK MEDICAL CARE IF:   Your symptoms do not clear up or worsen in one week after treatment.  You have an oral temperature above 102 F (38.9 C).  You have trouble swallowing or eating for a prolonged time.  You develop blisters on and around your vagina.  You develop vaginal bleeding and it is not your menstrual period.  You develop abdominal pain.  You develop intestinal problems as mentioned above.  You get weak or light-headed.  You have painful or increased urination.  You have pain during sexual intercourse. MAKE SURE YOU:   Understand these instructions.  Will watch your condition.  Will get help right away if you are not doing well or get worse. Document Released: 12/21/2004 Document Revised: 03/30/2014 Document Reviewed: 04/04/2010 [facility] Patient Information 2015 [place], [place]. This information is not intended to replace advice given to you by your health care provider. Make sure you discuss any questions you have with your health care provider.  Blood Glucose Monitoring Monitoring your blood glucose (also know as blood sugar) helps you to manage your diabetes. It also helps you and your health care provider monitor your diabetes and determine how well  your treatment plan is working. WHY SHOULD YOU MONITOR YOUR BLOOD GLUCOSE?  It can help you understand how food, exercise, and medicine affect your blood glucose.  It allows you to know what your blood glucose is at any given moment. You can quickly tell if you are having low blood glucose (hypoglycemia) or high blood glucose (hyperglycemia).  It can help you and your health care provider know how to adjust your medicines.  It can help you understand how to manage an illness or adjust medicine for exercise. WHEN SHOULD YOU TEST? Your health care provider will help you decide how often you should check your blood glucose. This may depend on the type of diabetes you have, your diabetes control, or the types of medicines you are taking. Be sure to write down all of your blood glucose readings so that this information can be reviewed with your health care provider. See below for examples of testing times that your health care provider may suggest. Type 1 Diabetes  Test 4 times a day if you are in good control, using an insulin pump, or perform multiple daily injections.  If your diabetes is not well controlled or if you are sick, you may need to monitor more often.  It is a good idea to also monitor:  Before and after exercise.  Between meals and 2 hours after a meal.  Occasionally between 2:00 a.m. and 3:00  a.m. Type 2 Diabetes  It can vary with each person, but generally, if you are on insulin, test 4 times a day.  If you take medicines by mouth (orally), test 2 times a day.  If you are on a controlled diet, test once a day.  If your diabetes is not well controlled or if you are sick, you may need to monitor more often. HOW TO MONITOR YOUR BLOOD GLUCOSE Supplies Needed  Blood glucose meter.  Test strips for your meter. Each meter has its own strips. You must use the strips that go with your own meter.  A pricking needle (lancet).  A device that holds the lancet (lancing  device).  A journal or log book to write down your results. Procedure  Wash your hands with soap and water. Alcohol is not preferred.  Prick the side of your finger (not the tip) with the lancet.  Gently milk the finger until a small drop of blood appears.  Follow the instructions that come with your meter for inserting the test strip, applying blood to the strip, and using your blood glucose meter. Other Areas to Get Blood for Testing Some meters allow you to use other areas of your body (other than your finger) to test your blood. These areas are called alternative sites. The most common alternative sites are:  The forearm.  The thigh.  The back area of the lower leg.  The palm of the hand. The blood flow in these areas is slower. Therefore, the blood glucose values you get may be delayed, and the numbers are different from what you would get from your fingers. Do not use alternative sites if you think you are having hypoglycemia. Your reading will not be accurate. Always use a finger if you are having hypoglycemia. Also, if you cannot feel your lows (hypoglycemia unawareness), always use your fingers for your blood glucose checks. ADDITIONAL TIPS FOR GLUCOSE MONITORING  Do not reuse lancets.  Always carry your supplies with you.  All blood glucose meters have a 24-hour "hotline" number to call if you have questions or need help.  Adjust (calibrate) your blood glucose meter with a control solution after finishing a few boxes of strips. BLOOD GLUCOSE RECORD KEEPING It is a good idea to keep a daily record or log of your blood glucose readings. Most glucose meters, if not all, keep your glucose records stored in the meter. Some meters come with the ability to download your records to your home computer. Keeping a record of your blood glucose readings is especially helpful if you are wanting to look for patterns. Make notes to go along with the blood glucose readings because you might  forget what happened at that exact time. Keeping good records helps you and your health care provider to work together to achieve good diabetes management.  Document Released: 11/16/2003 Document Revised: 03/30/2014 Document Reviewed: 04/07/2013 [facility] Patient Information 2015 [place], [place]. This information is not intended to replace advice given to you by your health care provider. Make sure you discuss any questions you have with your health care provider.

## 2015-05-16 NOTE — ED Notes (Signed)
Patient states that her blood sugar is above 500 today, this is the highest that she had ever had, although she states that she is always high

## 2015-05-16 NOTE — ED Notes (Signed)
Pt d/c home with family- resources for diabetes management discussed

## 2015-05-18 LAB — RPR: RPR: NONREACTIVE

## 2015-05-18 LAB — HIV ANTIBODY (ROUTINE TESTING W REFLEX): HIV Screen 4th Generation wRfx: NONREACTIVE

## 2015-05-18 LAB — GC/CHLAMYDIA PROBE AMP (~~LOC~~) NOT AT ARMC
Chlamydia: NEGATIVE
NEISSERIA GONORRHEA: NEGATIVE

## 2015-08-16 ENCOUNTER — Encounter (HOSPITAL_BASED_OUTPATIENT_CLINIC_OR_DEPARTMENT_OTHER): Payer: Self-pay | Admitting: Emergency Medicine

## 2015-08-16 ENCOUNTER — Emergency Department (HOSPITAL_BASED_OUTPATIENT_CLINIC_OR_DEPARTMENT_OTHER)
Admission: EM | Admit: 2015-08-16 | Discharge: 2015-08-16 | Disposition: A | Discharge status: Home / Self Care | Payer: Medicaid Other | Attending: Emergency Medicine | Admitting: Emergency Medicine

## 2015-08-16 DIAGNOSIS — N760 Acute vaginitis: Secondary | ICD-10-CM | POA: Diagnosis not present

## 2015-08-16 DIAGNOSIS — E669 Obesity, unspecified: Secondary | ICD-10-CM | POA: Insufficient documentation

## 2015-08-16 DIAGNOSIS — B373 Candidiasis of vulva and vagina: Secondary | ICD-10-CM

## 2015-08-16 DIAGNOSIS — B9689 Other specified bacterial agents as the cause of diseases classified elsewhere: Secondary | ICD-10-CM

## 2015-08-16 DIAGNOSIS — Z880 Allergy status to penicillin: Secondary | ICD-10-CM | POA: Diagnosis not present

## 2015-08-16 DIAGNOSIS — Z8719 Personal history of other diseases of the digestive system: Secondary | ICD-10-CM | POA: Insufficient documentation

## 2015-08-16 DIAGNOSIS — Z711 Person with feared health complaint in whom no diagnosis is made: Secondary | ICD-10-CM

## 2015-08-16 DIAGNOSIS — Z8632 Personal history of gestational diabetes: Secondary | ICD-10-CM | POA: Diagnosis not present

## 2015-08-16 DIAGNOSIS — B3731 Acute candidiasis of vulva and vagina: Secondary | ICD-10-CM

## 2015-08-16 DIAGNOSIS — Z87891 Personal history of nicotine dependence: Secondary | ICD-10-CM | POA: Insufficient documentation

## 2015-08-16 DIAGNOSIS — Z113 Encounter for screening for infections with a predominantly sexual mode of transmission: Secondary | ICD-10-CM | POA: Insufficient documentation

## 2015-08-16 DIAGNOSIS — Z79899 Other long term (current) drug therapy: Secondary | ICD-10-CM | POA: Insufficient documentation

## 2015-08-16 DIAGNOSIS — N898 Other specified noninflammatory disorders of vagina: Secondary | ICD-10-CM | POA: Diagnosis present

## 2015-08-16 LAB — WET PREP, GENITAL: TRICH WET PREP: NONE SEEN

## 2015-08-16 LAB — URINALYSIS, ROUTINE W REFLEX MICROSCOPIC
Bilirubin Urine: NEGATIVE
HGB URINE DIPSTICK: NEGATIVE
KETONES UR: NEGATIVE mg/dL
Nitrite: NEGATIVE
PH: 5.5 (ref 5.0–8.0)
PROTEIN: NEGATIVE mg/dL
Specific Gravity, Urine: 1.044 — ABNORMAL HIGH (ref 1.005–1.030)
Urobilinogen, UA: 0.2 mg/dL (ref 0.0–1.0)

## 2015-08-16 LAB — URINE MICROSCOPIC-ADD ON

## 2015-08-16 LAB — PREGNANCY, URINE: PREG TEST UR: NEGATIVE

## 2015-08-16 MED ORDER — LIDOCAINE HCL (PF) 1 % IJ SOLN
INTRAMUSCULAR | Status: AC
  Administered 2015-08-16: 2.3 mL
  Filled 2015-08-16: qty 5

## 2015-08-16 MED ORDER — FLUCONAZOLE 50 MG PO TABS
ORAL_TABLET | ORAL | Status: AC
  Filled 2015-08-16: qty 1

## 2015-08-16 MED ORDER — AZITHROMYCIN 250 MG PO TABS
1000.0000 mg | ORAL_TABLET | Freq: Once | ORAL | Status: AC
  Administered 2015-08-16: 1000 mg via ORAL
  Filled 2015-08-16: qty 4

## 2015-08-16 MED ORDER — CEFTRIAXONE SODIUM 250 MG IJ SOLR
250.0000 mg | Freq: Once | INTRAMUSCULAR | Status: AC
  Administered 2015-08-16: 250 mg via INTRAMUSCULAR
  Filled 2015-08-16: qty 250

## 2015-08-16 MED ORDER — NYSTATIN 100000 UNIT/GM EX CREA: TOPICAL_CREAM | CUTANEOUS | Status: DC

## 2015-08-16 MED ORDER — FLUCONAZOLE 100 MG PO TABS
ORAL_TABLET | ORAL | Status: AC
  Filled 2015-08-16: qty 1

## 2015-08-16 MED ORDER — FLUCONAZOLE 150 MG PO TABS: 150.0000 mg | ORAL_TABLET | Freq: Once | ORAL | Status: DC

## 2015-08-16 MED ORDER — FLUCONAZOLE 100 MG PO TABS
150.0000 mg | ORAL_TABLET | Freq: Once | ORAL | Status: AC
  Administered 2015-08-16: 150 mg via ORAL

## 2015-08-16 MED ORDER — METRONIDAZOLE 500 MG PO TABS: 500.0000 mg | ORAL_TABLET | Freq: Two times a day (BID) | ORAL | Status: DC

## 2015-08-16 MED ORDER — DOXYCYCLINE HYCLATE 100 MG PO CAPS: 100.0000 mg | ORAL_CAPSULE | Freq: Two times a day (BID) | ORAL | Status: DC

## 2015-08-16 NOTE — Discharge Instructions (Signed)
Take doxycycline, Flagyl as prescribed for 1 week. Apply nystatin cream to your labia. Take the Diflucan tablet in 48 hours as you were given the first dose here in the emergency department. No sexual intercourse for 10 days after completing the antibiotic.  Bacterial Vaginosis Bacterial vaginosis is a vaginal infection that occurs when the normal balance of bacteria in the vagina is disrupted. It results from an overgrowth of certain bacteria. This is the most common vaginal infection in women of childbearing age. Treatment is important to prevent complications, especially in pregnant women, as it can cause a premature delivery. CAUSES  Bacterial vaginosis is caused by an increase in harmful bacteria that are normally present in smaller amounts in the vagina. Several different kinds of bacteria can cause bacterial vaginosis. However, the reason that the condition develops is not fully understood. RISK FACTORS Certain activities or behaviors can put you at an increased risk of developing bacterial vaginosis, including:  Having a new sex partner or multiple sex partners.  Douching.  Using an intrauterine device (IUD) for contraception. Women do not get bacterial vaginosis from toilet seats, bedding, swimming pools, or contact with objects around them. SIGNS AND SYMPTOMS  Some women with bacterial vaginosis have no signs or symptoms. Common symptoms include:  Grey vaginal discharge.  A fishlike odor with discharge, especially after sexual intercourse.  Itching or burning of the vagina and vulva.  Burning or pain with urination. DIAGNOSIS  Your health care provider will take a medical history and examine the vagina for signs of bacterial vaginosis. A sample of vaginal fluid may be taken. Your health care provider will look at this sample under a microscope to check for bacteria and abnormal cells. A vaginal pH test may also be done.  TREATMENT  Bacterial vaginosis may be treated with  antibiotic medicines. These may be given in the form of a pill or a vaginal cream. A second round of antibiotics may be prescribed if the condition comes back after treatment.  HOME CARE INSTRUCTIONS   Only take over-the-counter or prescription medicines as directed by your health care provider.  If antibiotic medicine was prescribed, take it as directed. Make sure you finish it even if you start to feel better.  Do not have sex until treatment is completed.  Tell all sexual partners that you have a vaginal infection. They should see their health care provider and be treated if they have problems, such as a mild rash or itching.  Practice safe sex by using condoms and only having one sex partner. SEEK MEDICAL CARE IF:   Your symptoms are not improving after 3 days of treatment.  You have increased discharge or pain.  You have a fever. MAKE SURE YOU:   Understand these instructions.  Will watch your condition.  Will get help right away if you are not doing well or get worse. FOR MORE INFORMATION  Centers for Disease Control and Prevention, Division of STD Prevention: SolutionApps.co.za American Sexual Health Association (ASHA): www.ashastd.org  Document Released: 11/13/2005 Document Revised: 09/03/2013 Document Reviewed: 06/25/2013 [facility] Patient Information 2015 [place], [place]. This information is not intended to replace advice given to you by your health care provider. Make sure you discuss any questions you have with your health care provider.  Vaginitis Vaginitis is an inflammation of the vagina. It is most often caused by a change in the normal balance of the bacteria and yeast that live in the vagina. This change in balance causes an overgrowth of certain bacteria  or yeast, which causes the inflammation. There are different types of vaginitis, but the most common types are:  Bacterial vaginosis.  Yeast infection (candidiasis).  Trichomoniasis vaginitis. This is a sexually  transmitted infection (STI).  Viral vaginitis.  Atropic vaginitis.  Allergic vaginitis. CAUSES  The cause depends on the type of vaginitis. Vaginitis can be caused by:  Bacteria (bacterial vaginosis).  Yeast (yeast infection).  A parasite (trichomoniasis vaginitis)  A virus (viral vaginitis).  Low hormone levels (atrophic vaginitis). Low hormone levels can occur during pregnancy, breastfeeding, or after menopause.  Irritants, such as bubble baths, scented tampons, and feminine sprays (allergic vaginitis). Other factors can change the normal balance of the yeast and bacteria that live in the vagina. These include:  Antibiotic medicines.  Poor hygiene.  Diaphragms, vaginal sponges, spermicides, birth control pills, and intrauterine devices (IUD).  Sexual intercourse.  Infection.  Uncontrolled diabetes.  A weakened immune system. SYMPTOMS  Symptoms can vary depending on the cause of the vaginitis. Common symptoms include:  Abnormal vaginal discharge.  The discharge is white, gray, or yellow with bacterial vaginosis.  The discharge is thick, white, and cheesy with a yeast infection.  The discharge is frothy and yellow or greenish with trichomoniasis.  A bad vaginal odor.  The odor is fishy with bacterial vaginosis.  Vaginal itching, pain, or swelling.  Painful intercourse.  Pain or burning when urinating. Sometimes, there are no symptoms. TREATMENT  Treatment will vary depending on the type of infection.   Bacterial vaginosis and trichomoniasis are often treated with antibiotic creams or pills.  Yeast infections are often treated with antifungal medicines, such as vaginal creams or suppositories.  Viral vaginitis has no cure, but symptoms can be treated with medicines that relieve discomfort. Your sexual partner should be treated as well.  Atrophic vaginitis may be treated with an estrogen cream, pill, suppository, or vaginal ring. If vaginal dryness  occurs, lubricants and moisturizing creams may help. You may be told to avoid scented soaps, sprays, or douches.  Allergic vaginitis treatment involves quitting the use of the product that is causing the problem. Vaginal creams can be used to treat the symptoms. HOME CARE INSTRUCTIONS   Take all medicines as directed by your caregiver.  Keep your genital area clean and dry. Avoid soap and only rinse the area with water.  Avoid douching. It can remove the healthy bacteria in the vagina.  Do not use tampons or have sexual intercourse until your vaginitis has been treated. Use sanitary pads while you have vaginitis.  Wipe from front to back. This avoids the spread of bacteria from the rectum to the vagina.  Let air reach your genital area.  Wear cotton underwear to decrease moisture buildup.  Avoid wearing underwear while you sleep until your vaginitis is gone.  Avoid tight pants and underwear or nylons without a cotton panel.  Take off wet clothing (especially bathing suits) as soon as possible.  Use mild, non-scented products. Avoid using irritants, such as:  Scented feminine sprays.  Fabric softeners.  Scented detergents.  Scented tampons.  Scented soaps or bubble baths.  Practice safe sex and use condoms. Condoms may prevent the spread of trichomoniasis and viral vaginitis. SEEK MEDICAL CARE IF:   You have abdominal pain.  You have a fever or persistent symptoms for more than 2-3 days.  You have a fever and your symptoms suddenly get worse. Document Released: 09/10/2007 Document Revised: 08/07/2012 Document Reviewed: 04/25/2012 [facility] Patient Information 2015 [place], [place]. This information  is not intended to replace advice given to you by your health care provider. Make sure you discuss any questions you have with your health care provider.

## 2015-08-16 NOTE — ED Notes (Signed)
Vaginal discharge with odor for months.  Pt concerned for STDs.  Some burning with urination.  Some pain with wiping.  Pt noted a boil on outside of pubis.

## 2015-08-16 NOTE — ED Provider Notes (Signed)
CSN: [id number]     Arrival date & time 08/16/15  4098 History   First MD Initiated Contact with Patient 08/16/15 [phone]     Chief Complaint  Patient presents with  . Vaginal Discharge     (Consider location/radiation/quality/duration/timing/severity/associated sxs/prior Treatment) HPI Comments: 29 year old female complaining of vaginal discharge 2 days. She is concerned about STDs as she believes her boyfriend sleeping with someone else. Her vagina feels very irritated and has pain "inside her lips". Reports both milky and clumpy white malodorous vaginal discharge. Admits to associated dysuria. Denies increased urinary frequency, urgency or hematuria. No vaginal bleeding.  Patient is a 29 y.o. female presenting with vaginal discharge. The history is provided by the patient.  Vaginal Discharge Quality:  White and milky (clumpy) Severity:  Moderate Onset quality:  Gradual Duration:  2 days Timing:  Constant Progression:  Worsening Chronicity:  New Relieved by:  Nothing Worsened by:  Urination Ineffective treatments:  None tried Associated symptoms: dysuria   Risk factors: unprotected sex     Past Medical History  Diagnosis Date  . Gestational diabetes   . Gallstones   . Shingles    Past Surgical History  Procedure Laterality Date  . Cholecystectomy    . Incise and drain abcess     Family History  Problem Relation Age of Onset  . Migraines Mother    Social History  Substance Use Topics  . Smoking status: Former Smoker -- 0.50 packs/day  . Smokeless tobacco: Never Used  . Alcohol Use: 2.0 oz/week    4 Standard drinks or equivalent per week     Comment: drinks on weekends   OB History    Gravida Para Term Preterm AB TAB SAB Ectopic Multiple Living   2 1             Review of Systems  Genitourinary: Positive for dysuria, vaginal discharge and vaginal pain.  All other systems reviewed and are negative.     Allergies  Bactrim; Penicillins; and Reglan  Home  Medications   Prior to Admission medications   Medication Sig Start Date End Date Taking? Authorizing Provider  glimepiride (AMARYL) 2 MG tablet Take 2 mg by mouth 2 (two) times daily.   Yes Historical Provider, MD  doxycycline (VIBRAMYCIN) 100 MG capsule Take 1 capsule (100 mg total) by mouth 2 (two) times daily. One po bid x 7 days 08/16/15   Kathrynn Speed, PA-C  fluconazole (DIFLUCAN) 150 MG tablet Take 1 tablet (150 mg total) by mouth once. 08/16/15   Robyn M Hess, PA-C  metFORMIN (GLUCOPHAGE) 1000 MG tablet Take 1,000 mg by mouth 2 (two) times daily with a meal.    Historical Provider, MD  metroNIDAZOLE (FLAGYL) 500 MG tablet Take 1 tablet (500 mg total) by mouth 2 (two) times daily. One po bid x 7 days 08/16/15   Kathrynn Speed, PA-C  nystatin cream (MYCOSTATIN) Apply to affected area 2 times daily 08/16/15   Robyn M Hess, PA-C   BP 157/86 mmHg  Pulse 102  Temp(Src) 98.1 F (36.7 C) (Oral)  Resp 18  Ht  (1.651 m)  Wt 245 lb (111.131 kg)  BMI 40.77 kg/m2  SpO2 100%  LMP 06/08/2015 Physical Exam  Constitutional: She is oriented to person, place, and time. She appears well-developed and well-nourished. No distress.  Obese.  HENT:  Head: Normocephalic and atraumatic.  Mouth/Throat: Oropharynx is clear and moist.  Eyes: Conjunctivae and EOM are normal.  Neck: Normal range of motion.  Neck supple.  Cardiovascular: Normal rate, regular rhythm and normal heart sounds.   Pulmonary/Chest: Effort normal and breath sounds normal. No respiratory distress.  Genitourinary: There is tenderness on the right labia. There is tenderness on the left labia. Uterus is not tender. Cervix exhibits no motion tenderness and no discharge. Right adnexum displays no tenderness. Left adnexum displays no tenderness. There is tenderness in the vagina. Vaginal discharge (copious, white, clumpy/milky) found.  Exam limited by body habitus. Labia minora inflamed. No rash.  Musculoskeletal: Normal range of motion.  She exhibits no edema.  Neurological: She is alert and oriented to person, place, and time. No sensory deficit.  Skin: Skin is warm and dry.  Psychiatric: She has a normal mood and affect. Her behavior is normal.  Nursing note and vitals reviewed.   ED Course  Procedures (including critical care time) Labs Review Labs Reviewed  WET PREP, GENITAL - Abnormal; Notable for the following:    Yeast Wet Prep HPF POC MODERATE (*)    Clue Cells Wet Prep HPF POC MODERATE (*)    WBC, Wet Prep HPF POC MANY (*)    All other components within normal limits  URINALYSIS, ROUTINE W REFLEX MICROSCOPIC (NOT AT [facility]) - Abnormal; Notable for the following:    APPearance CLOUDY (*)    Specific Gravity, Urine 1.044 (*)    Glucose, UA >1000 (*)    Leukocytes, UA MODERATE (*)    All other components within normal limits  URINE MICROSCOPIC-ADD ON - Abnormal; Notable for the following:    Squamous Epithelial / LPF FEW (*)    Bacteria, UA MANY (*)    All other components within normal limits  PREGNANCY, URINE  GC/CHLAMYDIA PROBE AMP ([place]) NOT AT [facility]    Imaging Review No results found. I have personally reviewed and evaluated these images and lab results as part of my medical decision-making.   EKG Interpretation None      MDM   Final diagnoses:  Yeast vaginitis  BV (bacterial vaginosis)  Concern about STD in female without diagnosis   Nontoxic appearing, NAD. Afebrile. Abdomen soft and nontender. She is concerned about STDs. Given Rocephin and azithromycin in the emergency department. GC/Chlamydia cultures pending. Wet prep significant for moderate yeast and clue cells along with many white blood cells. Treat with Flagyl, Diflucan and doxycycline. No CMT or adnexal tenderness concerning for PID. UA contaminated and the bacteria is most likely from the vaginal infection. Urine culture pending. No urinary symptoms. Infection care/precautions discussed. Nystatin cream for irritation of labia.  Stable for discharge. Return precautions given. Patient states understanding of treatment care plan and is agreeable.  Kathrynn Speed, PA-C 08/16/15 1040  Benjiman Core, MD 08/17/15 [phone]

## 2015-08-17 LAB — GC/CHLAMYDIA PROBE AMP (~~LOC~~) NOT AT ARMC
Chlamydia: NEGATIVE
NEISSERIA GONORRHEA: NEGATIVE

## 2015-08-17 LAB — URINE CULTURE

## 2015-11-10 DIAGNOSIS — E1142 Type 2 diabetes mellitus with diabetic polyneuropathy: Secondary | ICD-10-CM | POA: Insufficient documentation

## 2015-11-19 DIAGNOSIS — E669 Obesity, unspecified: Secondary | ICD-10-CM | POA: Insufficient documentation

## 2016-08-22 ENCOUNTER — Encounter (HOSPITAL_BASED_OUTPATIENT_CLINIC_OR_DEPARTMENT_OTHER): Payer: Self-pay | Admitting: Emergency Medicine

## 2016-08-22 ENCOUNTER — Emergency Department (HOSPITAL_BASED_OUTPATIENT_CLINIC_OR_DEPARTMENT_OTHER)
Admission: EM | Admit: 2016-08-22 | Discharge: 2016-08-22 | Disposition: A | Discharge status: Home / Self Care | Payer: Medicaid Other | Attending: Emergency Medicine | Admitting: Emergency Medicine

## 2016-08-22 DIAGNOSIS — L02211 Cutaneous abscess of abdominal wall: Secondary | ICD-10-CM | POA: Insufficient documentation

## 2016-08-22 DIAGNOSIS — Z7984 Long term (current) use of oral hypoglycemic drugs: Secondary | ICD-10-CM | POA: Insufficient documentation

## 2016-08-22 DIAGNOSIS — L0291 Cutaneous abscess, unspecified: Secondary | ICD-10-CM

## 2016-08-22 DIAGNOSIS — Z87891 Personal history of nicotine dependence: Secondary | ICD-10-CM | POA: Insufficient documentation

## 2016-08-22 DIAGNOSIS — E119 Type 2 diabetes mellitus without complications: Secondary | ICD-10-CM | POA: Insufficient documentation

## 2016-08-22 MED ORDER — NAPROXEN 250 MG PO TABS: 250.0000 mg | ORAL_TABLET | Freq: Two times a day (BID) | ORAL | 0 refills | Status: DC

## 2016-08-22 MED ORDER — CEPHALEXIN 500 MG PO CAPS: 500.0000 mg | ORAL_CAPSULE | Freq: Four times a day (QID) | ORAL | 0 refills | Status: AC

## 2016-08-22 MED ORDER — LIDOCAINE-EPINEPHRINE (PF) 2 %-1:200000 IJ SOLN
10.0000 mL | Freq: Once | INTRAMUSCULAR | Status: AC
  Administered 2016-08-22: 10 mL
  Filled 2016-08-22: qty 10

## 2016-08-22 NOTE — ED Provider Notes (Signed)
[facility] Provider Note   CSN: [id number] Arrival date & time: 08/22/16  1412     History   Chief Complaint Chief Complaint  Patient presents with  . Abscess    HPI Glenda Mann is a 30 y.o. female.  Patient is 30 yo F with PMH of diabetes, presenting to ED with chief complaint of a painful abscess above vaginal area that has been worsening over the past 4 days. Patient states she often shaves her pubic region, but has never had an abscess before. The patient tried hot compresses to the area, but it provided no relief. Denies recent fever, chills, abdominal pain, N/V/D, dysuria, or vaginal discharge.      Past Medical History:  Diagnosis Date  . Gallstones   . Gestational diabetes   . Shingles     Patient Active Problem List   Diagnosis Date Noted  . 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 CHOLECYSTECTOMY    . INCISE AND DRAIN ABCESS      OB History    Gravida Para Term Preterm AB Living   2 1           SAB TAB Ectopic Multiple Live Births                   Home Medications    Prior to Admission medications   Medication Sig Start Date End Date Taking? Authorizing Provider  glimepiride (AMARYL) 2 MG tablet Take 2 mg by mouth 2 (two) times daily.    Historical Provider, MD  metFORMIN (GLUCOPHAGE) 1000 MG tablet Take 1,000 mg by mouth 2 (two) times daily with a meal.    Historical Provider, MD  nystatin cream (MYCOSTATIN) Apply to affected area 2 times daily 08/16/15   Kathrynn Speed, PA-C    Family History Family History  Problem Relation Age of Onset  . Migraines Mother     Social History Social History  Substance Use Topics  . Smoking status: Former Smoker    Packs/day: 0.50  . Smokeless tobacco: Never Used  . Alcohol use 2.0 oz/week    4 Standard drinks or equivalent per week     Comment: drinks on weekends     Allergies   Bactrim [sulfamethoxazole-trimethoprim];  Penicillins; and Reglan [metoclopramide]   Review of Systems Review of Systems  All other systems reviewed and are negative.    Physical Exam Updated Vital Signs BP 108/60   Pulse 98   Temp 98.4 F (36.9 C) (Oral)   Resp 18   Ht 5\' 5"  (1.651 m)   Wt 106.1 kg   LMP 08/19/2016   SpO2 98%   BMI 38.94 kg/m   Physical Exam  Constitutional: No distress.  Obese female, lying in stretch, non-toxic appearance  HENT:  Head: Normocephalic and atraumatic.  Mouth/Throat: Oropharynx is clear and moist.  Eyes: Conjunctivae are normal.  Cardiovascular: Normal rate.   Pulmonary/Chest: Effort normal.  Abdominal: Soft. She exhibits no distension. There is no tenderness. There is no guarding.    Skin: Skin is warm and dry. No rash noted.     ED Treatments / Results  Labs (all labs ordered are listed, but only abnormal results are displayed) Labs Reviewed - No data to display  EKG  EKG Interpretation None       Radiology No results found.  Procedures .Marland KitchenIncision and Drainage Date/Time: 08/22/2016 3:52 PM Performed by: Lajean Silvius F Authorized by: DE  Marshia LyVILLIER II, Andre Gallego F   Consent:    Consent obtained:  Verbal   Consent given by:  Patient   Risks discussed:  Bleeding, incomplete drainage, pain and infection   Alternatives discussed:  No treatment Location:    Type:  Abscess   Size:  4 cm   Location:  Trunk   Trunk location:  Abdomen Pre-procedure details:    Skin preparation:  Betadine Anesthesia (see MAR for exact dosages):    Anesthesia method:  Local infiltration   Local anesthetic:  Lidocaine 2% WITH epi Procedure type:    Complexity:  Simple Procedure details:    Incision types:  Stab incision   Incision depth:  Dermal   Scalpel blade:  11   Wound management:  Irrigated with saline and extensive cleaning   Drainage:  Purulent   Drainage amount:  Copious   Wound treatment:  Wound left open   Packing materials:  None Post-procedure details:     Patient tolerance of procedure:  Tolerated well, no immediate complications   (including critical care time)  EMERGENCY DEPARTMENT US SOFT TISSUE INTERPRETATION "Study: Limited Ultrasound of the noted body part in comments below"  INDICATIONS: Soft tissue infection Multiple views of the body part are obtained with a multi-frequency linear probe  PERFORMED BY:  Myself  IMAGES ARCHIVED?: Yes  SIDE:Midline  BODY PART:Abdominal wall  FINDINGS: Abcess present and Cellulitis absent  LIMITATIONS:  Emergent Procedure  INTERPRETATION:  Abcess present and No cellulitis noted  COMMENT:  4 cm abscess located at mons pubis   Medications Ordered in ED Medications  lidocaine-EPINEPHrine (XYLOCAINE W/EPI) 2 %-1:200000 (PF) injection 10 mL (10 mLs Infiltration Given 08/22/16 1542)     Initial Impression / Assessment and Plan / ED Course  I have reviewed the triage vital signs and the nursing notes.  Pertinent labs & imaging results that were available during my care of the patient were reviewed by me and considered in my medical decision making (see chart for details).  Clinical Course   Patient has 4 cm abscess at mons pubis. No surrounding erythema or cellulitis. Ultrasound and I&D performed at bedside. Patient tolerated procedure well, is afebrile, and stable for d/c home.  Educated patient not to shave area, and to maintain glycemic control as diabetes can impair healing process. Prescription naprosyn for pain and keflex for antibiotic prophylaxis given pubic region. Patient agrees to plan, and instructions to keep area clean and dry. Advised to return to ED for any fever or signs of infection.  Final Clinical Impressions(s) / ED Diagnoses   Final diagnoses:  Abscess    New Prescriptions New Prescriptions   No medications on file         Jari PiggDaryl F de Villier II, [place] 08/22/16 1657    Charlynne Panderavid Hsienta Yao, MD 08/24/16 [phone]

## 2016-08-22 NOTE — ED Triage Notes (Signed)
Pt states she has an abscess right aabove her vaginal area

## 2017-01-08 ENCOUNTER — Encounter (HOSPITAL_BASED_OUTPATIENT_CLINIC_OR_DEPARTMENT_OTHER): Payer: Self-pay

## 2017-01-08 DIAGNOSIS — Z87891 Personal history of nicotine dependence: Secondary | ICD-10-CM | POA: Insufficient documentation

## 2017-01-08 DIAGNOSIS — J029 Acute pharyngitis, unspecified: Secondary | ICD-10-CM | POA: Insufficient documentation

## 2017-01-08 DIAGNOSIS — R05 Cough: Secondary | ICD-10-CM | POA: Insufficient documentation

## 2017-01-08 DIAGNOSIS — Z7984 Long term (current) use of oral hypoglycemic drugs: Secondary | ICD-10-CM | POA: Insufficient documentation

## 2017-01-08 DIAGNOSIS — E1165 Type 2 diabetes mellitus with hyperglycemia: Secondary | ICD-10-CM | POA: Insufficient documentation

## 2017-01-08 LAB — CBG MONITORING, ED: Glucose-Capillary: 350 mg/dL — ABNORMAL HIGH (ref 65–99)

## 2017-01-08 MED ORDER — ACETAMINOPHEN 325 MG PO TABS
650.0000 mg | ORAL_TABLET | Freq: Once | ORAL | Status: AC
  Administered 2017-01-08: 650 mg via ORAL
  Filled 2017-01-08: qty 2

## 2017-01-08 NOTE — ED Notes (Signed)
Pt tells nurse she feels like her sugar is high.  Pt states she normally runs high and is supposed to be on Invokana along with metformin, but she cannot afford the invokana, is working with her PCP to get referral for clinic.

## 2017-01-08 NOTE — ED Triage Notes (Signed)
Pt c/o headache, cough, runny nose, generalized body aches since last night, not relieved by one dose of nyquil at 1800.

## 2017-01-09 ENCOUNTER — Emergency Department (HOSPITAL_BASED_OUTPATIENT_CLINIC_OR_DEPARTMENT_OTHER)
Admission: EM | Admit: 2017-01-09 | Discharge: 2017-01-09 | Disposition: A | Discharge status: Home / Self Care | Payer: Medicaid Other | Attending: Emergency Medicine | Admitting: Emergency Medicine

## 2017-01-09 DIAGNOSIS — R739 Hyperglycemia, unspecified: Secondary | ICD-10-CM

## 2017-01-09 DIAGNOSIS — J111 Influenza due to unidentified influenza virus with other respiratory manifestations: Secondary | ICD-10-CM

## 2017-01-09 DIAGNOSIS — R69 Illness, unspecified: Secondary | ICD-10-CM

## 2017-01-09 LAB — CBC WITH DIFFERENTIAL/PLATELET
BASOS ABS: 0 10*3/uL (ref 0.0–0.1)
Basophils Relative: 0 %
EOS PCT: 4 %
Eosinophils Absolute: 0.5 10*3/uL (ref 0.0–0.7)
HCT: 37.6 % (ref 36.0–46.0)
Hemoglobin: 12.3 g/dL (ref 12.0–15.0)
LYMPHS PCT: 22 %
Lymphs Abs: 3 10*3/uL (ref 0.7–4.0)
MCH: 27.8 pg (ref 26.0–34.0)
MCHC: 32.7 g/dL (ref 30.0–36.0)
MCV: 84.9 fL (ref 78.0–100.0)
Monocytes Absolute: 0.6 10*3/uL (ref 0.1–1.0)
Monocytes Relative: 5 %
Neutro Abs: 9.4 10*3/uL — ABNORMAL HIGH (ref 1.7–7.7)
Neutrophils Relative %: 69 %
PLATELETS: 244 10*3/uL (ref 150–400)
RBC: 4.43 MIL/uL (ref 3.87–5.11)
RDW: 12.7 % (ref 11.5–15.5)
WBC: 13.6 10*3/uL — AB (ref 4.0–10.5)

## 2017-01-09 LAB — URINALYSIS, ROUTINE W REFLEX MICROSCOPIC
BILIRUBIN URINE: NEGATIVE
Glucose, UA: >=500 mg/dL — AB
KETONES UR: NEGATIVE mg/dL
LEUKOCYTES UA: NEGATIVE
Nitrite: NEGATIVE
PH: 6.5 (ref 5.0–8.0)
PROTEIN: NEGATIVE mg/dL
Specific Gravity, Urine: 1.041 — ABNORMAL HIGH (ref 1.005–1.030)

## 2017-01-09 LAB — BASIC METABOLIC PANEL
ANION GAP: 7 (ref 5–15)
BUN: 12 mg/dL (ref 6–20)
CALCIUM: 8.7 mg/dL — AB (ref 8.9–10.3)
CO2: 23 mmol/L (ref 22–32)
Chloride: 104 mmol/L (ref 101–111)
Creatinine, Ser: 0.65 mg/dL (ref 0.44–1.00)
GFR calc Af Amer: >60 mL/min (ref >60)
Glucose, Bld: 396 mg/dL — ABNORMAL HIGH (ref 65–99)
POTASSIUM: 3.9 mmol/L (ref 3.5–5.1)
SODIUM: 134 mmol/L — AB (ref 135–145)

## 2017-01-09 LAB — CBG MONITORING, ED
GLUCOSE-CAPILLARY: 359 mg/dL — AB (ref 65–99)
Glucose-Capillary: 280 mg/dL — ABNORMAL HIGH (ref 65–99)

## 2017-01-09 LAB — URINALYSIS, MICROSCOPIC (REFLEX)

## 2017-01-09 LAB — RAPID STREP SCREEN (MED CTR MEBANE ONLY): STREPTOCOCCUS, GROUP A SCREEN (DIRECT): NEGATIVE

## 2017-01-09 LAB — PREGNANCY, URINE: PREG TEST UR: NEGATIVE

## 2017-01-09 MED ORDER — INSULIN ASPART 100 UNIT/ML IV SOLN
7.0000 [IU] | Freq: Once | INTRAVENOUS | Status: AC
  Administered 2017-01-09: 7 [IU] via INTRAVENOUS
  Filled 2017-01-09: qty 1

## 2017-01-09 MED ORDER — OSELTAMIVIR PHOSPHATE 75 MG PO CAPS: 75.0000 mg | ORAL_CAPSULE | Freq: Two times a day (BID) | ORAL | 0 refills | Status: DC

## 2017-01-09 MED ORDER — ONDANSETRON HCL 4 MG/2ML IJ SOLN
4.0000 mg | Freq: Once | INTRAMUSCULAR | Status: AC
  Administered 2017-01-09: 4 mg via INTRAVENOUS
  Filled 2017-01-09: qty 2

## 2017-01-09 NOTE — Discharge Instructions (Signed)
Your diabetes puts you at increased risk from flu complications. Please get the flu shot every year.

## 2017-01-09 NOTE — ED Provider Notes (Signed)
[facility] Provider Note   CSN: [id number] Arrival date & time: 01/08/17  2259  By signing my name below, I, Rosario Adie, attest that this documentation has been prepared under the direction and in the presence of Dione Booze, MD. Electronically Signed: Rosario Adie, ED Scribe. 01/09/17. 12:52 AM.  History   Chief Complaint Chief Complaint  Patient presents with  . Influenza    flu symptoms   The history is provided by the patient. No language interpreter was used.   HPI Comments: Glenda Mann is a 31 y.o. female with a h/o DM, who presents to the Emergency Department complaining of persistent, gradually worsening sore throat beginning yesterday. She notes associated headache, chills, generalized myalgias, cough productive for yellow-green sputum, congestion, and nausea. She has been taking Nyquil at home without relief. She additionally notes that her CBG has been running high, with last check noted at 350. One of her family members has recently been sick with similar symptoms. Pt did not receive their seasonal influenza vaccination this year. Pt denies diaphoresis, fever, vomiting, or any other associated symptoms.   Past Medical History:  Diagnosis Date  . Gallstones   . Gestational diabetes   . Shingles    Patient Active Problem List   Diagnosis Date Noted  . 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 CHOLECYSTECTOMY    . INCISE AND DRAIN ABCESS     OB History    Gravida Para Term Preterm AB Living   2 1           SAB TAB Ectopic Multiple Live Births                 Home Medications    Prior to Admission medications   Medication Sig Start Date End Date Taking? Authorizing Provider  glimepiride (AMARYL) 2 MG tablet Take 2 mg by mouth 2 (two) times daily.    Historical Provider, MD  metFORMIN (GLUCOPHAGE) 1000 MG tablet Take 1,000 mg by mouth 2 (two) times daily with a meal.    Historical  Provider, MD  naproxen (NAPROSYN) 250 MG tablet Take 1 tablet (250 mg total) by mouth 2 (two) times daily with a meal. 08/22/16   Daryl F de Villier II, PA  nystatin cream (MYCOSTATIN) Apply to affected area 2 times daily 08/16/15   Kathrynn Speed, PA-C   Family History Family History  Problem Relation Age of Onset  . Migraines Mother    Social History Social History  Substance Use Topics  . Smoking status: Former Smoker    Packs/day: 0.50  . Smokeless tobacco: Never Used  . Alcohol use 2.0 oz/week    4 Standard drinks or equivalent per week     Comment: drinks on weekends   Allergies   Bactrim [sulfamethoxazole-trimethoprim]; Penicillins; and Reglan [metoclopramide]   Review of Systems Review of Systems  Constitutional: Positive for chills. Negative for diaphoresis and fever.  HENT: Positive for congestion and sore throat.   Respiratory: Positive for cough.   Gastrointestinal: Positive for nausea. Negative for vomiting.  Musculoskeletal: Positive for myalgias (generalized).  Neurological: Positive for headaches.  All other systems reviewed and are negative.  Physical Exam Updated Vital Signs BP 144/88 (BP Location: Left Arm)   Pulse 93   Temp 98.7 F (37.1 C) (Oral)   Resp 18   Ht 5\' 5"  (1.651 m)   Wt 237 lb (107.5 kg)   LMP 01/07/2017   SpO2  100%   BMI 39.44 kg/m   Physical Exam  Constitutional: She is oriented to person, place, and time. She appears well-developed and well-nourished.  HENT:  Head: Normocephalic and atraumatic.  Eyes: EOM are normal. Pupils are equal, round, and reactive to light.  Neck: Normal range of motion. Neck supple. No JVD present.  Cardiovascular: Normal rate, regular rhythm and normal heart sounds.   No murmur heard. Pulmonary/Chest: Effort normal and breath sounds normal. She has no wheezes. She has no rales. She exhibits no tenderness.  Abdominal: Soft. Bowel sounds are normal. She exhibits no distension and no mass. There is no  tenderness.  Musculoskeletal: Normal range of motion. She exhibits no edema.  Lymphadenopathy:    She has no cervical adenopathy.  Neurological: She is alert and oriented to person, place, and time. No cranial nerve deficit. She exhibits normal muscle tone. Coordination normal.  Skin: Skin is warm and dry. No rash noted.  Psychiatric: She has a normal mood and affect. Her behavior is normal. Judgment and thought content normal.  Nursing note and vitals reviewed.  ED Treatments / Results  DIAGNOSTIC STUDIES: Oxygen Saturation is 100% on RA, normal by my interpretation.   COORDINATION OF CARE: 12:52 AM-Discussed next steps with pt. Pt verbalized understanding and is agreeable with the plan.   Labs (all labs ordered are listed, but only abnormal results are displayed) Labs Reviewed  BASIC METABOLIC PANEL - Abnormal; Notable for the following:       Result Value   Sodium 134 (*)    Glucose, Bld 396 (*)    Calcium 8.7 (*)    All other components within normal limits  CBC WITH DIFFERENTIAL/PLATELET - Abnormal; Notable for the following:    WBC 13.6 (*)    Neutro Abs 9.4 (*)    All other components within normal limits  URINALYSIS, ROUTINE W REFLEX MICROSCOPIC - Abnormal; Notable for the following:    Specific Gravity, Urine 1.041 (*)    Glucose, UA >=500 (*)    Hgb urine dipstick SMALL (*)    All other components within normal limits  URINALYSIS, MICROSCOPIC (REFLEX) - Abnormal; Notable for the following:    Bacteria, UA FEW (*)    Squamous Epithelial / LPF 6-30 (*)    All other components within normal limits  CBG MONITORING, ED - Abnormal; Notable for the following:    Glucose-Capillary 350 (*)    All other components within normal limits  CBG MONITORING, ED - Abnormal; Notable for the following:    Glucose-Capillary 359 (*)    All other components within normal limits  RAPID STREP SCREEN (NOT AT [facility])  CULTURE, GROUP A STREP [facility])  PREGNANCY, URINE     Procedures Procedures   Medications Ordered in ED Medications  insulin aspart (novoLOG) injection 7 Units (not administered)  acetaminophen (TYLENOL) tablet 650 mg (650 mg Oral Given 01/08/17 2318)  ondansetron (ZOFRAN) injection 4 mg (4 mg Intravenous Given 01/09/17 0121)   Initial Impression / Assessment and Plan / ED Course  I have reviewed the triage vital signs and the nursing notes.  Pertinent lab results that were available during my care of the patient were reviewed by me and considered in my medical decision making (see chart for details).  Influenza-like illness. Because of patient's diabetes, she is at risk for complications of influenza so she is given a prescription for oseltamivir. Laboratory evaluation was done to make sure she is not having complications of her diabetes. Glucose is come  back 396 but with normal CO2 and normal anion gap showing no evidence of ketoacidosis. She is given a dose of NovoLog in the ED and discharged with instructions to take acetaminophen as needed for fever or aching and to monitor glucose at home. Follow-up with PCP in 3 days. Also, she is encouraged to get the influenza immunization every year.  Final Clinical Impressions(s) / ED Diagnoses   Final diagnoses:  Influenza-like illness  Hyperglycemia   New Prescriptions New Prescriptions   OSELTAMIVIR (TAMIFLU) 75 MG CAPSULE    Take 1 capsule (75 mg total) by mouth every 12 (twelve) hours.   I personally performed the services described in this documentation, which was scribed in my presence. The recorded information has been reviewed and is accurate.      Dione Booze, MD 01/09/17 [phone]

## 2017-01-09 NOTE — ED Notes (Signed)
Pt verbalizes understanding of d/c instructions and denies any further needs at this time. 

## 2017-01-11 LAB — CULTURE, GROUP A STREP (THRC)

## 2017-05-20 ENCOUNTER — Encounter (HOSPITAL_BASED_OUTPATIENT_CLINIC_OR_DEPARTMENT_OTHER): Payer: Self-pay | Admitting: Emergency Medicine

## 2017-05-20 ENCOUNTER — Emergency Department (HOSPITAL_BASED_OUTPATIENT_CLINIC_OR_DEPARTMENT_OTHER)
Admission: EM | Admit: 2017-05-20 | Discharge: 2017-05-20 | Disposition: A | Discharge status: Home / Self Care | Payer: Medicaid Other | Attending: Emergency Medicine | Admitting: Emergency Medicine

## 2017-05-20 DIAGNOSIS — E860 Dehydration: Secondary | ICD-10-CM | POA: Insufficient documentation

## 2017-05-20 DIAGNOSIS — Z7984 Long term (current) use of oral hypoglycemic drugs: Secondary | ICD-10-CM | POA: Insufficient documentation

## 2017-05-20 DIAGNOSIS — L02411 Cutaneous abscess of right axilla: Secondary | ICD-10-CM | POA: Insufficient documentation

## 2017-05-20 DIAGNOSIS — Z87891 Personal history of nicotine dependence: Secondary | ICD-10-CM | POA: Insufficient documentation

## 2017-05-20 DIAGNOSIS — Z79899 Other long term (current) drug therapy: Secondary | ICD-10-CM | POA: Insufficient documentation

## 2017-05-20 LAB — CBC WITH DIFFERENTIAL/PLATELET
BASOS ABS: 0 10*3/uL (ref 0.0–0.1)
BASOS PCT: 0 %
EOS ABS: 0.3 10*3/uL (ref 0.0–0.7)
EOS PCT: 2 %
HCT: 37.8 % (ref 36.0–46.0)
HEMOGLOBIN: 12.6 g/dL (ref 12.0–15.0)
Lymphocytes Relative: 22 %
Lymphs Abs: 3.3 10*3/uL (ref 0.7–4.0)
MCH: 28.3 pg (ref 26.0–34.0)
MCHC: 33.3 g/dL (ref 30.0–36.0)
MCV: 84.9 fL (ref 78.0–100.0)
Monocytes Absolute: 0.8 10*3/uL (ref 0.1–1.0)
Monocytes Relative: 6 %
NEUTROS PCT: 70 %
Neutro Abs: 10.7 10*3/uL — ABNORMAL HIGH (ref 1.7–7.7)
PLATELETS: 282 10*3/uL (ref 150–400)
RBC: 4.45 MIL/uL (ref 3.87–5.11)
RDW: 13.2 % (ref 11.5–15.5)
WBC: 15.1 10*3/uL — AB (ref 4.0–10.5)

## 2017-05-20 LAB — URINALYSIS, ROUTINE W REFLEX MICROSCOPIC
BILIRUBIN URINE: NEGATIVE
GLUCOSE, UA: NEGATIVE mg/dL
Hgb urine dipstick: NEGATIVE
Ketones, ur: 15 mg/dL — AB
LEUKOCYTES UA: NEGATIVE
NITRITE: NEGATIVE
PH: 6 (ref 5.0–8.0)
Protein, ur: NEGATIVE mg/dL
SPECIFIC GRAVITY, URINE: 1.031 — AB (ref 1.005–1.030)

## 2017-05-20 LAB — COMPREHENSIVE METABOLIC PANEL
ALBUMIN: 3.6 g/dL (ref 3.5–5.0)
ALK PHOS: 64 U/L (ref 38–126)
ALT: 28 U/L (ref 14–54)
AST: 21 U/L (ref 15–41)
Anion gap: 10 (ref 5–15)
BUN: 11 mg/dL (ref 6–20)
CALCIUM: 8.8 mg/dL — AB (ref 8.9–10.3)
CHLORIDE: 105 mmol/L (ref 101–111)
CO2: 22 mmol/L (ref 22–32)
CREATININE: 0.47 mg/dL (ref 0.44–1.00)
GFR calc non Af Amer: >60 mL/min (ref >60)
GLUCOSE: 101 mg/dL — AB (ref 65–99)
Potassium: 3.5 mmol/L (ref 3.5–5.1)
SODIUM: 137 mmol/L (ref 135–145)
Total Bilirubin: 0.5 mg/dL (ref 0.3–1.2)
Total Protein: 6.9 g/dL (ref 6.5–8.1)

## 2017-05-20 LAB — PREGNANCY, URINE: Preg Test, Ur: NEGATIVE

## 2017-05-20 LAB — CBG MONITORING, ED: GLUCOSE-CAPILLARY: 208 mg/dL — AB (ref 65–99)

## 2017-05-20 MED ORDER — OXYCODONE-ACETAMINOPHEN 5-325 MG PO TABS
1.0000 | ORAL_TABLET | Freq: Once | ORAL | Status: AC
  Administered 2017-05-20: 1 via ORAL
  Filled 2017-05-20: qty 1

## 2017-05-20 MED ORDER — LIDOCAINE-EPINEPHRINE 2 %-1:100000 IJ SOLN
20.0000 mL | Freq: Once | INTRAMUSCULAR | Status: AC
  Administered 2017-05-20: 20 mL
  Filled 2017-05-20: qty 1

## 2017-05-20 MED ORDER — CLINDAMYCIN HCL 150 MG PO CAPS
300.0000 mg | ORAL_CAPSULE | Freq: Once | ORAL | Status: AC
  Administered 2017-05-20: 300 mg via ORAL
  Filled 2017-05-20: qty 2

## 2017-05-20 MED ORDER — OXYCODONE-ACETAMINOPHEN 5-325 MG PO TABS: 1.0000 | ORAL_TABLET | ORAL | 0 refills | Status: DC | PRN

## 2017-05-20 MED ORDER — FLUCONAZOLE 150 MG PO TABS: 150.0000 mg | ORAL_TABLET | ORAL | 0 refills | Status: DC | PRN

## 2017-05-20 MED ORDER — CLINDAMYCIN HCL 300 MG PO CAPS: 300.0000 mg | ORAL_CAPSULE | Freq: Four times a day (QID) | ORAL | 0 refills | Status: DC

## 2017-05-20 MED ORDER — LIDOCAINE-EPINEPHRINE-TETRACAINE (LET) SOLUTION
3.0000 mL | Freq: Once | NASAL | Status: AC
  Administered 2017-05-20: 18:00:00 3 mL via TOPICAL
  Filled 2017-05-20: qty 3

## 2017-05-20 NOTE — ED Provider Notes (Signed)
[facility] Provider Note   CSN: [id number] Arrival date & time: 05/20/17  1505  By signing my name below, I, Cynda Acres, attest that this documentation has been prepared under the direction and in the presence of Loren Racer, MD. Electronically Signed: Cynda Acres, Scribe. 05/20/17. 5:12 PM.  History   Chief Complaint Chief Complaint  Patient presents with  . Abscess  . Hyperglycemia   HPI Comments: Glenda Mann is a 31 y.o. female with no pertinent past medical history, who presents to the Emergency Department complaining of gradual-onset "abscess" to the right axilla, which appeared 4 days ago. Patient states she has recurrent boils appear to different areas of the body. Patient is tolerating PO fluids and intake well. Patient reports associated pain, swelling, nausea, fatigue, and elevated blood sugar. Patient reports applying warm compresses with no relief. No medications taken prior to arrival. Nothing improves or worsens her pain. Patient denies any fever, chills, vomiting, or any additional symptoms.   The history is provided by the patient. No language interpreter was used.    Past Medical History:  Diagnosis Date  . Gallstones   . Gestational diabetes   . Shingles     Patient Active Problem List   Diagnosis Date Noted  . 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 CHOLECYSTECTOMY    . INCISE AND DRAIN ABCESS      OB History    Gravida Para Term Preterm AB Living   2 1           SAB TAB Ectopic Multiple Live Births                   Home Medications    Prior to Admission medications   Medication Sig Start Date End Date Taking? Authorizing Provider  glipiZIDE (GLUCOTROL) 10 MG tablet Take 10 mg by mouth daily before breakfast.   Yes [provider]  clindamycin (CLEOCIN) 300 MG capsule Take 1 capsule (300 mg total) by mouth 4 (four) times daily. X 7 days 05/20/17   Loren Racer,  MD  fluconazole (DIFLUCAN) 150 MG tablet Take 1 tablet (150 mg total) by mouth as needed (yeast infection). 05/20/17   Loren Racer, MD  glimepiride (AMARYL) 2 MG tablet Take 2 mg by mouth 2 (two) times daily.    [provider]  metFORMIN (GLUCOPHAGE) 1000 MG tablet Take 1,000 mg by mouth 2 (two) times daily with a meal.    [provider]  naproxen (NAPROSYN) 250 MG tablet Take 1 tablet (250 mg total) by mouth 2 (two) times daily with a meal. 08/22/16   de Villier, Daryl F II, PA  nystatin cream (MYCOSTATIN) Apply to affected area 2 times daily 08/16/15   Hess, Nada Boozer, PA-C  oseltamivir (TAMIFLU) 75 MG capsule Take 1 capsule (75 mg total) by mouth every 12 (twelve) hours. 01/09/17   Dione Booze, MD  oxyCODONE-acetaminophen (PERCOCET) 5-325 MG tablet Take 1-2 tablets by mouth every 4 (four) hours as needed. 05/20/17   Loren Racer, MD    Family History Family History  Problem Relation Age of Onset  . Migraines Mother     Social History Social History  Substance Use Topics  . Smoking status: Former Smoker    Packs/day: 0.50  . Smokeless tobacco: Never Used  . Alcohol use 2.0 oz/week    4 Standard drinks or equivalent per week     Comment: drinks on weekends  Allergies   Bactrim [sulfamethoxazole-trimethoprim]; Penicillins; and Reglan [metoclopramide]   Review of Systems Review of Systems  Constitutional: Positive for fatigue. Negative for chills and fever.  HENT: Negative for sore throat and trouble swallowing.   Respiratory: Negative for cough and shortness of breath.   Cardiovascular: Negative for chest pain, palpitations and leg swelling.  Gastrointestinal: Positive for nausea. Negative for abdominal pain, diarrhea and vomiting.  Musculoskeletal: Negative for back pain, myalgias, neck pain and neck stiffness.  Skin: Positive for color change. Negative for rash and wound.  Neurological: Negative for syncope, weakness, numbness and headaches.  All  other systems reviewed and are negative.    Physical Exam Updated Vital Signs BP 124/83 (BP Location: Right Arm)   Pulse 93   Temp 98.5 F (36.9 C) (Oral)   Resp 18   Ht 5\' 5"  (1.651 m)   Wt 111.6 kg (246 lb)   LMP 05/06/2017   SpO2 100%   BMI 40.94 kg/m   Physical Exam  Constitutional: She is oriented to person, place, and time. She appears well-developed and well-nourished. No distress.  HENT:  Head: Normocephalic and atraumatic.  Mouth/Throat: Oropharynx is clear and moist. No oropharyngeal exudate.  Eyes: EOM are normal. Pupils are equal, round, and reactive to light.  Neck: Normal range of motion. Neck supple.  Cardiovascular: Normal rate and regular rhythm.  Exam reveals no gallop and no friction rub.   No murmur heard. Pulmonary/Chest: Effort normal and breath sounds normal.  Abdominal: Soft. Bowel sounds are normal. There is no tenderness. There is no rebound and no guarding.  Musculoskeletal: Normal range of motion. She exhibits no edema or tenderness.  Neurological: She is alert and oriented to person, place, and time.  Skin: Skin is warm and dry. No rash noted. No erythema.  Patient with tender, fluctuant mass in the right axilla. Roughly 4 cm in diameter. Mild overlying erythema.  Psychiatric: She has a normal mood and affect. Her behavior is normal.  Nursing note and vitals reviewed.    ED Treatments / Results  DIAGNOSTIC STUDIES: Oxygen Saturation is 99% on RA, normal by my interpretation.    COORDINATION OF CARE: 5:12 PM Discussed treatment plan with pt at bedside and pt agreed to plan, which includes antibiotics and an I&D.   Labs (all labs ordered are listed, but only abnormal results are displayed) Labs Reviewed  CBC WITH DIFFERENTIAL/PLATELET - Abnormal; Notable for the following:       Result Value   WBC 15.1 (*)    Neutro Abs 10.7 (*)    All other components within normal limits  COMPREHENSIVE METABOLIC PANEL - Abnormal; Notable for the  following:    Glucose, Bld 101 (*)    Calcium 8.8 (*)    All other components within normal limits  URINALYSIS, ROUTINE W REFLEX MICROSCOPIC - Abnormal; Notable for the following:    APPearance CLOUDY (*)    Specific Gravity, Urine 1.031 (*)    Ketones, ur 15 (*)    All other components within normal limits  CBG MONITORING, ED - Abnormal; Notable for the following:    Glucose-Capillary 208 (*)    All other components within normal limits  PREGNANCY, URINE    EKG  EKG Interpretation None       Radiology No results found.  Procedures .Marland KitchenIncision and Drainage Date/Time: 05/20/2017 4:38 PM Performed by: Loren Racer Authorized by: Ranae Palms, Gabrial Poppell   Consent:    Risks discussed:  Bleeding Location:    Type:  Abscess   Size:  4   Location:  Upper extremity   Upper extremity location:  Arm   Arm location:  R upper arm Pre-procedure details:    Skin preparation:  Chloraprep Anesthesia (see MAR for exact dosages):    Anesthesia method:  Topical application and local infiltration   Topical anesthetic:  LET   Local anesthetic:  Lidocaine 2% WITH epi Procedure type:    Complexity:  Complex Procedure details:    Incision types:  Single straight   Incision depth:  Dermal   Scalpel blade:  11   Wound management:  Probed and deloculated   Drainage:  Purulent   Drainage amount:  Copious   Wound treatment:  Wound left open   Packing materials:  None Post-procedure details:    Patient tolerance of procedure:  Tolerated well, no immediate complications   (including critical care time)  Medications Ordered in ED Medications  lidocaine-EPINEPHrine-tetracaine (LET) solution (3 mLs Topical Given 05/20/17 1741)  lidocaine-EPINEPHrine (XYLOCAINE W/EPI) 2 %-1:100000 (with pres) injection 20 mL (20 mLs Infiltration Given by Other 05/20/17 1825)  clindamycin (CLEOCIN) capsule 300 mg (300 mg Oral Given 05/20/17 1911)  oxyCODONE-acetaminophen (PERCOCET/ROXICET) 5-325 MG per tablet 1  tablet (1 tablet Oral Given 05/20/17 1911)     Initial Impression / Assessment and Plan / ED Course  I have reviewed the triage vital signs and the nursing notes.  Pertinent labs & imaging results that were available during my care of the patient were reviewed by me and considered in my medical decision making (see chart for details).     Will start patient on antibiotics and given history of diabetes. She'll need to return to the emergency department in 2 days to have the wound reevaluated.  Final Clinical Impressions(s) / ED Diagnoses   Final diagnoses:  Abscess of right axilla  Dehydration    New Prescriptions Discharge Medication List as of 05/20/2017  7:02 PM    START taking these medications   Details  clindamycin (CLEOCIN) 300 MG capsule Take 1 capsule (300 mg total) by mouth 4 (four) times daily. X 7 days, Starting Sun 05/20/2017, Print    fluconazole (DIFLUCAN) 150 MG tablet Take 1 tablet (150 mg total) by mouth as needed (yeast infection)., Starting Sun 05/20/2017, Print    oxyCODONE-acetaminophen (PERCOCET) 5-325 MG tablet Take 1-2 tablets by mouth every 4 (four) hours as needed., Starting Sun 05/20/2017, Print       I personally performed the services described in this documentation, which was scribed in my presence. The recorded information has been reviewed and is accurate.       Loren RacerYelverton, Ronell Duffus, MD 05/23/17 [phone]

## 2017-05-20 NOTE — ED Notes (Signed)
D/cing the patient and she was very upset that she could not get 3 days off from work once again. The patient reports that when she was here that last time she was given as many dates as she wanted. The patient reports that even though she is advised not to go to work or drive until 12 am that patient states "well shit... I am going to work otherwise" Patient then reports "this wont effect me". Patient given several options for notes out and alternatives to going to work tonight, but did not want them because "I only get to be out if I am out for 3 days because of my point system"

## 2017-05-20 NOTE — ED Triage Notes (Signed)
Abscess under R axilla. Also states blood sugar has been running high for the past few days around 400.

## 2018-01-13 ENCOUNTER — Emergency Department (HOSPITAL_BASED_OUTPATIENT_CLINIC_OR_DEPARTMENT_OTHER)
Admission: EM | Admit: 2018-01-13 | Discharge: 2018-01-13 | Disposition: A | Discharge status: Other Acute Inpatient Hospital | Payer: BLUE CROSS/BLUE SHIELD

## 2018-01-21 ENCOUNTER — Other Ambulatory Visit: Payer: Self-pay

## 2018-01-21 ENCOUNTER — Emergency Department (HOSPITAL_BASED_OUTPATIENT_CLINIC_OR_DEPARTMENT_OTHER)
Admission: EM | Admit: 2018-01-21 | Discharge: 2018-01-21 | Disposition: A | Discharge status: Home / Self Care | Payer: BLUE CROSS/BLUE SHIELD | Attending: Physician Assistant | Admitting: Physician Assistant

## 2018-01-21 ENCOUNTER — Encounter (HOSPITAL_BASED_OUTPATIENT_CLINIC_OR_DEPARTMENT_OTHER): Payer: Self-pay | Admitting: *Deleted

## 2018-01-21 DIAGNOSIS — Z4801 Encounter for change or removal of surgical wound dressing: Secondary | ICD-10-CM | POA: Diagnosis present

## 2018-01-21 DIAGNOSIS — Z4800 Encounter for change or removal of nonsurgical wound dressing: Secondary | ICD-10-CM

## 2018-01-21 DIAGNOSIS — Z09 Encounter for follow-up examination after completed treatment for conditions other than malignant neoplasm: Secondary | ICD-10-CM

## 2018-01-21 NOTE — Discharge Instructions (Addendum)
Please continue to take your antibiotics as prescribed. You may have diarrhea from the antibiotics.  It is very important that you continue to take the antibiotics even if you get diarrhea unless a medical professional tells you that you may stop taking them.  If you stop too early the bacteria you are being treated for will become stronger and you may need different, more powerful antibiotics that have more side effects and worsening diarrhea.  Please stay well hydrated and consider probiotics as they may decrease the severity of your diarrhea.  Please be aware that if you take any hormonal contraception (birth control pills, nexplanon, the ring, etc) that your birth control will not work while you are taking antibiotics and you need to use back up protection as directed on the birth control medication information insert.   Tomorrow you may pull 4 inches of packing out and cut the excess, make sure you leave at least 2 inches of "tail".  Please make sure you are keeping the area under your breasts clean and dry.  Please return in 2 days for a wound re-check.  Please make sure to monitor your symptoms.  If you develop fevers or chills, or have any concern please return sooner.

## 2018-01-21 NOTE — ED Triage Notes (Signed)
Pt reports she was seen at [facility] and had abcess to her chest I&d'd. Pt requests wound check, packing is still in place.

## 2018-01-21 NOTE — ED Provider Notes (Signed)
[facility] EMERGENCY DEPARTMENT Provider Note   CSN: [id number] Arrival date & time: 01/21/18  1027     History   Chief Complaint Chief Complaint  Patient presents with  . Wound Check    HPI Glenda Mann is a 32 y.o. female who presents today for evaluation of a wound check.  She was seen at [facility] 5 days ago where she had an I&D of a chest abscess.  This was packed and she was instructed to follow-up with a surgeon or return in 2 days.  She reports that she has tried to return to [facility] multiple times, however they have weights that she feels are too long.  She reports that she has a follow-up appointment with a surgeon in 2 weeks.  She reports continued drainage from the area.  No fevers or chills, reports that her pain is better than when it first happened.  She reports that the packing is still in place.  HPI  Past Medical History:  Diagnosis Date  . Gallstones   . Gestational diabetes   . Shingles     Patient Active Problem List   Diagnosis Date Noted  . 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 CHOLECYSTECTOMY    . INCISE AND DRAIN ABCESS      OB History    Gravida Para Term Preterm AB Living   2 1           SAB TAB Ectopic Multiple Live Births                   Home Medications    Prior to Admission medications   Medication Sig Start Date End Date Taking? Authorizing Provider  clindamycin (CLEOCIN) 300 MG capsule Take 1 capsule (300 mg total) by mouth 4 (four) times daily. X 7 days 05/20/17   Loren RacerYelverton, David, MD  fluconazole (DIFLUCAN) 150 MG tablet Take 1 tablet (150 mg total) by mouth as needed (yeast infection). 05/20/17   Loren RacerYelverton, David, MD  glimepiride (AMARYL) 2 MG tablet Take 2 mg by mouth 2 (two) times daily.    [provider]  glipiZIDE (GLUCOTROL) 10 MG tablet Take 10 mg by mouth daily before breakfast.    [provider]  metFORMIN (GLUCOPHAGE)  1000 MG tablet Take 1,000 mg by mouth 2 (two) times daily with a meal.    [provider]  naproxen (NAPROSYN) 250 MG tablet Take 1 tablet (250 mg total) by mouth 2 (two) times daily with a meal. 08/22/16   de Villier, Daryl F II, PA  nystatin cream (MYCOSTATIN) Apply to affected area 2 times daily 08/16/15   Hess, Nada Boozerobyn M, PA-C  oseltamivir (TAMIFLU) 75 MG capsule Take 1 capsule (75 mg total) by mouth every 12 (twelve) hours. 01/09/17   Dione BoozeGlick, David, MD  oxyCODONE-acetaminophen (PERCOCET) 5-325 MG tablet Take 1-2 tablets by mouth every 4 (four) hours as needed. 05/20/17   Loren RacerYelverton, David, MD    Family History Family History  Problem Relation Age of Onset  . Migraines Mother     Social History Social History   Tobacco Use  . Smoking status: Former Smoker    Packs/day: 0.50  . Smokeless tobacco: Never Used  Substance Use Topics  . Alcohol use: Yes    Alcohol/week: 2.0 oz    Types: 4 Standard drinks or equivalent per week    Comment: drinks on weekends  . Drug use: No  Allergies   Bactrim [sulfamethoxazole-trimethoprim]; Penicillins; and Reglan [metoclopramide]   Review of Systems Review of Systems  Constitutional: Negative for chills, fatigue and fever.  HENT: Negative for congestion.   Eyes: Negative for visual disturbance.  Respiratory: Negative for chest tightness and shortness of breath.   Gastrointestinal: Negative for diarrhea, nausea and vomiting.  Skin: Positive for wound. Negative for color change and pallor.  Neurological: Negative for headaches.  Psychiatric/Behavioral: Negative for confusion.  All other systems reviewed and are negative.    Physical Exam Updated Vital Signs BP 132/90   Pulse 92   Temp 98.4 F (36.9 C) (Oral)   Resp 18   Ht 5\' 5"  (1.651 m)   Wt 112.9 kg (249 lb)   SpO2 100%   BMI 41.44 kg/m   Physical Exam  Constitutional: She is oriented to person, place, and time. She appears well-developed and well-nourished. No  distress.  HENT:  Head: Normocephalic and atraumatic.  Eyes: Conjunctivae are normal. Right eye exhibits no discharge. Left eye exhibits no discharge. No scleral icterus.  Neck: Normal range of motion. Neck supple.  Cardiovascular: Normal rate, regular rhythm and intact distal pulses.  Pulmonary/Chest: Effort normal. No stridor. No respiratory distress.  Abdominal: She exhibits no distension.  Musculoskeletal: Normal range of motion. She exhibits no edema or deformity.  Neurological: She is alert and oriented to person, place, and time. She exhibits normal muscle tone.  Skin: Skin is warm and dry. She is not diaphoretic.  There is a wound on the superior/medial margin of her right breast.  There is packing in place.  There is a collection of purulent material that has come out of the wound under the left breast.  There is obvious purulent drainage with out fluctuance.  Minimal surrounding erythema.  There is induration, worse superior to the wound.   Psychiatric: She has a normal mood and affect. Her behavior is normal.  Nursing note and vitals reviewed.    ED Treatments / Results  Labs (all labs ordered are listed, but only abnormal results are displayed) Labs Reviewed  AEROBIC CULTURE (SUPERFICIAL SPECIMEN)    EKG  EKG Interpretation None       Radiology No results found.  Procedures Wound packing Date/Time: 01/21/2018 6:21 PM Performed by: Cristina Gong, PA-C Authorized by: Cristina Gong, PA-C  Consent: Verbal consent obtained. Risks and benefits: risks, benefits and alternatives were discussed Consent given by: patient Patient tolerance: Patient tolerated the procedure well with no immediate complications Comments: The packing from the wound was removed in entirety.  The wound was probed, and re-packed.        Medications Ordered in ED Medications - No data to display   Initial Impression / Assessment and Plan / ED Course  I have reviewed the  triage vital signs and the nursing notes.  Pertinent labs & imaging results that were available during my care of the patient were reviewed by me and considered in my medical decision making (see chart for details).    Patient presents today for evaluation of wound check.  She had I&D with packing placed approximately 5 days ago and has not had any care since.  There was a collection of obviously purulent material under her right breast that it drained out of the wound.  Deep cultures were obtained and sent given the amount of purulent material from the wound.  The packing was removed and new packing was placed.  Patient was instructed to continue her antibiotics and  to return in 2 days for wound check.   Final Clinical Impressions(s) / ED Diagnoses   Final diagnoses:  Encounter for recheck of abscess following incision and drainage  Change or removal of wound packing    ED Discharge Orders    None       Cristina Gong, Cordelia Poche 01/21/18 1826    Abelino Derrick, MD 01/25/18 [phone]

## 2018-01-24 ENCOUNTER — Emergency Department (HOSPITAL_BASED_OUTPATIENT_CLINIC_OR_DEPARTMENT_OTHER)
Admission: EM | Admit: 2018-01-24 | Discharge: 2018-01-24 | Disposition: A | Discharge status: Home / Self Care | Payer: BLUE CROSS/BLUE SHIELD | Attending: Emergency Medicine | Admitting: Emergency Medicine

## 2018-01-24 ENCOUNTER — Other Ambulatory Visit: Payer: Self-pay

## 2018-01-24 ENCOUNTER — Encounter (HOSPITAL_BASED_OUTPATIENT_CLINIC_OR_DEPARTMENT_OTHER): Payer: Self-pay

## 2018-01-24 DIAGNOSIS — E1165 Type 2 diabetes mellitus with hyperglycemia: Secondary | ICD-10-CM | POA: Insufficient documentation

## 2018-01-24 DIAGNOSIS — Z7984 Long term (current) use of oral hypoglycemic drugs: Secondary | ICD-10-CM | POA: Diagnosis not present

## 2018-01-24 DIAGNOSIS — L0291 Cutaneous abscess, unspecified: Secondary | ICD-10-CM

## 2018-01-24 DIAGNOSIS — Z87891 Personal history of nicotine dependence: Secondary | ICD-10-CM | POA: Insufficient documentation

## 2018-01-24 DIAGNOSIS — N611 Abscess of the breast and nipple: Secondary | ICD-10-CM | POA: Diagnosis not present

## 2018-01-24 DIAGNOSIS — L02411 Cutaneous abscess of right axilla: Secondary | ICD-10-CM | POA: Diagnosis not present

## 2018-01-24 LAB — AEROBIC CULTURE  (SUPERFICIAL SPECIMEN)

## 2018-01-24 LAB — AEROBIC CULTURE W GRAM STAIN (SUPERFICIAL SPECIMEN)

## 2018-01-24 LAB — CBG MONITORING, ED: GLUCOSE-CAPILLARY: 282 mg/dL — AB (ref 65–99)

## 2018-01-24 NOTE — ED Triage Notes (Signed)
Pt states she was advised to return for a abscess recheck and packing removal-NAD-steady gait

## 2018-01-24 NOTE — ED Provider Notes (Signed)
[facility] EMERGENCY DEPARTMENT Provider Note   CSN: [id number] Arrival date & time: 01/24/18  1101     History   Chief Complaint Chief Complaint  Patient presents with  . Follow-up    HPI Glenda Mann is a 32 y.o. female.  Presents for wound check and to have packing removed from abscess drained at right breast at another emergency department 1 week ago.  She was placed on clindamycin.  She also has a small abscess immediately anterior to right axilla which has been present for approximately a week.  Without treatment.  She states abscess her breast is healing well And much less painful than before it was drained.  She reports that her blood sugar was checked at her endocrinologist's office earlier today which was noted to be 281.  She is eaten a donut today.  Her endocrinologist is supplied her with a meal plan.  Presently feels mildly fatigued.  No other associated symptoms HPI  Past Medical History:  Diagnosis Date  . Gallstones   . Gestational diabetes   . Shingles   Type 2 diabetes  Patient Active Problem List   Diagnosis Date Noted  . 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 CHOLECYSTECTOMY    . INCISE AND DRAIN ABCESS      OB History    Gravida Para Term Preterm AB Living   2 1           SAB TAB Ectopic Multiple Live Births                   Home Medications    Prior to Admission medications   Medication Sig Start Date End Date Taking? Authorizing Provider  clindamycin (CLEOCIN) 300 MG capsule Take 1 capsule (300 mg total) by mouth 4 (four) times daily. X 7 days 05/20/17   Loren RacerYelverton, David, MD  glipiZIDE (GLUCOTROL) 10 MG tablet Take 10 mg by mouth daily before breakfast.    [provider]  metFORMIN (GLUCOPHAGE) 1000 MG tablet Take 1,000 mg by mouth 2 (two) times daily with a meal.    [provider]  oxyCODONE-acetaminophen (PERCOCET) 5-325 MG tablet Take 1-2 tablets by  mouth every 4 (four) hours as needed. 05/20/17   Loren RacerYelverton, David, MD    Family History Family History  Problem Relation Age of Onset  . Migraines Mother     Social History Social History   Tobacco Use  . Smoking status: Former Smoker    Packs/day: 0.50  . Smokeless tobacco: Never Used  Substance Use Topics  . Alcohol use: Yes    Alcohol/week: 2.0 oz    Types: 4 Standard drinks or equivalent per week    Comment: drinks on weekends  . Drug use: No     Allergies   Bactrim [sulfamethoxazole-trimethoprim]; Penicillins; and Reglan [metoclopramide]   Review of Systems Review of Systems  Constitutional: Negative.   Skin: Positive for wound.  Allergic/Immunologic: Positive for immunocompromised state.     Physical Exam Updated Vital Signs BP 120/82 (BP Location: Left Arm)   Pulse 98   Temp 98.9 F (37.2 C) (Oral)   Resp 18   Ht 5\' 5"  (1.651 m)   Wt 108 kg (238 lb 1.6 oz)   SpO2 99%   BMI 39.62 kg/m   Physical Exam  Constitutional: She appears well-developed and well-nourished.  HENT:  Head: Normocephalic and atraumatic.  Eyes: Conjunctivae are normal. Pupils are equal, round, and reactive  to light.  Neck: Neck supple. No tracheal deviation present. No thyromegaly present.  Cardiovascular: Normal rate and regular rhythm.  No murmur heard. Pulmonary/Chest: Effort normal and breath sounds normal.  Abdominal: Soft. Bowel sounds are normal. She exhibits no distension. There is no tenderness.  Musculoskeletal: Normal range of motion. She exhibits no edema or tenderness.  Neurological: She is alert. Coordination normal.  Skin: Skin is warm and dry. No rash noted.  Right breast inferior aspect with packed wound.  Surrounding area is not fluctuant red, or tender.  There is a 2 cm fluctuant area at right axilla immediately anterior to anterior axillary line, area minimally tender.  Psychiatric: She has a normal mood and affect.  Nursing note and vitals  reviewed.    ED Treatments / Results  Labs (all labs ordered are listed, but only abnormal results are displayed) Labs Reviewed  CBG MONITORING, ED - Abnormal; Notable for the following components:      Result Value   Glucose-Capillary 282 (*)    All other components within normal limits    EKG  EKG Interpretation None       Radiology No results found.  Procedures Procedures (including critical care time)  Medications Ordered in ED Medications - No data to display   Initial Impression / Assessment and Plan / ED Course  I have reviewed the triage vital signs and the nursing notes.  Pertinent labs & imaging results that were available during my care of the patient were reviewed by me and considered in my medical decision making (see chart for details).     I offered incision and drainage of abscess at right axilla, which patient declined.  Packing was pulled from breast abscess.  Plan home observation.  Continue clindamycin. Wound check 3 or 4 days at PMD or urgent care center Tylenol for pain Final Clinical Impressions(s) / ED Diagnoses  Diagnoses #1 abscess right axilla #2 healing abscess of breast #3 hyperglycemia Final diagnoses:  None    ED Discharge Orders    None       Doug Sou, MD 01/24/18 1437

## 2018-01-24 NOTE — Discharge Instructions (Signed)
Standard of the warm shower and let the warm water hit the area on your breast and at right armpit 4 times daily for 30 minutes at a time.  Or you can hold a warm damp cloth over those areas 4 times daily for 30 minutes at a time.  You should get your wound checked by your doctor or at an urgent care center in 3 or 4 days.  Take Tylenol as directed for pain.  Blood sugar today was 282, mildly elevated.  Follow the diet prescribed by your endocrinologist.

## 2018-01-25 ENCOUNTER — Telehealth: Payer: Self-pay

## 2018-01-25 NOTE — Progress Notes (Signed)
ED Antimicrobial Stewardship Positive Culture Follow Up   Glenda Mann is an 32 y.o. female who presented to [facility] on 01/21/2018 with a chief complaint of  Chief Complaint  Patient presents with  . Wound Check    Recent Results (from the past 720 hour(s))  Aerobic Culture (superficial specimen)     Status: None   Collection Time: 01/21/18 11:30 AM  Result Value Ref Range Status   Specimen Description ABSCESS CHEST  Final   Special Requests NONE  Final   Gram Stain   Final    MODERATE WBC PRESENT, PREDOMINANTLY PMN MODERATE GRAM POSITIVE COCCI Performed at [facility], [address] [place], [place] [address]    Culture   Final    MODERATE VIRIDANS STREPTOCOCCUS FEW METHICILLIN RESISTANT STAPHYLOCOCCUS AUREUS    Report Status 01/24/2018 FINAL  Final   Organism ID, Bacteria METHICILLIN RESISTANT STAPHYLOCOCCUS AUREUS  Final      Susceptibility   Methicillin resistant staphylococcus aureus - MIC*    CIPROFLOXACIN >=8 RESISTANT Resistant     ERYTHROMYCIN >=8 RESISTANT Resistant     GENTAMICIN <=0.5 SENSITIVE Sensitive     OXACILLIN >=4 RESISTANT Resistant     TETRACYCLINE >=16 RESISTANT Resistant     VANCOMYCIN <=0.5 SENSITIVE Sensitive     TRIMETH/SULFA <=10 SENSITIVE Sensitive     CLINDAMYCIN >=8 RESISTANT Resistant     RIFAMPIN <=0.5 SENSITIVE Sensitive     Inducible Clindamycin NEGATIVE Sensitive     * FEW METHICILLIN RESISTANT STAPHYLOCOCCUS AUREUS    [x]  Treated with Clindamycin, organism resistant to prescribed antimicrobial  The only available PO option is Trimethoprim/sulfamathoxazole and she reports an allergy. On her follow-up visit on 2/28 it appears she is clinically improving.    Stop Clindamycin - her organism is resistant If her wound is not improving, recommend coming to the ED for possible IV antibiotics.  It is also possible she could receive Bactrim as an option but I don't see a reaction reported for her Bactrim allergy to assess this  further. If her wound is improving, continue wound care  ED Provider: Dietrich PatesHina Khatri, PA-C   Sallee Provencalurner, Kinsley Nicklaus S 01/25/2018, 8:07 AM Infectious Diseases Pharmacist Phone# [phone]

## 2018-01-25 NOTE — Telephone Encounter (Signed)
Called for Symptom check. Pts thinks wound is not getting better. Instructed to stop Clindamycin and come back to the ED to get IV abx treatment. Per Dietrich PatesHina Khatri PAC. See note from ID pharmacist.

## 2018-04-19 DIAGNOSIS — H0100A Unspecified blepharitis right eye, upper and lower eyelids: Secondary | ICD-10-CM | POA: Insufficient documentation

## 2018-08-29 ENCOUNTER — Encounter (HOSPITAL_BASED_OUTPATIENT_CLINIC_OR_DEPARTMENT_OTHER): Payer: Self-pay | Admitting: Emergency Medicine

## 2018-08-29 ENCOUNTER — Emergency Department (HOSPITAL_BASED_OUTPATIENT_CLINIC_OR_DEPARTMENT_OTHER)
Admission: EM | Admit: 2018-08-29 | Discharge: 2018-08-29 | Disposition: A | Discharge status: Home / Self Care | Payer: BLUE CROSS/BLUE SHIELD | Attending: Emergency Medicine | Admitting: Emergency Medicine

## 2018-08-29 ENCOUNTER — Other Ambulatory Visit: Payer: Self-pay

## 2018-08-29 DIAGNOSIS — Z7984 Long term (current) use of oral hypoglycemic drugs: Secondary | ICD-10-CM | POA: Insufficient documentation

## 2018-08-29 DIAGNOSIS — Z711 Person with feared health complaint in whom no diagnosis is made: Secondary | ICD-10-CM | POA: Diagnosis not present

## 2018-08-29 DIAGNOSIS — Z87891 Personal history of nicotine dependence: Secondary | ICD-10-CM | POA: Diagnosis not present

## 2018-08-29 DIAGNOSIS — Z79899 Other long term (current) drug therapy: Secondary | ICD-10-CM | POA: Insufficient documentation

## 2018-08-29 DIAGNOSIS — N898 Other specified noninflammatory disorders of vagina: Secondary | ICD-10-CM | POA: Diagnosis present

## 2018-08-29 DIAGNOSIS — B9689 Other specified bacterial agents as the cause of diseases classified elsewhere: Secondary | ICD-10-CM

## 2018-08-29 DIAGNOSIS — N760 Acute vaginitis: Secondary | ICD-10-CM | POA: Diagnosis not present

## 2018-08-29 LAB — URINALYSIS, ROUTINE W REFLEX MICROSCOPIC
BILIRUBIN URINE: NEGATIVE
Glucose, UA: NEGATIVE mg/dL
HGB URINE DIPSTICK: NEGATIVE
KETONES UR: NEGATIVE mg/dL
Nitrite: NEGATIVE
Protein, ur: NEGATIVE mg/dL
SPECIFIC GRAVITY, URINE: 1.025 (ref 1.005–1.030)
pH: 6 (ref 5.0–8.0)

## 2018-08-29 LAB — URINALYSIS, MICROSCOPIC (REFLEX)

## 2018-08-29 LAB — WET PREP, GENITAL
SPERM: NONE SEEN
Trich, Wet Prep: NONE SEEN
Yeast Wet Prep HPF POC: NONE SEEN

## 2018-08-29 LAB — PREGNANCY, URINE: PREG TEST UR: NEGATIVE

## 2018-08-29 MED ORDER — METRONIDAZOLE 500 MG PO TABS: 500.0000 mg | ORAL_TABLET | Freq: Two times a day (BID) | ORAL | 0 refills | Status: AC

## 2018-08-29 MED ORDER — AZITHROMYCIN 250 MG PO TABS
1000.0000 mg | ORAL_TABLET | Freq: Once | ORAL | Status: AC
  Administered 2018-08-29: 1000 mg via ORAL
  Filled 2018-08-29: qty 4

## 2018-08-29 MED ORDER — CEFTRIAXONE SODIUM 250 MG IJ SOLR
250.0000 mg | Freq: Once | INTRAMUSCULAR | Status: AC
  Administered 2018-08-29: 250 mg via INTRAMUSCULAR
  Filled 2018-08-29: qty 250

## 2018-08-29 MED FILL — metroNIDAZOLE 500 MG TABS: 500 | 7 days supply | Qty: 14 | Fill #0

## 2018-08-29 NOTE — ED Provider Notes (Signed)
[facility] EMERGENCY DEPARTMENT Provider Note   CSN: [id number] Arrival date & time: 08/29/18  2952     History   Chief Complaint Chief Complaint  Patient presents with  . Vaginal Discharge    HPI Glenda Mann is a 32 y.o. female past with history of gallstones, gestational diabetes who presents for evaluation of 3 weeks of vaginal discharge.  Patient said initially, the discharge started as a cloudy mucus but states that her last few days, it is turned yellow and is malodorous.  She describes a fishy odor from the discharge.  She states that she is currently sexually active with one partner.  They do not use protection.  She reports a history of STDs several years ago.  She states she was treated for trichomonas and gonorrhea.  Patient states that she has not had any vaginal bleeding, dysuria, hematuria, abdominal pain, nausea/vomiting, FEVERS.   The history is provided by the patient.    Past Medical History:  Diagnosis Date  . Gallstones   . Gestational diabetes   . Shingles     Patient Active Problem List   Diagnosis Date Noted  . 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 CHOLECYSTECTOMY    . INCISE AND DRAIN ABCESS       OB History    Gravida  2   Para  1   Term      Preterm      AB      Living        SAB      TAB      Ectopic      Multiple      Live Births               Home Medications    Prior to Admission medications   Medication Sig Start Date End Date Taking? Authorizing Provider  clindamycin (CLEOCIN) 300 MG capsule Take 1 capsule (300 mg total) by mouth 4 (four) times daily. X 7 days 05/20/17   Loren Racer, MD  glipiZIDE (GLUCOTROL) 10 MG tablet Take 10 mg by mouth daily before breakfast.    [provider]  metFORMIN (GLUCOPHAGE) 1000 MG tablet Take 1,000 mg by mouth 2 (two) times daily with a meal.    [provider]  metroNIDAZOLE (FLAGYL)  500 MG tablet Take 1 tablet (500 mg total) by mouth 2 (two) times daily for 7 days. 08/29/18 09/05/18  Maxwell Caul, PA-C  oxyCODONE-acetaminophen (PERCOCET) 5-325 MG tablet Take 1-2 tablets by mouth every 4 (four) hours as needed. 05/20/17   Loren Racer, MD    Family History Family History  Problem Relation Age of Onset  . Migraines Mother     Social History Social History   Tobacco Use  . Smoking status: Former Smoker    Packs/day: 0.50  . Smokeless tobacco: Never Used  Substance Use Topics  . Alcohol use: Yes    Alcohol/week: 4.0 standard drinks    Types: 4 Standard drinks or equivalent per week    Comment: drinks on weekends  . Drug use: No     Allergies   Bactrim [sulfamethoxazole-trimethoprim]; Penicillins; and Reglan [metoclopramide]   Review of Systems Review of Systems  Constitutional: Negative for fever.  Gastrointestinal: Negative for abdominal pain, nausea and vomiting.  Genitourinary: Positive for vaginal discharge. Negative for dysuria, hematuria and vaginal bleeding.  All other systems reviewed and are negative.    Physical Exam Updated  Vital Signs BP (!) 140/94 (BP Location: Left Arm)   Pulse 95   Temp 98.8 F (37.1 C) (Oral)   Resp 18   Ht 5\' 5"  (1.651 m)   Wt 106.7 kg   SpO2 99%   BMI 39.14 kg/m   Physical Exam  Constitutional: She appears well-developed and well-nourished.  HENT:  Head: Normocephalic and atraumatic.  Eyes: Conjunctivae and EOM are normal. Right eye exhibits no discharge. Left eye exhibits no discharge. No scleral icterus.  Pulmonary/Chest: Effort normal.  Abdominal: Soft. Normal appearance. There is no tenderness. There is no rigidity and no guarding.  Genitourinary: Uterus normal. Cervix exhibits discharge. Cervix exhibits no motion tenderness and no friability. Right adnexum displays no mass and no tenderness. Left adnexum displays no mass and no tenderness. Vaginal discharge found.  Genitourinary Comments: The  exam was performed with a chaperone present. Normal external female genitalia. No lesions, rash, or sores.  White mucus discharge in the vaginal canal.  Cervix is slightly erythematous but without friability.  Discharge noted from cervix.  No CMT.  No adnexal mass or tenderness bilaterally.  Neurological: She is alert.  Skin: Skin is warm and dry.  Psychiatric: She has a normal mood and affect. Her speech is normal and behavior is normal.  Nursing note and vitals reviewed.    ED Treatments / Results  Labs (all labs ordered are listed, but only abnormal results are displayed) Labs Reviewed  WET PREP, GENITAL - Abnormal; Notable for the following components:      Result Value   Clue Cells Wet Prep HPF POC PRESENT (*)    WBC, Wet Prep HPF POC MANY (*)    All other components within normal limits  URINALYSIS, ROUTINE W REFLEX MICROSCOPIC - Abnormal; Notable for the following components:   Leukocytes, UA TRACE (*)    All other components within normal limits  URINALYSIS, MICROSCOPIC (REFLEX) - Abnormal; Notable for the following components:   Bacteria, UA MANY (*)    All other components within normal limits  URINE CULTURE  PREGNANCY, URINE  GC/CHLAMYDIA PROBE AMP ([place]) NOT AT [facility]    EKG None  Radiology No results found.  Procedures Procedures (including critical care time)  Medications Ordered in ED Medications  cefTRIAXone (ROCEPHIN) injection 250 mg (250 mg Intramuscular Given 08/29/18 1056)  azithromycin (ZITHROMAX) tablet 1,000 mg (1,000 mg Oral Given 08/29/18 1055)     Initial Impression / Assessment and Plan / ED Course  I have reviewed the triage vital signs and the nursing notes.  Pertinent labs & imaging results that were available during my care of the patient were reviewed by me and considered in my medical decision making (see chart for details).     32 year old female who presents for evaluation of 3 weeks of vaginal discharge.  Reports one sexual  partner do not use protection.  No fevers, abdominal pain, nausea/vomiting, vaginal bleeding. Patient is afebrile, non-toxic appearing, sitting comfortably on examination table. Vital signs reviewed and stable.  Labs ordered at triage. Consider UTI versus BV versus STD.  Pelvic exam as documented above.  Patient with some mucousy discharge in the vaginal vault as well as some discharge from cervix.  No CMT that would be concerning for PID.  I discussed treatment options with patient.  She wishes to go ahead and be treated today.  She does have a history of allergies to penicillins.  I reviewed patient's record and she seen that she had previously had ceftriaxone without any  difficulties.  We will plan to treat for STDs.  Urine pregnancy negative.  UA shows trace leukocytes.  There is many bacteria but there is squamous epithelium so this is likely contaminant.  Urine culture sent.  Wet prep is positive for clue cells.  No trichomonas, yeast.  We will plan to treat for BV as well as STDs.  Patient instructed on safe sex practices.  Instructed patient not to have any intercourse until results come back and that her partner will need to be treated. Patient had ample opportunity for questions and discussion. All patient's questions were answered with full understanding. Strict return precautions discussed. Patient expresses understanding and agreement to plan.   Final Clinical Impressions(s) / ED Diagnoses   Final diagnoses:  BV (bacterial vaginosis)  Concern about STD in female without diagnosis    ED Discharge Orders         Ordered    metroNIDAZOLE (FLAGYL) 500 MG tablet  2 times daily     08/29/18 1057           Rosana Hoes 08/29/18 1820    Tegeler, Canary Brim, MD 08/30/18 [phone]

## 2018-08-29 NOTE — Discharge Instructions (Signed)
You have been treated today for an STD.   The test results with take 2-3 days to return. If there is an abnormal result, you will be notified. If you do not hear anything, that means the results were negative. You can also log on MyChart to see the results.   Your sexual partner needs to be treated too. Do not have sexual intercourse for the next 7 days and after your partner has been treated.   Your lab results also were positive for bacterial vaginosis.  As we discussed, this is an imbalance of the bacteria in the vagina.  Please take anabolic as directed. Take Flagyl as directed.  It is very important that you do not consume any alcohol while taking this medication as it will cause you to become violently ill.  Follow-up with your primary care doctor in 2-4 days. If you do not have a primary care doctor, you can use one listed in the paperwork.   Return to the Emergency Department for any fever, abdominal pain, difficulty breathing, nausea/vomiting or any other worsening or concerning symptoms.

## 2018-08-29 NOTE — ED Notes (Signed)
ED Provider at bedside. 

## 2018-08-29 NOTE — ED Triage Notes (Signed)
PT presents with c/o vaginal discharge and odor for 3 weeks. Pt reports lower back pain 9/10 and headaches every day

## 2018-08-30 LAB — URINE CULTURE

## 2018-08-30 LAB — GC/CHLAMYDIA PROBE AMP (~~LOC~~) NOT AT ARMC
Chlamydia: POSITIVE — AB
NEISSERIA GONORRHEA: NEGATIVE

## 2018-09-06 ENCOUNTER — Emergency Department (HOSPITAL_BASED_OUTPATIENT_CLINIC_OR_DEPARTMENT_OTHER)
Admission: EM | Admit: 2018-09-06 | Discharge: 2018-09-06 | Disposition: A | Discharge status: Home / Self Care | Payer: BLUE CROSS/BLUE SHIELD | Attending: Emergency Medicine | Admitting: Emergency Medicine

## 2018-09-06 ENCOUNTER — Other Ambulatory Visit: Payer: Self-pay

## 2018-09-06 ENCOUNTER — Encounter (HOSPITAL_BASED_OUTPATIENT_CLINIC_OR_DEPARTMENT_OTHER): Payer: Self-pay | Admitting: *Deleted

## 2018-09-06 DIAGNOSIS — Z7984 Long term (current) use of oral hypoglycemic drugs: Secondary | ICD-10-CM | POA: Diagnosis not present

## 2018-09-06 DIAGNOSIS — Z8632 Personal history of gestational diabetes: Secondary | ICD-10-CM | POA: Diagnosis not present

## 2018-09-06 DIAGNOSIS — Z79899 Other long term (current) drug therapy: Secondary | ICD-10-CM | POA: Insufficient documentation

## 2018-09-06 DIAGNOSIS — Z202 Contact with and (suspected) exposure to infections with a predominantly sexual mode of transmission: Secondary | ICD-10-CM

## 2018-09-06 DIAGNOSIS — Z87891 Personal history of nicotine dependence: Secondary | ICD-10-CM | POA: Insufficient documentation

## 2018-09-06 DIAGNOSIS — N898 Other specified noninflammatory disorders of vagina: Secondary | ICD-10-CM | POA: Diagnosis present

## 2018-09-06 MED ORDER — CEFTRIAXONE SODIUM 1 G IJ SOLR
INTRAMUSCULAR | Status: AC
  Filled 2018-09-06: qty 10

## 2018-09-06 MED ORDER — AZITHROMYCIN 250 MG PO TABS
1000.0000 mg | ORAL_TABLET | Freq: Once | ORAL | Status: AC
  Administered 2018-09-06: 1000 mg via ORAL
  Filled 2018-09-06: qty 4

## 2018-09-06 MED ORDER — CEFTRIAXONE SODIUM 250 MG IJ SOLR
250.0000 mg | Freq: Once | INTRAMUSCULAR | Status: AC
  Administered 2018-09-06: 250 mg via INTRAMUSCULAR
  Filled 2018-09-06: qty 250

## 2018-09-06 MED ORDER — CEFTRIAXONE SODIUM 250 MG IJ SOLR
INTRAMUSCULAR | Status: AC
  Filled 2018-09-06: qty 250

## 2018-09-06 MED ORDER — LIDOCAINE HCL (PF) 1 % IJ SOLN
INTRAMUSCULAR | Status: AC
  Administered 2018-09-06: 5 mL
  Filled 2018-09-06: qty 5

## 2018-09-06 MED ORDER — LIDOCAINE HCL (PF) 1 % IJ SOLN
INTRAMUSCULAR | Status: AC
  Filled 2018-09-06: qty 5

## 2018-09-06 NOTE — Discharge Instructions (Addendum)
No unprotected sexual activity for 2 weeks.  Follow-up with your primary doctor if symptoms do not improve or worsen.

## 2018-09-06 NOTE — ED Provider Notes (Signed)
[facility] EMERGENCY DEPARTMENT Provider Note   CSN: [id number] Arrival date & time: 09/06/18  1478     History   Chief Complaint   HPI Glenda Mann is a 32 y.o. female.  Patient is a 32 year old female with no significant past medical history.  She was seen 1 week ago with vaginal discharge.  She was treated for bacterial vaginosis as well as gonorrhea and chlamydia.  She states that she was not called and informed of her positive chlamydia test, and felt as though she was safe to resume sexual activity.  She has been having sex with the same partner who is also having symptoms and here today for treatment.  The history is provided by the patient.    Past Medical History:  Diagnosis Date  . Gallstones   . Gestational diabetes   . Shingles     Patient Active Problem List   Diagnosis Date Noted  . 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 CHOLECYSTECTOMY    . INCISE AND DRAIN ABCESS       OB History    Gravida  2   Para  1   Term      Preterm      AB      Living        SAB      TAB      Ectopic      Multiple      Live Births               Home Medications    Prior to Admission medications   Medication Sig Start Date End Date Taking? Authorizing Provider  glipiZIDE (GLUCOTROL) 10 MG tablet Take 10 mg by mouth daily before breakfast.   Yes [provider]  metFORMIN (GLUCOPHAGE) 1000 MG tablet Take 1,000 mg by mouth 2 (two) times daily with a meal.   Yes [provider]  rosuvastatin (CRESTOR) 20 MG tablet Take 20 mg by mouth daily.   Yes [provider]  Semaglutide (OZEMPIC, 1 MG/DOSE, [place]) Inject 1 mg/mL into the skin once a week.   Yes [provider]  clindamycin (CLEOCIN) 300 MG capsule Take 1 capsule (300 mg total) by mouth 4 (four) times daily. X 7 days 05/20/17   Loren Racer, MD  oxyCODONE-acetaminophen (PERCOCET) 5-325 MG tablet Take  1-2 tablets by mouth every 4 (four) hours as needed. 05/20/17   Loren Racer, MD    Family History Family History  Problem Relation Age of Onset  . Migraines Mother     Social History Social History   Tobacco Use  . Smoking status: Former Smoker    Packs/day: 0.50  . Smokeless tobacco: Never Used  Substance Use Topics  . Alcohol use: Yes    Alcohol/week: 4.0 standard drinks    Types: 4 Standard drinks or equivalent per week    Comment: drinks on weekends  . Drug use: No     Allergies   Bactrim [sulfamethoxazole-trimethoprim]; Penicillins; and Reglan [metoclopramide]   Review of Systems Review of Systems  All other systems reviewed and are negative.    Physical Exam Updated Vital Signs BP (!) 153/110 (BP Location: Left Arm) Comment: measured x2. RN aware.  Pulse 88   Temp 98.8 F (37.1 C) (Oral)   Resp 18   Ht 5\' 5"  (1.651 m)   Wt 106.6 kg   LMP  (LMP Unknown)   SpO2 100%  BMI 39.11 kg/m   Physical Exam  Constitutional: She is oriented to person, place, and time. She appears well-developed and well-nourished. No distress.  HENT:  Head: Normocephalic and atraumatic.  Neck: Normal range of motion. Neck supple.  Pulmonary/Chest: Effort normal.  Musculoskeletal: Normal range of motion.  Neurological: She is alert and oriented to person, place, and time.  Skin: Skin is warm and dry. She is not diaphoretic.  Nursing note and vitals reviewed.    ED Treatments / Results  Labs (all labs ordered are listed, but only abnormal results are displayed) Labs Reviewed - No data to display  EKG None  Radiology No results found.  Procedures Procedures (including critical care time)  Medications Ordered in ED Medications  cefTRIAXone (ROCEPHIN) injection 250 mg (has no administration in time range)  azithromycin (ZITHROMAX) tablet 1,000 mg (has no administration in time range)     Initial Impression / Assessment and Plan / ED Course  I have reviewed  the triage vital signs and the nursing notes.  Pertinent labs & imaging results that were available during my care of the patient were reviewed by me and considered in my medical decision making (see chart for details).  Patient will be retreated for gonorrhea and chlamydia.  I suspect she may have been reinfected by her partner as she resumes sexual activity while he was untreated and she was unaware of her positive test.  To return as needed for any problems.  Final Clinical Impressions(s) / ED Diagnoses   Final diagnoses:  None    ED Discharge Orders    None       Geoffery Lyons, MD 09/06/18 [phone]

## 2018-09-06 NOTE — ED Triage Notes (Signed)
Patient has been called last Monday to come back for (+) Chlamydia that was done Friday.

## 2018-10-25 ENCOUNTER — Other Ambulatory Visit: Payer: Self-pay

## 2018-10-25 ENCOUNTER — Emergency Department (HOSPITAL_BASED_OUTPATIENT_CLINIC_OR_DEPARTMENT_OTHER)
Admission: EM | Admit: 2018-10-25 | Discharge: 2018-10-25 | Disposition: A | Discharge status: Home / Self Care | Payer: BLUE CROSS/BLUE SHIELD | Attending: Emergency Medicine | Admitting: Emergency Medicine

## 2018-10-25 ENCOUNTER — Encounter (HOSPITAL_BASED_OUTPATIENT_CLINIC_OR_DEPARTMENT_OTHER): Payer: Self-pay | Admitting: *Deleted

## 2018-10-25 DIAGNOSIS — J111 Influenza due to unidentified influenza virus with other respiratory manifestations: Secondary | ICD-10-CM | POA: Insufficient documentation

## 2018-10-25 DIAGNOSIS — Z87891 Personal history of nicotine dependence: Secondary | ICD-10-CM | POA: Insufficient documentation

## 2018-10-25 DIAGNOSIS — E119 Type 2 diabetes mellitus without complications: Secondary | ICD-10-CM | POA: Insufficient documentation

## 2018-10-25 DIAGNOSIS — Z7984 Long term (current) use of oral hypoglycemic drugs: Secondary | ICD-10-CM | POA: Diagnosis not present

## 2018-10-25 DIAGNOSIS — R52 Pain, unspecified: Secondary | ICD-10-CM | POA: Diagnosis present

## 2018-10-25 DIAGNOSIS — R69 Illness, unspecified: Secondary | ICD-10-CM

## 2018-10-25 LAB — BASIC METABOLIC PANEL
Anion gap: 9 (ref 5–15)
BUN: 9 mg/dL (ref 6–20)
CHLORIDE: 106 mmol/L (ref 98–111)
CO2: 19 mmol/L — AB (ref 22–32)
CREATININE: 0.61 mg/dL (ref 0.44–1.00)
Calcium: 7.9 mg/dL — ABNORMAL LOW (ref 8.9–10.3)
GFR calc Af Amer: >60 mL/min (ref >60)
GFR calc non Af Amer: >60 mL/min (ref >60)
GLUCOSE: 233 mg/dL — AB (ref 70–99)
Potassium: 3.9 mmol/L (ref 3.5–5.1)
Sodium: 134 mmol/L — ABNORMAL LOW (ref 135–145)

## 2018-10-25 LAB — CBC WITH DIFFERENTIAL/PLATELET
Abs Immature Granulocytes: 0.02 10*3/uL (ref 0.00–0.07)
Basophils Absolute: 0 10*3/uL (ref 0.0–0.1)
Basophils Relative: 0 %
Eosinophils Absolute: 0 10*3/uL (ref 0.0–0.5)
Eosinophils Relative: 0 %
HEMATOCRIT: 40.7 % (ref 36.0–46.0)
HEMOGLOBIN: 12.8 g/dL (ref 12.0–15.0)
Immature Granulocytes: 0 %
LYMPHS ABS: 1.6 10*3/uL (ref 0.7–4.0)
LYMPHS PCT: 30 %
MCH: 27.5 pg (ref 26.0–34.0)
MCHC: 31.4 g/dL (ref 30.0–36.0)
MCV: 87.5 fL (ref 80.0–100.0)
MONO ABS: 0.7 10*3/uL (ref 0.1–1.0)
MONOS PCT: 14 %
NEUTROS ABS: 3 10*3/uL (ref 1.7–7.7)
NEUTROS PCT: 56 %
Platelets: 189 10*3/uL (ref 150–400)
RBC: 4.65 MIL/uL (ref 3.87–5.11)
RDW: 13.3 % (ref 11.5–15.5)
WBC: 5.4 10*3/uL (ref 4.0–10.5)
nRBC: 0 % (ref 0.0–0.2)

## 2018-10-25 LAB — CBG MONITORING, ED: Glucose-Capillary: 222 mg/dL — ABNORMAL HIGH (ref 70–99)

## 2018-10-25 LAB — GROUP A STREP BY PCR: Group A Strep by PCR: NOT DETECTED

## 2018-10-25 MED ORDER — SODIUM CHLORIDE 0.9 % IV BOLUS
1000.0000 mL | Freq: Once | INTRAVENOUS | Status: AC
  Administered 2018-10-25: 1000 mL via INTRAVENOUS

## 2018-10-25 MED ORDER — IBUPROFEN 400 MG PO TABS
600.0000 mg | ORAL_TABLET | Freq: Once | ORAL | Status: AC
  Administered 2018-10-25: 600 mg via ORAL
  Filled 2018-10-25: qty 1

## 2018-10-25 MED ORDER — ACETAMINOPHEN 325 MG PO TABS
650.0000 mg | ORAL_TABLET | Freq: Once | ORAL | Status: AC | PRN
  Administered 2018-10-25: 650 mg via ORAL
  Filled 2018-10-25: qty 2

## 2018-10-25 NOTE — ED Notes (Signed)
Pt. C/o cold symptoms and cough with runny nose.  Pt. Also has fever with she states "flu" symptoms.

## 2018-10-25 NOTE — Discharge Instructions (Addendum)
Please read instructions below.  You can take tylenol and alternate with ibuprofen every 4 hours as needed for sore throat, body aches or fever.  Drink plenty of water! Use saline nasal spray for congestion. Wash your hands frequently to avoid getting others sick. Follow up with your primary care provider as needed if symptoms persist. Return to the ER for inability to swallow liquids, difficulty breathing, or new or worsening symptoms.

## 2018-10-25 NOTE — ED Triage Notes (Signed)
Body aches, chills, fever x 4 days.

## 2018-10-25 NOTE — ED Provider Notes (Signed)
[facility] EMERGENCY DEPARTMENT Provider Note   CSN: [id number] Arrival date & time: 10/25/18  1553     History   Chief Complaint Chief Complaint  Patient presents with  . Generalized Body Aches    HPI Glenda Mann is a 32 y.o. female w PMHx T2DM, presenting to the ED with gradual onset of body aches and sore throat that began Monday. Pt reports throat was initially sore however is improving. She has assoc cough with some intermittent yellow phlegm, however endorses post nasal drip with rhinorrhea. She has diffuse body aches, headaches, chills. She has been treating with nyquil without much relief. Has not had influenza vaccine. Her daughter is sick with similar symptoms. Her blood sugar was in the 200s yesterday, did not check today. Denies difficulty breathing or swallowing, abd pain, N/V, urinary sx, ear pain.   The history is provided by the patient.    Past Medical History:  Diagnosis Date  . Gallstones   . Gestational diabetes   . Shingles     Patient Active Problem List   Diagnosis Date Noted  . 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 CHOLECYSTECTOMY    . INCISE AND DRAIN ABCESS       OB History    Gravida  2   Para  1   Term      Preterm      AB      Living        SAB      TAB      Ectopic      Multiple      Live Births               Home Medications    Prior to Admission medications   Medication Sig Start Date End Date Taking? Authorizing Provider  glipiZIDE (GLUCOTROL) 10 MG tablet Take 10 mg by mouth daily before breakfast.   Yes [provider]  metFORMIN (GLUCOPHAGE) 1000 MG tablet Take 1,000 mg by mouth 2 (two) times daily with a meal.   Yes [provider]  Semaglutide (OZEMPIC, 1 MG/DOSE, [place]) Inject 1 mg/mL into the skin once a week.   Yes [provider]  clindamycin (CLEOCIN) 300 MG capsule Take 1 capsule (300 mg total) by mouth 4  (four) times daily. X 7 days 05/20/17   Loren Racer, MD  oxyCODONE-acetaminophen (PERCOCET) 5-325 MG tablet Take 1-2 tablets by mouth every 4 (four) hours as needed. 05/20/17   Loren Racer, MD  rosuvastatin (CRESTOR) 20 MG tablet Take 20 mg by mouth daily.    [provider]    Family History Family History  Problem Relation Age of Onset  . Migraines Mother     Social History Social History   Tobacco Use  . Smoking status: Former Smoker    Packs/day: 0.50  . Smokeless tobacco: Never Used  Substance Use Topics  . Alcohol use: Yes    Alcohol/week: 4.0 standard drinks    Types: 4 Standard drinks or equivalent per week    Comment: drinks on weekends  . Drug use: No     Allergies   Bactrim [sulfamethoxazole-trimethoprim]; Penicillins; and Reglan [metoclopramide]   Review of Systems Review of Systems  Constitutional: Positive for chills.  HENT: Positive for postnasal drip, rhinorrhea and sore throat. Negative for congestion, ear pain, trouble swallowing and voice change.   Respiratory: Positive for cough. Negative for shortness of breath.   Gastrointestinal:  Negative for abdominal pain, nausea and vomiting.  Genitourinary: Negative for dysuria and frequency.  Musculoskeletal: Positive for myalgias (generalized).  Neurological: Positive for headaches.  All other systems reviewed and are negative.    Physical Exam Updated Vital Signs BP (!) 160/103   Pulse (!) 126   Temp (!) 102 F (38.9 C) (Oral)   Resp 20   Ht 5\' 5"  (1.651 m)   Wt 105.7 kg   SpO2 96%   BMI 38.77 kg/m   Physical Exam  Constitutional: She appears well-developed and well-nourished.  Non-toxic appearance. No distress.  HENT:  Head: Normocephalic and atraumatic.  Right Ear: Tympanic membrane is bulging. No middle ear effusion.  Left Ear: Tympanic membrane and ear canal normal.  Mouth/Throat: Uvula is midline. No trismus in the jaw. No uvula swelling. Posterior oropharyngeal  erythema present. No posterior oropharyngeal edema. No tonsillar exudate.  Eyes: Conjunctivae are normal.  Cardiovascular: Regular rhythm, normal heart sounds and intact distal pulses.  tachycardic  Pulmonary/Chest: Effort normal and breath sounds normal. No stridor. No respiratory distress. She has no wheezes. She has no rales.  Abdominal: Soft. Bowel sounds are normal. She exhibits no distension. There is no tenderness. There is no rebound and no guarding.  Neurological: She is alert.  Skin: Skin is warm.  Psychiatric: She has a normal mood and affect. Her behavior is normal.  Nursing note and vitals reviewed.    ED Treatments / Results  Labs (all labs ordered are listed, but only abnormal results are displayed) Labs Reviewed  BASIC METABOLIC PANEL - Abnormal; Notable for the following components:      Result Value   Sodium 134 (*)    CO2 19 (*)    Glucose, Bld 233 (*)    Calcium 7.9 (*)    All other components within normal limits  CBG MONITORING, ED - Abnormal; Notable for the following components:   Glucose-Capillary 222 (*)    All other components within normal limits  GROUP A STREP BY PCR  CBC WITH DIFFERENTIAL/PLATELET  INFLUENZA PANEL BY PCR (TYPE A & B)    EKG None  Radiology No results found.  Procedures Procedures (including critical care time)  Medications Ordered in ED Medications  acetaminophen (TYLENOL) tablet 650 mg (650 mg Oral Given 10/25/18 1641)  sodium chloride 0.9 % bolus 1,000 mL (0 mLs Intravenous Stopped 10/25/18 1806)  ibuprofen (ADVIL,MOTRIN) tablet 600 mg (600 mg Oral Given 10/25/18 1807)     Initial Impression / Assessment and Plan / ED Course  I have reviewed the triage vital signs and the nursing notes.  Pertinent labs & imaging results that were available during my care of the patient were reviewed by me and considered in my medical decision making (see chart for details).  Clinical Course as of Oct 25 1949  Fri Oct 25, 2018    1835 Pt re-evaluated. Improvement in sx and Vs. Will re-evaluate for further improvement after oral hydration and motrin with anticipated discharge.   [JR]    Clinical Course User Index [JR] Levone Otten, [place] N, PA-C    Pt with viral-illness since Monday. Cough, sore throat, rhinorrhea,headache, myalgias, fever. On evaluation, pt is tachycardic and febrile though is not toxic appearing. ENT exam is reassuring, right TM is bulging without effusion. Lungs CTAB, tolerating secretions. No tonsillar exudate. IVf ordered. Tylenol. Labs. Flu swab sent with high suspicion for influenza or similar viral illness.   On re-evaluation after fluids and antipyretics, vital signs normalized with temp 99.83F and  heart rate 98. Pt reporting improvement in symptoms. Labs are reassuring. No leukocytosis. Strep neg. Flu swab sent out and pending. Discussed symptomatic management for likely viral illness. PCP follow up recommended. Patient agreeable to plan, and safe for discharge.   Discussed results, findings, treatment and follow up. Patient advised of return precautions. Patient verbalized understanding and agreed with plan.  Final Clinical Impressions(s) / ED Diagnoses   Final diagnoses:  Influenza-like illness    ED Discharge Orders    None       Tabari Volkert, [place] N, PA-C 10/25/18 1950    Jacalyn LefevreHaviland, Julie, MD 10/25/18 2027

## 2018-10-26 LAB — INFLUENZA PANEL BY PCR (TYPE A & B)
INFLAPCR: NEGATIVE
INFLBPCR: POSITIVE — AB

## 2019-01-25 ENCOUNTER — Other Ambulatory Visit: Payer: Self-pay

## 2019-01-25 ENCOUNTER — Encounter (HOSPITAL_BASED_OUTPATIENT_CLINIC_OR_DEPARTMENT_OTHER): Payer: Self-pay | Admitting: *Deleted

## 2019-01-25 ENCOUNTER — Emergency Department (HOSPITAL_BASED_OUTPATIENT_CLINIC_OR_DEPARTMENT_OTHER)
Admission: EM | Admit: 2019-01-25 | Discharge: 2019-01-26 | Disposition: A | Discharge status: Home / Self Care | Payer: BLUE CROSS/BLUE SHIELD | Attending: Emergency Medicine | Admitting: Emergency Medicine

## 2019-01-25 DIAGNOSIS — Z79899 Other long term (current) drug therapy: Secondary | ICD-10-CM | POA: Insufficient documentation

## 2019-01-25 DIAGNOSIS — L02416 Cutaneous abscess of left lower limb: Secondary | ICD-10-CM | POA: Insufficient documentation

## 2019-01-25 DIAGNOSIS — Z7984 Long term (current) use of oral hypoglycemic drugs: Secondary | ICD-10-CM | POA: Insufficient documentation

## 2019-01-25 DIAGNOSIS — L739 Follicular disorder, unspecified: Secondary | ICD-10-CM | POA: Insufficient documentation

## 2019-01-25 DIAGNOSIS — F17210 Nicotine dependence, cigarettes, uncomplicated: Secondary | ICD-10-CM | POA: Insufficient documentation

## 2019-01-25 DIAGNOSIS — R739 Hyperglycemia, unspecified: Secondary | ICD-10-CM

## 2019-01-25 HISTORY — DX: Other specified bacterial agents as the cause of diseases classified elsewhere: N76.0

## 2019-01-25 HISTORY — DX: Chlamydial infection, unspecified: A74.9

## 2019-01-25 HISTORY — DX: Other specified bacterial agents as the cause of diseases classified elsewhere: B96.89

## 2019-01-25 MED ORDER — LIDOCAINE-EPINEPHRINE (PF) 1 %-1:200000 IJ SOLN
INTRAMUSCULAR | Status: AC
  Administered 2019-01-25: 10 mL
  Filled 2019-01-25: qty 10

## 2019-01-25 MED ORDER — CEPHALEXIN 250 MG PO CAPS
500.0000 mg | ORAL_CAPSULE | Freq: Once | ORAL | Status: AC
  Administered 2019-01-25: 500 mg via ORAL

## 2019-01-25 MED ORDER — CEPHALEXIN 250 MG PO CAPS
ORAL_CAPSULE | ORAL | Status: AC
  Filled 2019-01-25: qty 2

## 2019-01-25 MED ORDER — LIDOCAINE-EPINEPHRINE (PF) 2 %-1:200000 IJ SOLN
10.0000 mL | Freq: Once | INTRAMUSCULAR | Status: DC
  Filled 2019-01-25: qty 10

## 2019-01-25 NOTE — ED Provider Notes (Signed)
[facility] EMERGENCY DEPARTMENT Provider Note  CSN: [id number] Arrival date & time: 01/25/19 2251  Chief Complaint(s) Recurrent Skin Infections  HPI Glenda Mann is a 33 y.o. female with a history of diabetes who presents to the emergency department with 3 days of left thigh pain and swelling.  States that she has a boil in her left thigh.  Has tried warm compresses without relief.  She has associated severe, throbbing pain exacerbated with walking and sitting as well as palpation of the area.  Alleviated by preventing contact.  Denies any fevers or chills.  No chest pain or shortness of breath.  No abdominal pain.  No nausea or vomiting.  Patient also states that she has a "breakout" on her abdomen.  HPI  Past Medical History Past Medical History:  Diagnosis Date  . BV (bacterial vaginosis)   . Chlamydia   . Gallstones   . Gestational diabetes   . Shingles    Patient Active Problem List   Diagnosis Date Noted  . Hidradenitis axillaris 12/20/2011   Home Medication(s) Prior to Admission medications   Medication Sig Start Date End Date Taking? Authorizing Provider  glipiZIDE (GLUCOTROL) 10 MG tablet Take 10 mg by mouth daily before breakfast.   Yes [provider]  metFORMIN (GLUCOPHAGE) 1000 MG tablet Take 1,000 mg by mouth 2 (two) times daily with a meal.   Yes [provider]  cephALEXin (KEFLEX) 500 MG capsule Take 1 capsule (500 mg total) by mouth 2 (two) times daily for 7 days. 01/26/19 02/02/19  Nira Conn, MD  clindamycin (CLEOCIN) 300 MG capsule Take 1 capsule (300 mg total) by mouth 4 (four) times daily. X 7 days 05/20/17   Loren Racer, MD  levonorgestrel [facility]) 20 MCG/24HR IUD by Intrauterine route.    [provider]  oxyCODONE-acetaminophen (PERCOCET) 5-325 MG tablet Take 1-2 tablets by mouth every 4 (four) hours as needed. 05/20/17   Loren Racer, MD  rosuvastatin (CRESTOR) 20 MG tablet Take 20 mg by mouth daily.     [provider]  Semaglutide (OZEMPIC, 1 MG/DOSE, [place]) Inject 1 mg/mL into the skin once a week.    [provider]                                                                                                                                    Past Surgical History Past Surgical History:  Procedure Laterality Date  . CHOLECYSTECTOMY    . INCISE AND DRAIN ABCESS     Family History Family History  Problem Relation Age of Onset  . Migraines Mother     Social History Social History   Tobacco Use  . Smoking status: Current Every Day Smoker    Packs/day: 0.50    Types: Cigarettes  . Smokeless tobacco: Never Used  Substance Use Topics  . Alcohol use: Yes    Alcohol/week: 4.0 standard drinks  Types: 4 Standard drinks or equivalent per week    Comment: drinks on weekends  . Drug use: No   Allergies Bactrim [sulfamethoxazole-trimethoprim]; Penicillins; and Reglan [metoclopramide]  Review of Systems Review of Systems All other systems are reviewed and are negative for acute change except as noted in the HPI  Physical Exam Vital Signs  I have reviewed the triage vital signs BP 123/63 (BP Location: Left Arm)   Pulse (!) 103   Temp 98.2 F (36.8 C) (Oral)   Resp 16   Ht 5\' 5"  (1.651 m)   Wt 107.5 kg   SpO2 97%   Breastfeeding No   BMI 39.44 kg/m   Physical Exam Vitals signs reviewed.  Constitutional:      General: She is not in acute distress.    Appearance: She is well-developed. She is not diaphoretic.  HENT:     Head: Normocephalic and atraumatic.     Right Ear: External ear normal.     Left Ear: External ear normal.     Nose: Nose normal.  Eyes:     General: No scleral icterus.    Conjunctiva/sclera: Conjunctivae normal.  Neck:     Musculoskeletal: Normal range of motion.     Trachea: Phonation normal.  Cardiovascular:     Rate and Rhythm: Normal rate and regular rhythm.  Pulmonary:     Effort: Pulmonary effort is normal. No  respiratory distress.     Breath sounds: No stridor.  Abdominal:     General: There is no distension.  Musculoskeletal: Normal range of motion.  Skin:      Neurological:     Mental Status: She is alert and oriented to person, place, and time.  Psychiatric:        Behavior: Behavior normal.     ED Results and Treatments Labs (all labs ordered are listed, but only abnormal results are displayed) Labs Reviewed  CBC WITH DIFFERENTIAL/PLATELET - Abnormal; Notable for the following components:      Result Value   WBC 13.8 (*)    Neutro Abs 9.9 (*)    All other components within normal limits  URINALYSIS, ROUTINE W REFLEX MICROSCOPIC - Abnormal; Notable for the following components:   Glucose, UA >=500 (*)    All other components within normal limits  URINALYSIS, MICROSCOPIC (REFLEX) - Abnormal; Notable for the following components:   Bacteria, UA RARE (*)    All other components within normal limits  CBG MONITORING, ED - Abnormal; Notable for the following components:   Glucose-Capillary 388 (*)    All other components within normal limits  POCT I-STAT EG7 - Abnormal; Notable for the following components:   pCO2, Ven 40.8 (*)    pO2, Ven 60.0 (*)    All other components within normal limits  CBG MONITORING, ED - Abnormal; Notable for the following components:   Glucose-Capillary 349 (*)    All other components within normal limits  I-STAT VENOUS BLOOD GAS, ED  EKG  EKG Interpretation  Date/Time:    Ventricular Rate:    PR Interval:    QRS Duration:   QT Interval:    QTC Calculation:   R Axis:     Text Interpretation:        Radiology No results found. Pertinent labs & imaging results that were available during my care of the patient were reviewed by me and considered in my medical decision making (see chart for details).  Medications Ordered in  ED Medications  lidocaine-EPINEPHrine (XYLOCAINE W/EPI) 2 %-1:200000 (PF) injection 10 mL (10 mLs Intradermal Not Given 01/25/19 2332)  cephALEXin (KEFLEX) capsule 500 mg (500 mg Oral Given 01/25/19 2326)  lidocaine-EPINEPHrine (XYLOCAINE-EPINEPHrine) 1 %-1:200000 (PF) injection (10 mLs  Given by Other 01/25/19 2326)  sodium chloride 0.9 % bolus 1,000 mL (0 mLs Intravenous Stopped 01/26/19 0224)                                                                                                                                    Procedures Procedures  (including critical care time)  Medical Decision Making / ED Course I have reviewed the nursing notes for this encounter and the patient's prior records (if available in EHR or on provided paperwork).    1.  Left thigh abscess I&D as above.  No overlying cellulitis requiring antibiotics.  2.  Abdominal wall folliculitis.  Will prescribe patient Keflex  3.  Patient with a history of diabetes noted to be hyperglycemic.  Labs without evidence of diabetic ketoacidosis.  BMP hemolyzed.  No need to repeat at this time.  Recommend she follow-up closely with her primary care provider to discuss better glycemic control.  The patient appears reasonably screened and/or stabilized for discharge and I doubt any other medical condition or other [facility] requiring further screening, evaluation, or treatment in the ED at this time prior to discharge.  The patient is safe for discharge with strict return precautions.   Final Clinical Impression(s) / ED Diagnoses Final diagnoses:  Abscess of left thigh  Folliculitis  Hyperglycemia    Disposition: Discharge  Condition: Good  I have discussed the results, Dx and Tx plan with the patient who expressed understanding and agree(s) with the plan. Discharge instructions discussed at great length. The patient was given strict return precautions who verbalized understanding of the instructions. No further questions at  time of discharge.    ED Discharge Orders         Ordered    cephALEXin (KEFLEX) 500 MG capsule  2 times daily     01/26/19 0257           Follow Up: [facility] [address] [place] [address] [phone]  Schedule an appointment as soon as possible for a visit  For close follow up to discuss diabetes management     This chart was dictated using voice recognition software.  Despite best efforts to proofread,  errors can occur which can change the documentation meaning.   Nira Conn, MD 01/26/19 [phone]

## 2019-01-25 NOTE — ED Triage Notes (Addendum)
Pt c/o boil to inner thigh x 3 days. States she is also having an "outbreak"

## 2019-01-26 ENCOUNTER — Encounter (HOSPITAL_BASED_OUTPATIENT_CLINIC_OR_DEPARTMENT_OTHER): Payer: Self-pay | Admitting: Emergency Medicine

## 2019-01-26 LAB — CBC WITH DIFFERENTIAL/PLATELET
ABS IMMATURE GRANULOCYTES: 0.04 10*3/uL (ref 0.00–0.07)
BASOS ABS: 0 10*3/uL (ref 0.0–0.1)
BASOS PCT: 0 %
Eosinophils Absolute: 0.2 10*3/uL (ref 0.0–0.5)
Eosinophils Relative: 2 %
HCT: 41.6 % (ref 36.0–46.0)
Hemoglobin: 13.1 g/dL (ref 12.0–15.0)
IMMATURE GRANULOCYTES: 0 %
Lymphocytes Relative: 22 %
Lymphs Abs: 3 10*3/uL (ref 0.7–4.0)
MCH: 27.6 pg (ref 26.0–34.0)
MCHC: 31.5 g/dL (ref 30.0–36.0)
MCV: 87.8 fL (ref 80.0–100.0)
MONOS PCT: 5 %
Monocytes Absolute: 0.7 10*3/uL (ref 0.1–1.0)
NEUTROS ABS: 9.9 10*3/uL — AB (ref 1.7–7.7)
NEUTROS PCT: 71 %
NRBC: 0 % (ref 0.0–0.2)
PLATELETS: 247 10*3/uL (ref 150–400)
RBC: 4.74 MIL/uL (ref 3.87–5.11)
RDW: 12.2 % (ref 11.5–15.5)
WBC: 13.8 10*3/uL — ABNORMAL HIGH (ref 4.0–10.5)

## 2019-01-26 LAB — URINALYSIS, MICROSCOPIC (REFLEX): RBC / HPF: NONE SEEN RBC/hpf (ref 0–5)

## 2019-01-26 LAB — URINALYSIS, ROUTINE W REFLEX MICROSCOPIC
Bilirubin Urine: NEGATIVE
Glucose, UA: >=500 mg/dL — AB
Hgb urine dipstick: NEGATIVE
KETONES UR: NEGATIVE mg/dL
LEUKOCYTE UA: NEGATIVE
NITRITE: NEGATIVE
PROTEIN: NEGATIVE mg/dL
Specific Gravity, Urine: 1.01 (ref 1.005–1.030)
pH: 5.5 (ref 5.0–8.0)

## 2019-01-26 LAB — POCT I-STAT EG7
ACID-BASE DEFICIT: 1 mmol/L (ref 0.0–2.0)
BICARBONATE: 24.3 mmol/L (ref 20.0–28.0)
CALCIUM ION: 1.26 mmol/L (ref 1.15–1.40)
HEMATOCRIT: 39 % (ref 36.0–46.0)
Hemoglobin: 13.3 g/dL (ref 12.0–15.0)
O2 SAT: 90 %
PH VEN: 7.384 (ref 7.250–7.430)
PO2 VEN: 60 mmHg — AB (ref 32.0–45.0)
Patient temperature: 98.6
Potassium: 4.1 mmol/L (ref 3.5–5.1)
Sodium: 135 mmol/L (ref 135–145)
TCO2: 26 mmol/L (ref 22–32)
pCO2, Ven: 40.8 mmHg — ABNORMAL LOW (ref 44.0–60.0)

## 2019-01-26 LAB — CBG MONITORING, ED
GLUCOSE-CAPILLARY: 388 mg/dL — AB (ref 70–99)
Glucose-Capillary: 349 mg/dL — ABNORMAL HIGH (ref 70–99)

## 2019-01-26 MED ORDER — SODIUM CHLORIDE 0.9 % IV BOLUS
1000.0000 mL | Freq: Once | INTRAVENOUS | Status: AC
  Administered 2019-01-26: 1000 mL via INTRAVENOUS

## 2019-01-26 MED ORDER — FLUCONAZOLE 150 MG PO TABS: 150.0000 mg | ORAL_TABLET | Freq: Once | ORAL | 0 refills | Status: AC

## 2019-01-26 MED ORDER — CEPHALEXIN 500 MG PO CAPS: 500.0000 mg | ORAL_CAPSULE | Freq: Two times a day (BID) | ORAL | 0 refills | Status: AC

## 2019-01-26 MED ORDER — FLUCONAZOLE 150 MG PO TABS: 200.0000 mg | ORAL_TABLET | Freq: Once | ORAL | 0 refills | Status: DC

## 2019-01-26 NOTE — ED Notes (Signed)
CBG 388 

## 2019-01-26 NOTE — ED Notes (Signed)
CBG - 349 

## 2019-04-04 ENCOUNTER — Encounter (HOSPITAL_BASED_OUTPATIENT_CLINIC_OR_DEPARTMENT_OTHER): Payer: Self-pay

## 2019-04-04 ENCOUNTER — Emergency Department (HOSPITAL_BASED_OUTPATIENT_CLINIC_OR_DEPARTMENT_OTHER)
Admission: EM | Admit: 2019-04-04 | Discharge: 2019-04-04 | Disposition: A | Discharge status: Home / Self Care | Payer: Medicaid Other | Attending: Emergency Medicine | Admitting: Emergency Medicine

## 2019-04-04 ENCOUNTER — Other Ambulatory Visit: Payer: Self-pay

## 2019-04-04 DIAGNOSIS — B373 Candidiasis of vulva and vagina: Secondary | ICD-10-CM | POA: Insufficient documentation

## 2019-04-04 DIAGNOSIS — F17210 Nicotine dependence, cigarettes, uncomplicated: Secondary | ICD-10-CM | POA: Insufficient documentation

## 2019-04-04 DIAGNOSIS — L02416 Cutaneous abscess of left lower limb: Secondary | ICD-10-CM | POA: Insufficient documentation

## 2019-04-04 DIAGNOSIS — N898 Other specified noninflammatory disorders of vagina: Secondary | ICD-10-CM

## 2019-04-04 DIAGNOSIS — B3731 Acute candidiasis of vulva and vagina: Secondary | ICD-10-CM

## 2019-04-04 LAB — URINALYSIS, MICROSCOPIC (REFLEX): RBC / HPF: NONE SEEN RBC/hpf (ref 0–5)

## 2019-04-04 LAB — PREGNANCY, URINE: Preg Test, Ur: NEGATIVE

## 2019-04-04 LAB — CBG MONITORING, ED: Glucose-Capillary: 394 mg/dL — ABNORMAL HIGH (ref 70–99)

## 2019-04-04 LAB — URINALYSIS, ROUTINE W REFLEX MICROSCOPIC
Bilirubin Urine: NEGATIVE
Glucose, UA: >=500 mg/dL — AB
Hgb urine dipstick: NEGATIVE
Ketones, ur: NEGATIVE mg/dL
Leukocytes,Ua: NEGATIVE
Nitrite: NEGATIVE
Protein, ur: NEGATIVE mg/dL
Specific Gravity, Urine: 1.02 (ref 1.005–1.030)
pH: 6.5 (ref 5.0–8.0)

## 2019-04-04 LAB — WET PREP, GENITAL
Clue Cells Wet Prep HPF POC: NONE SEEN
Sperm: NONE SEEN
Trich, Wet Prep: NONE SEEN

## 2019-04-04 MED ORDER — VALACYCLOVIR HCL 1 G PO TABS: 1000.0000 mg | ORAL_TABLET | Freq: Every day | ORAL | 0 refills | Status: AC

## 2019-04-04 MED ORDER — FLUCONAZOLE 50 MG PO TABS
150.0000 mg | ORAL_TABLET | Freq: Once | ORAL | Status: AC
  Administered 2019-04-04: 150 mg via ORAL
  Filled 2019-04-04: qty 1

## 2019-04-04 MED ORDER — AZITHROMYCIN 1 G PO PACK
1.0000 g | PACK | Freq: Once | ORAL | Status: AC
  Administered 2019-04-04: 1 g via ORAL
  Filled 2019-04-04: qty 1

## 2019-04-04 MED ORDER — CEFTRIAXONE SODIUM 250 MG IJ SOLR
250.0000 mg | Freq: Once | INTRAMUSCULAR | Status: AC
  Administered 2019-04-04: 250 mg via INTRAMUSCULAR
  Filled 2019-04-04: qty 250

## 2019-04-04 MED ORDER — FLUCONAZOLE 150 MG PO TABS: 150.0000 mg | ORAL_TABLET | Freq: Every day | ORAL | 0 refills | Status: AC

## 2019-04-04 MED ORDER — DOXYCYCLINE HYCLATE 100 MG PO CAPS: 100.0000 mg | ORAL_CAPSULE | Freq: Two times a day (BID) | ORAL | 0 refills | Status: DC

## 2019-04-04 NOTE — Discharge Instructions (Addendum)
Take doxycycline twice daily for your abscess.  Use warm compresses 3-4 times daily to help continue drainage.  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  Take Diflucan in 72 hours only if your vaginal discharge is still present. You have been treated for gonorrhea and chlamydia today. You will be called in 3 days if any of your tests return positive. In that case, please make all of your sexual partners aware that they will need to be treated as well. Abstain from intercourse for one week until you have both been treated. Use condoms in the future to help prevent sexually transmitted disease and unwanted pregnancy. You can go to the health department in the future for free STD testing.  I recommend splitting your glipizide and taking one half 30 minutes before breakfast and the other half 30 minutes before dinner, while you are waiting for your Ozempic.  You can talk to your doctor about this and better control of your glucose in this time as well.  Please return to the emergency department if you develop any new or worsening symptoms including increasing pain or swelling to your abscess, or any other new or concerning symptoms.

## 2019-04-04 NOTE — ED Provider Notes (Signed)
[facility] EMERGENCY DEPARTMENT Provider Note   CSN: [id number] Arrival date & time: 04/04/19  1215    History   Chief Complaint Chief Complaint  Patient presents with  . Vaginal Discharge    HPI Glenda Mann is a 33 y.o. female with history of diabetes, BV who presents with a 4-day history of vaginal discharge.  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  Patient also mentioned that she had a couple episodes of bleeding after sex the past week or so.  She gets irregular periods related to her Mirena IUD.  She also reports an area on her left hip that is red, swollen, and painful.  She has history of abscesses and this feels the same.  Patient denies any abdominal pain, other urinary symptoms, fever, chest pain, shortness of breath.  Patient reports a dry cough related to her smoking, this is unchanged.  She has not tried any medication over-the-counter.  She notes that she has been out of 1 of her diabetes medications, Ozempic, and is concerned her blood sugar may be high.  She is in the process of getting this medication refilled.  She is still taking metformin and glipizide.    HPI  Past Medical History:  Diagnosis Date  . BV (bacterial vaginosis)   . Chlamydia   . Gallstones   . Gestational diabetes   . Shingles     Patient Active Problem List   Diagnosis Date Noted  . 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 CHOLECYSTECTOMY    . INCISE AND DRAIN ABCESS       OB History    Gravida  2   Para  1   Term      Preterm      AB      Living        SAB      TAB      Ectopic      Multiple      Live Births               Home Medications    Prior to Admission medications   Medication Sig Start Date End Date Taking?  Authorizing Provider  clindamycin (CLEOCIN) 300 MG capsule Take 1 capsule (300 mg total) by mouth 4 (four) times daily. X 7 days 05/20/17   Loren RacerYelverton, David, MD  doxycycline (VIBRAMYCIN) 100 MG capsule Take 1 capsule (100 mg total) by mouth 2 (two) times daily. 04/04/19   Brondon Wann, Waylan BogaAlexandra M, PA-C  fluconazole (DIFLUCAN) 150 MG tablet Take 1 tablet (150 mg total) by mouth daily for 1 dose. In 72 hours if symptoms have not resolved. 04/04/19 04/05/19  Modine Oppenheimer, Waylan BogaAlexandra M, PA-C  glipiZIDE (GLUCOTROL) 10 MG tablet Take 10 mg by mouth daily before breakfast.    [provider]  levonorgestrel (MIRENA) 20 MCG/24HR IUD by Intrauterine route.    [provider]  metFORMIN (GLUCOPHAGE) 1000 MG tablet Take 1,000 mg by mouth 2 (two) times daily with a meal.    [provider]  oxyCODONE-acetaminophen (PERCOCET) 5-325 MG tablet Take 1-2 tablets by mouth every 4 (four) hours as needed. 05/20/17   Loren RacerYelverton, David, MD  rosuvastatin (CRESTOR) 20 MG tablet Take 20 mg by mouth daily.  [provider]  Semaglutide (OZEMPIC, 1 MG/DOSE, [place]) Inject 1 mg/mL into the skin once a week.    [provider]  valACYclovir (VALTREX) 1000 MG tablet Take 1 tablet (1,000 mg total) by mouth daily for 5 days. 04/04/19 04/09/19  Emi Holes, PA-C    Family History Family History  Problem Relation Age of Onset  . Migraines Mother     Social History Social History   Tobacco Use  . Smoking status: Current Every Day Smoker    Packs/day: 0.50    Types: Cigarettes  . Smokeless tobacco: Never Used  Substance Use Topics  . Alcohol use: Yes    Alcohol/week: 4.0 standard drinks    Types: 4 Standard drinks or equivalent per week    Comment: drinks on weekends  . Drug use: No     Allergies   Bactrim [sulfamethoxazole-trimethoprim]; Penicillins; and Reglan [metoclopramide]   Review of Systems Review of Systems  Constitutional: Negative for chills and fever.  HENT: Negative for facial  swelling and sore throat.   Respiratory: Negative for shortness of breath.   Cardiovascular: Negative for chest pain.  Gastrointestinal: Negative for abdominal pain, diarrhea, nausea and vomiting.  Genitourinary: Positive for dysuria (on the outside related to "sore"), vaginal bleeding and vaginal discharge.  Musculoskeletal: Negative for back pain.  Skin: Positive for wound. Negative for rash.  Neurological: Negative for headaches.  Psychiatric/Behavioral: The patient is not nervous/anxious.      Physical Exam Updated Vital Signs BP 119/89   Pulse 99   Temp 98.8 F (37.1 C) (Oral)   Resp 16   Ht 5\' 5"  (1.651 m)   Wt 107.5 kg   SpO2 99%   BMI 39.44 kg/m   Physical Exam Vitals signs and nursing note reviewed. Exam conducted with a chaperone present.  Constitutional:      General: She is not in acute distress.    Appearance: She is well-developed. She is not diaphoretic.  HENT:     Head: Normocephalic and atraumatic.     Mouth/Throat:     Pharynx: No oropharyngeal exudate.  Eyes:     General: No scleral icterus.       Right eye: No discharge.        Left eye: No discharge.     Conjunctiva/sclera: Conjunctivae normal.     Pupils: Pupils are equal, round, and reactive to light.  Neck:     Musculoskeletal: Normal range of motion and neck supple.     Thyroid: No thyromegaly.  Cardiovascular:     Rate and Rhythm: Normal rate and regular rhythm.     Heart sounds: Normal heart sounds. No murmur. No friction rub. No gallop.   Pulmonary:     Effort: Pulmonary effort is normal. No respiratory distress.     Breath sounds: Normal breath sounds. No stridor. No wheezing or rales.  Abdominal:     General: Bowel sounds are normal. There is no distension.     Palpations: Abdomen is soft.     Tenderness: There is no abdominal tenderness. There is no guarding or rebound.  Genitourinary:    Labia:        Right: Rash (excoriation on the mucosa, no vesicles noted at this time )  present.      Vagina: Vaginal discharge (white, milky) present.     Cervix: No cervical motion tenderness.     Uterus: Normal. Not tender.      Adnexa: Right adnexa normal and left adnexa normal.  Right: No tenderness.         Left: No tenderness.    Lymphadenopathy:     Cervical: No cervical adenopathy.  Skin:    General: Skin is warm and dry.     Coloration: Skin is not pale.     Findings: No rash.     Comments: Less than 1 cm area of induration, no fluctuance, but there is purulence noted under the skin, 2 cm area of erythema surrounding, tender  Neurological:     Mental Status: She is alert.     Coordination: Coordination normal.      ED Treatments / Results  Labs (all labs ordered are listed, but only abnormal results are displayed) Labs Reviewed  WET PREP, GENITAL - Abnormal; Notable for the following components:      Result Value   Yeast Wet Prep HPF POC PRESENT (*)    WBC, Wet Prep HPF POC MANY (*)    All other components within normal limits  URINALYSIS, ROUTINE W REFLEX MICROSCOPIC - Abnormal; Notable for the following components:   Glucose, UA >=500 (*)    All other components within normal limits  URINALYSIS, MICROSCOPIC (REFLEX) - Abnormal; Notable for the following components:   Bacteria, UA MANY (*)    All other components within normal limits  CBG MONITORING, ED - Abnormal; Notable for the following components:   Glucose-Capillary 394 (*)    All other components within normal limits  PREGNANCY, URINE  RPR  HIV ANTIBODY (ROUTINE TESTING W REFLEX)  GC/CHLAMYDIA PROBE AMP ([place]) NOT AT [facility]    EKG None  Radiology No results found.  Procedures .Marland KitchenIncision and Drainage Date/Time: 04/04/2019 2:14 PM Performed by: Emi Holes, PA-C Authorized by: Emi Holes, PA-C   Consent:    Consent obtained:  Verbal   Consent given by:  Patient   Risks discussed:  Infection, incomplete drainage and pain   Alternatives discussed:  Alternative  treatment Location:    Type:  Abscess   Size:  1   Location:  Lower extremity   Lower extremity location:  Hip   Hip location:  L hip Pre-procedure details:    Skin preparation:  Chloraprep Anesthesia (see MAR for exact dosages):    Anesthesia method:  None Procedure type:    Complexity:  Simple Procedure details:    Incision types:  Stab incision   Scalpel size: 18G needle.   Wound management:  Irrigated with saline   Drainage:  Purulent and bloody   Drainage amount:  Moderate   Wound treatment:  Wound left open   Packing materials:  None Post-procedure details:    Patient tolerance of procedure:  Tolerated well, no immediate complications   (including critical care time)  Medications Ordered in ED Medications  cefTRIAXone (ROCEPHIN) injection 250 mg (250 mg Intramuscular Given 04/04/19 1420)  azithromycin (ZITHROMAX) powder 1 g (1 g Oral Given 04/04/19 1417)  fluconazole (DIFLUCAN) tablet 150 mg (150 mg Oral Given 04/04/19 1419)     Initial Impression / Assessment and Plan / ED Course  I have reviewed the triage vital signs and the nursing notes.  Pertinent labs & imaging results that were available during my care of the patient were reviewed by me and considered in my medical decision making (see chart for details).        Patient presenting with multiple complaints.  She is found to have a yeast vaginitis.  GC/chlamydia, HIV, RPR sent and pending.  Patient would like prophylactic  treatment for GC and chlamydia considering her concern for STD exposure.  UA shows glucose, but no ketones, many bacteria, however dirty sample and patient denies any UTI symptoms.  Although hyperglycemic, no signs of DKA in the urine and no symptoms of DKA.  Patient just took her diabetic medication prior to arrival here.  We will also advised splitting glipizide into 2 daily doses until she gets her Ozempic medication refilled.  She is currently working on a prescription assistance program.   Abscess is very small and was not fluctuant at all, however stab with 18-gauge needle did elicit a little purulent drainage.  This was drained until bloody drainage only.  Irrigated with saline.  Patient advised to use warm compresses to continue drainage.  Will start doxycycline.  We will also start Valtrex, as vaginal rash is most likely HSV outbreak.  Patient given single dose of Diflucan and will be discharged home with repeat in 72 hours if still present.  Patient also given Rocephin and azithromycin per request for prophylactic treatment.  Return precautions discussed.  Patient understands and agrees with plan.  Patient vitals stable throughout ED course and discharged in satisfactory condition.   Final Clinical Impressions(s) / ED Diagnoses   Final diagnoses:  Vaginal discharge  Vaginal yeast infection  Abscess of left thigh    ED Discharge Orders         Ordered    doxycycline (VIBRAMYCIN) 100 MG capsule  2 times daily     04/04/19 1413    valACYclovir (VALTREX) 1000 MG tablet  Daily     04/04/19 1413    fluconazole (DIFLUCAN) 150 MG tablet  Daily     04/04/19 [address] PA-C 04/04/19 1451    Alvira Monday, MD 04/05/19 1530

## 2019-04-04 NOTE — ED Triage Notes (Signed)
C/o vaginal d/c x 3 days-also c/o scattered blisters-NAD-steady gait

## 2019-04-05 LAB — RPR: RPR Ser Ql: NONREACTIVE

## 2019-04-05 LAB — HIV ANTIBODY (ROUTINE TESTING W REFLEX): HIV Screen 4th Generation wRfx: NONREACTIVE

## 2019-04-07 LAB — GC/CHLAMYDIA PROBE AMP (~~LOC~~) NOT AT ARMC
Chlamydia: NEGATIVE
Neisseria Gonorrhea: NEGATIVE

## 2019-05-13 ENCOUNTER — Other Ambulatory Visit: Payer: Self-pay

## 2019-05-13 ENCOUNTER — Encounter (HOSPITAL_BASED_OUTPATIENT_CLINIC_OR_DEPARTMENT_OTHER): Payer: Self-pay | Admitting: Adult Health

## 2019-05-13 ENCOUNTER — Emergency Department (HOSPITAL_BASED_OUTPATIENT_CLINIC_OR_DEPARTMENT_OTHER)
Admission: EM | Admit: 2019-05-13 | Discharge: 2019-05-13 | Disposition: A | Discharge status: Home / Self Care | Payer: Medicaid Other | Attending: Emergency Medicine | Admitting: Emergency Medicine

## 2019-05-13 DIAGNOSIS — Z79899 Other long term (current) drug therapy: Secondary | ICD-10-CM | POA: Insufficient documentation

## 2019-05-13 DIAGNOSIS — L03011 Cellulitis of right finger: Secondary | ICD-10-CM

## 2019-05-13 DIAGNOSIS — Z7984 Long term (current) use of oral hypoglycemic drugs: Secondary | ICD-10-CM | POA: Insufficient documentation

## 2019-05-13 DIAGNOSIS — F17210 Nicotine dependence, cigarettes, uncomplicated: Secondary | ICD-10-CM | POA: Insufficient documentation

## 2019-05-13 MED ORDER — CLINDAMYCIN HCL 300 MG PO CAPS: 300.0000 mg | ORAL_CAPSULE | Freq: Three times a day (TID) | ORAL | 0 refills | Status: DC

## 2019-05-13 NOTE — ED Triage Notes (Signed)
Glenda Mann presents with a paronychia to the middle right phalange. She reprots it began after she got her nails done on Thursday. She took some left over clindamycin to try to stop the infection. She denies drainage.

## 2019-05-13 NOTE — ED Notes (Signed)
Provider at bedside

## 2019-05-13 NOTE — ED Notes (Signed)
Patient right middle finger soaking at bedside.

## 2019-05-14 NOTE — ED Provider Notes (Signed)
[facility] EMERGENCY DEPARTMENT Provider Note   CSN: [id number] Arrival date & time: 05/13/19  2102     History   Chief Complaint Chief Complaint  Patient presents with  . Finger infection    HPI Glenda Mann is a 33 y.o. female.   HPI   33yF with paronychia to R middle finger. Onset Thursday. Persistent since. Pain and swelling. No drainage. No fever.   Past Medical History:  Diagnosis Date  . BV (bacterial vaginosis)   . Chlamydia   . Gallstones   . Gestational diabetes   . Shingles     Patient Active Problem List   Diagnosis Date Noted  . 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 CHOLECYSTECTOMY    . INCISE AND DRAIN ABCESS       OB History    Gravida  2   Para  1   Term      Preterm      AB      Living        SAB      TAB      Ectopic      Multiple      Live Births              Home Medications    Prior to Admission medications   Medication Sig Start Date End Date Taking? Authorizing Provider  clindamycin (CLEOCIN) 300 MG capsule Take 1 capsule (300 mg total) by mouth 3 (three) times daily. 05/13/19   Raeford RazorKohut, Hiedi Touchton, MD  glipiZIDE (GLUCOTROL) 10 MG tablet Take 10 mg by mouth daily before breakfast.    [provider]  levonorgestrel (MIRENA) 20 MCG/24HR IUD by Intrauterine route.    [provider]  metFORMIN (GLUCOPHAGE) 1000 MG tablet Take 1,000 mg by mouth 2 (two) times daily with a meal.    [provider]  oxyCODONE-acetaminophen (PERCOCET) 5-325 MG tablet Take 1-2 tablets by mouth every 4 (four) hours as needed. 05/20/17   Loren RacerYelverton, David, MD  rosuvastatin (CRESTOR) 20 MG tablet Take 20 mg by mouth daily.    [provider]  Semaglutide (OZEMPIC, 1 MG/DOSE, [place]) Inject 1 mg/mL into the skin once a week.    [provider]    Family History Family History  Problem Relation Age of Onset  . Migraines Mother     Social  History Social History   Tobacco Use  . Smoking status: Current Every Day Smoker    Packs/day: 0.50    Types: Cigarettes  . Smokeless tobacco: Never Used  Substance Use Topics  . Alcohol use: Yes    Alcohol/week: 4.0 standard drinks    Types: 4 Standard drinks or equivalent per week    Comment: drinks on weekends  . Drug use: No     Allergies   Bactrim [sulfamethoxazole-trimethoprim], Penicillins, and Reglan [metoclopramide]   Review of Systems Review of Systems  All systems reviewed and negative, other than as noted in HPI.  Physical Exam Updated Vital Signs BP (!) 160/98   Pulse (!) 108   Temp 98.3 F (36.8 C) (Oral)   Resp 18   Ht 5\' 5"  (1.651 m)   Wt 107.5 kg   SpO2 100%   BMI 39.44 kg/m   Physical Exam Vitals signs and nursing note reviewed.  Constitutional:      General: She is not in acute distress.    Appearance: She is well-developed.  HENT:  Head: Normocephalic and atraumatic.  Eyes:     General:        Right eye: No discharge.        Left eye: No discharge.     Conjunctiva/sclera: Conjunctivae normal.  Neck:     Musculoskeletal: Neck supple.  Cardiovascular:     Rate and Rhythm: Normal rate and regular rhythm.     Heart sounds: Normal heart sounds. No murmur. No friction rub. No gallop.   Pulmonary:     Effort: Pulmonary effort is normal. No respiratory distress.     Breath sounds: Normal breath sounds.  Abdominal:     General: There is no distension.     Palpations: Abdomen is soft.     Tenderness: There is no abdominal tenderness.  Musculoskeletal:     Comments: Paronychia ulnar aspect R middle finger. Pad soft.   Skin:    General: Skin is warm and dry.  Neurological:     Mental Status: She is alert.  Psychiatric:        Behavior: Behavior normal.        Thought Content: Thought content normal.      ED Treatments / Results  Labs (all labs ordered are listed, but only abnormal results are displayed) Labs Reviewed - No data  to display  EKG    Radiology No results found.  Procedures Procedures (including critical care time)  INCISION AND DRAINAGE Performed by: Virgel Manifold Consent: Verbal consent obtained. Risks and benefits: risks, benefits and alternatives were discussed Type: abscess  Body area: R middle finger  Anesthesia: local infiltration  Incision was made with a scalpel.  Local anesthetic: none  Anesthetic total: n/a Complexity: complex Blunt dissection to break up loculations  Drainage: purulent  Drainage amount: moderate Packing material: none Patient tolerance: Patient tolerated the procedure well with no immediate complications.    Medications Ordered in ED Medications - No data to display   Initial Impression / Assessment and Plan / ED Course  I have reviewed the triage vital signs and the nursing notes.  Pertinent labs & imaging results that were available during my care of the patient were reviewed by me and considered in my medical decision making (see chart for details).   Paronychia R middle finger. I&D'd. No felon. Continued wound care and return precautions discussed.   Final Clinical Impressions(s) / ED Diagnoses   Final diagnoses:  Paronychia of finger of right hand    ED Discharge Orders         Ordered    clindamycin (CLEOCIN) 300 MG capsule  3 times daily     05/13/19 2202           Virgel Manifold, MD 05/14/19 1125

## 2019-05-29 ENCOUNTER — Emergency Department (HOSPITAL_BASED_OUTPATIENT_CLINIC_OR_DEPARTMENT_OTHER)
Admission: EM | Admit: 2019-05-29 | Discharge: 2019-05-29 | Disposition: A | Discharge status: Home / Self Care | Payer: Medicaid Other | Attending: Emergency Medicine | Admitting: Emergency Medicine

## 2019-05-29 ENCOUNTER — Encounter (HOSPITAL_BASED_OUTPATIENT_CLINIC_OR_DEPARTMENT_OTHER): Payer: Self-pay | Admitting: *Deleted

## 2019-05-29 ENCOUNTER — Other Ambulatory Visit: Payer: Self-pay

## 2019-05-29 DIAGNOSIS — L02411 Cutaneous abscess of right axilla: Secondary | ICD-10-CM | POA: Insufficient documentation

## 2019-05-29 DIAGNOSIS — F17210 Nicotine dependence, cigarettes, uncomplicated: Secondary | ICD-10-CM | POA: Insufficient documentation

## 2019-05-29 DIAGNOSIS — L03011 Cellulitis of right finger: Secondary | ICD-10-CM

## 2019-05-29 DIAGNOSIS — Z79899 Other long term (current) drug therapy: Secondary | ICD-10-CM | POA: Insufficient documentation

## 2019-05-29 DIAGNOSIS — Z7984 Long term (current) use of oral hypoglycemic drugs: Secondary | ICD-10-CM | POA: Insufficient documentation

## 2019-05-29 DIAGNOSIS — L02511 Cutaneous abscess of right hand: Secondary | ICD-10-CM | POA: Insufficient documentation

## 2019-05-29 DIAGNOSIS — L0291 Cutaneous abscess, unspecified: Secondary | ICD-10-CM

## 2019-05-29 LAB — PREGNANCY, URINE: Preg Test, Ur: NEGATIVE

## 2019-05-29 MED ORDER — DOXYCYCLINE HYCLATE 100 MG PO CAPS: 100.0000 mg | ORAL_CAPSULE | Freq: Two times a day (BID) | ORAL | 0 refills | Status: AC

## 2019-05-29 MED ORDER — LIDOCAINE HCL (PF) 1 % IJ SOLN
20.0000 mL | Freq: Once | INTRAMUSCULAR | Status: AC
  Administered 2019-05-29: 19:00:00 20 mL via INTRADERMAL
  Filled 2019-05-29: qty 20

## 2019-05-29 MED ORDER — IBUPROFEN 400 MG PO TABS
400.0000 mg | ORAL_TABLET | Freq: Once | ORAL | Status: AC | PRN
  Administered 2019-05-29: 400 mg via ORAL
  Filled 2019-05-29: qty 1

## 2019-05-29 NOTE — ED Provider Notes (Signed)
[facility] EMERGENCY DEPARTMENT Provider Note   CSN: [id number] Arrival date & time: 05/29/19  1745    History   Chief Complaint Chief Complaint  Patient presents with  . Abscess    HPI Glenda Mann is a 33 y.o. female who presents for evaluation of 2 complaints.  Patient reports that for the last 2 days, she has had pain, redness, swelling noted to right axilla.  She reports that since onset, is gotten bigger and more painful in size.  Pain is worse with palpation of the area as well as movement of the right upper extremity.  She has been able to move her right arm though.  She also reports paronychia to the right third digit.  She states is been an issue has been ongoing for several weeks.  She has been seen in the ED previously for evaluation of same symptoms and had it drained on multiple occasions.  She states that it keeps returning.  Patient states that she does not bite her nails.  She does attend a nail salon.  Patient denies any numbness/weakness.      The history is provided by the patient.    Past Medical History:  Diagnosis Date  . BV (bacterial vaginosis)   . Chlamydia   . Gallstones   . Gestational diabetes   . Shingles     Patient Active Problem List   Diagnosis Date Noted  . 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 CHOLECYSTECTOMY    . INCISE AND DRAIN ABCESS       OB History    Gravida  2   Para  1   Term      Preterm      AB      Living        SAB      TAB      Ectopic      Multiple      Live Births               Home Medications    Prior to Admission medications   Medication Sig Start Date End Date Taking? Authorizing Provider  doxycycline (VIBRAMYCIN) 100 MG capsule Take 1 capsule (100 mg total) by mouth 2 (two) times daily for 7 days. 05/29/19 06/05/19  Graciella FreerLayden, Lindsey A, PA-C  glipiZIDE (GLUCOTROL) 10 MG tablet Take 10 mg by mouth daily before breakfast.     [provider]  levonorgestrel (MIRENA) 20 MCG/24HR IUD by Intrauterine route.    [provider]  metFORMIN (GLUCOPHAGE) 1000 MG tablet Take 1,000 mg by mouth 2 (two) times daily with a meal.    [provider]  oxyCODONE-acetaminophen (PERCOCET) 5-325 MG tablet Take 1-2 tablets by mouth every 4 (four) hours as needed. 05/20/17   Loren RacerYelverton, David, MD  rosuvastatin (CRESTOR) 20 MG tablet Take 20 mg by mouth daily.    [provider]  Semaglutide (OZEMPIC, 1 MG/DOSE, [place]) Inject 1 mg/mL into the skin once a week.    [provider]    Family History Family History  Problem Relation Age of Onset  . Migraines Mother     Social History Social History   Tobacco Use  . Smoking status: Current Every Day Smoker    Packs/day: 0.50    Types: Cigarettes  . Smokeless tobacco: Never Used  Substance Use Topics  . Alcohol use: Yes    Alcohol/week: 4.0 standard drinks    Types:  4 Standard drinks or equivalent per week    Comment: drinks on weekends  . Drug use: No     Allergies   Bactrim [sulfamethoxazole-trimethoprim], Penicillins, and Reglan [metoclopramide]   Review of Systems Review of Systems  Constitutional: Negative for fever.  Skin: Positive for color change and wound.  Neurological: Negative for weakness and numbness.  All other systems reviewed and are negative.    Physical Exam Updated Vital Signs BP 127/82   Pulse 99   Temp 98.4 F (36.9 C)   Resp 18   Ht 5\' 5"  (1.651 m)   Wt 107.5 kg   SpO2 98%   BMI 39.44 kg/m   Physical Exam Vitals signs and nursing note reviewed.  Constitutional:      Appearance: She is well-developed.  HENT:     Head: Normocephalic and atraumatic.  Eyes:     General: No scleral icterus.       Right eye: No discharge.        Left eye: No discharge.     Conjunctiva/sclera: Conjunctivae normal.  Cardiovascular:     Pulses:          Radial pulses are 2+ on the right side and 2+ on the left  side.  Pulmonary:     Effort: Pulmonary effort is normal.  Musculoskeletal:     Comments: Right third digit with paronychia noted to proximal and lateral nail bed.  No evidence of felon.  Full range of motion of right third digit intact without any difficulty.  Skin:    General: Skin is warm and dry.     Capillary Refill: Capillary refill takes less than 2 seconds.     Comments: Warmth, erythema, induration noted to right axilla with about a 2.5 cm central area of fluctuance.   Neurological:     Mental Status: She is alert.  Psychiatric:        Speech: Speech normal.        Behavior: Behavior normal.      ED Treatments / Results  Labs (all labs ordered are listed, but only abnormal results are displayed) Labs Reviewed  PREGNANCY, URINE    EKG None  Radiology No results found.  Procedures .Marland KitchenIncision and Drainage  Date/Time: 05/29/2019 7:58 PM Performed by: Volanda Napoleon, PA-C Authorized by: Volanda Napoleon, PA-C   Consent:    Consent obtained:  Verbal   Consent given by:  Patient   Risks discussed:  Bleeding, incomplete drainage, pain and damage to other organs   Alternatives discussed:  No treatment Universal protocol:    Procedure explained and questions answered to patient or proxy's satisfaction: yes     Relevant documents present and verified: yes     Test results available and properly labeled: yes     Imaging studies available: yes     Required blood products, implants, devices, and special equipment available: yes     Site/side marked: yes     Immediately prior to procedure a time out was called: yes     Patient identity confirmed:  Verbally with patient Location:    Type:  Abscess   Size:  2.5   Location: Right axilla. Pre-procedure details:    Skin preparation:  Betadine Anesthesia (see MAR for exact dosages):    Anesthesia method:  Local infiltration   Local anesthetic:  Lidocaine 1% w/o epi Procedure type:    Complexity:  Complex Procedure  details:    Incision types:  Single straight   Incision  depth:  Subcutaneous   Scalpel blade:  11   Wound management:  Probed and deloculated, irrigated with saline and extensive cleaning   Drainage:  Purulent   Drainage amount:  Copious   Wound treatment:  Drain placed   Packing materials:  1/4 in gauze Post-procedure details:    Patient tolerance of procedure:  Tolerated well, no immediate complications .Marland Kitchen.Incision and Drainage  Date/Time: 05/29/2019 7:58 PM Performed by: Maxwell CaulLayden, Lindsey A, PA-C Authorized by: Maxwell CaulLayden, Lindsey A, PA-C   Consent:    Consent obtained:  Verbal   Consent given by:  Patient   Risks discussed:  Bleeding, incomplete drainage, pain and damage to other organs   Alternatives discussed:  No treatment Universal protocol:    Procedure explained and questions answered to patient or proxy's satisfaction: yes     Relevant documents present and verified: yes     Test results available and properly labeled: yes     Imaging studies available: yes     Required blood products, implants, devices, and special equipment available: yes     Site/side marked: yes     Immediately prior to procedure a time out was called: yes     Patient identity confirmed:  Verbally with patient Location:    Type:  Abscess   Location:  Upper extremity   Upper extremity location:  Finger   Finger location:  R long finger Pre-procedure details:    Skin preparation:  Betadine Anesthesia (see MAR for exact dosages):    Anesthesia method:  Nerve block   Block needle gauge:  25 G   Block anesthetic:  Lidocaine 1% w/o epi   Block injection procedure:  Anatomic landmarks identified, introduced needle, incremental injection and negative aspiration for blood   Block outcome:  Anesthesia achieved Procedure type:    Complexity:  Complex Procedure details:    Incision types:  Single straight   Incision depth:  Subcutaneous   Scalpel blade:  11   Wound management:  Probed and deloculated,  irrigated with saline and extensive cleaning   Drainage:  Purulent   Drainage amount:  Moderate Post-procedure details:    Patient tolerance of procedure:  Tolerated well, no immediate complications   (including critical care time)  Medications Ordered in ED Medications  ibuprofen (ADVIL) tablet 400 mg (400 mg Oral Given 05/29/19 1838)  lidocaine (PF) (XYLOCAINE) 1 % injection 20 mL (20 mLs Intradermal Given by Other 05/29/19 1904)     Initial Impression / Assessment and Plan / ED Course  I have reviewed the triage vital signs and the nursing notes.  Pertinent labs & imaging results that were available during my care of the patient were reviewed by me and considered in my medical decision making (see chart for details).        33 year old female who presents for evaluation of 2 complaints.  Reports 2 days of axilla pain, redness, swelling.  Also reports paronychia to right third digit that is been ongoing.  Has been seen in the ED multiple times for drainage and states that it continues to recur.  No fevers. Patient is afebrile, non-toxic appearing, sitting comfortably on examination table. Vital signs reviewed and stable.  Exam, she has warmth, erythema, edema noted right axilla with about a 2.5 cm central area of fluctuance consistent with abscess.  Additionally, right third digit is consistent with paronychia.  Exam is not concerning for felon.  History/physical exam not concerning for flexor tenosynovitis.  I discussed treatment options with patient, including  incision and drainage.  Patient is agreeable.  Incision and drainage of right axilla as documented above.  Patient tolerated procedure well.  Drainage of paronychia documented as above.  Patient tolerated procedure well.  She has recurrent paronychia and has been seen multiple times.  Additionally, she has a history of diabetes that sounds like it is poorly controlled.  She has been on clindamycin most recently.  We will plan a course  of doxycycline, which she has tolerated in the past. Urine pregnancy negative.  Additionally, given that this is been a recurrent issue, will plan to give follow-up with outpatient hand. At this time, patient exhibits no emergent life-threatening condition that require further evaluation in ED or admission.  Patient had ample opportunity for questions and discussion. All patient's questions were answered with full understanding. Strict return precautions discussed. Patient expresses understanding and agreement to plan.   Patient had ample opportunity for questions and discussion. All patient's questions were answered with full understanding.  Final Clinical Impressions(s) / ED Diagnoses   Final diagnoses:  Abscess  Paronychia of finger of right hand    ED Discharge Orders         Ordered    doxycycline (VIBRAMYCIN) 100 MG capsule  2 times daily     05/29/19 2003           Rosana HoesLayden, Lindsey A, PA-C 05/29/19 2100    Pricilla LovelessGoldston, Scott, MD 06/02/19 [phone]

## 2019-05-29 NOTE — ED Triage Notes (Signed)
Abcess to right axillary x 2 days also recurrent infection to right 3rd finger

## 2019-05-29 NOTE — Discharge Instructions (Signed)
Apply warm compresses to the area or soak the area in warm water to help continue express drainage.   Continue with warm soaks To help with continued drainage.  Keep the wound clean and dry. Gently wash the wound with soap and water and make sure to pat it dry.   Take antibiotic and complete the entire course.   You can take Tylenol or Ibuprofen as directed for pain.  Followup with the Emergency Dept  in 2 days for wound recheck and packing removal.   Return to the Emergency Department if you experienced any worsening/spreading redness or swelling, fever, worsening pain, or any other worsening or concerning symptoms.

## 2019-06-11 ENCOUNTER — Other Ambulatory Visit: Payer: Self-pay

## 2019-06-11 ENCOUNTER — Emergency Department (HOSPITAL_BASED_OUTPATIENT_CLINIC_OR_DEPARTMENT_OTHER)
Admission: EM | Admit: 2019-06-11 | Discharge: 2019-06-11 | Disposition: A | Discharge status: Home / Self Care | Payer: Medicaid Other | Attending: Emergency Medicine | Admitting: Emergency Medicine

## 2019-06-11 ENCOUNTER — Encounter (HOSPITAL_BASED_OUTPATIENT_CLINIC_OR_DEPARTMENT_OTHER): Payer: Self-pay | Admitting: Emergency Medicine

## 2019-06-11 DIAGNOSIS — B9689 Other specified bacterial agents as the cause of diseases classified elsewhere: Secondary | ICD-10-CM | POA: Insufficient documentation

## 2019-06-11 DIAGNOSIS — N760 Acute vaginitis: Secondary | ICD-10-CM | POA: Insufficient documentation

## 2019-06-11 DIAGNOSIS — Z7984 Long term (current) use of oral hypoglycemic drugs: Secondary | ICD-10-CM | POA: Insufficient documentation

## 2019-06-11 DIAGNOSIS — F17210 Nicotine dependence, cigarettes, uncomplicated: Secondary | ICD-10-CM | POA: Insufficient documentation

## 2019-06-11 DIAGNOSIS — R350 Frequency of micturition: Secondary | ICD-10-CM | POA: Insufficient documentation

## 2019-06-11 DIAGNOSIS — N898 Other specified noninflammatory disorders of vagina: Secondary | ICD-10-CM | POA: Insufficient documentation

## 2019-06-11 LAB — URINALYSIS, ROUTINE W REFLEX MICROSCOPIC
Bilirubin Urine: NEGATIVE
Glucose, UA: 100 mg/dL — AB
Hgb urine dipstick: NEGATIVE
Ketones, ur: NEGATIVE mg/dL
Leukocytes,Ua: NEGATIVE
Nitrite: NEGATIVE
Protein, ur: NEGATIVE mg/dL
Specific Gravity, Urine: >1.03 — ABNORMAL HIGH (ref 1.005–1.030)
pH: 6 (ref 5.0–8.0)

## 2019-06-11 LAB — WET PREP, GENITAL
Sperm: NONE SEEN
Trich, Wet Prep: NONE SEEN
Yeast Wet Prep HPF POC: NONE SEEN

## 2019-06-11 LAB — PREGNANCY, URINE: Preg Test, Ur: NEGATIVE

## 2019-06-11 MED ORDER — METRONIDAZOLE 500 MG PO TABS: 500.0000 mg | ORAL_TABLET | Freq: Two times a day (BID) | ORAL | 0 refills | Status: DC

## 2019-06-11 NOTE — ED Notes (Signed)
Saltines, Cheese, and Ginger Ale given per pt request.

## 2019-06-11 NOTE — ED Provider Notes (Signed)
[facility] EMERGENCY DEPARTMENT Provider Note   CSN: [id number] Arrival date & time: 06/11/19  1222    History   Chief Complaint Chief Complaint  Patient presents with  . Abdominal Pain    HPI Glenda Mann is a 33 y.o. female.     33yo female with complaint of RLQ abdominal pain x 2 days, intermittent, sharp in nature, does not radiate, nothing make pain better or worse, associated with clear vaginal dc with a sour odor. Pain lasts about 10 minutes and completely resolves. Denies nausea, vomiting, changes in bowel habits. Reports urinary frequency. Patient is currently on antibiotics for an abscess on her finger and right axillary area.     Past Medical History:  Diagnosis Date  . BV (bacterial vaginosis)   . Chlamydia   . Gallstones   . Gestational diabetes   . Shingles     Patient Active Problem List   Diagnosis Date Noted  . 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 CHOLECYSTECTOMY    . INCISE AND DRAIN ABCESS       OB History    Gravida  2   Para  1   Term      Preterm      AB      Living        SAB      TAB      Ectopic      Multiple      Live Births               Home Medications    Prior to Admission medications   Medication Sig Start Date End Date Taking? Authorizing Provider  glipiZIDE (GLUCOTROL) 10 MG tablet Take 10 mg by mouth daily before breakfast.    [provider]  levonorgestrel (MIRENA) 20 MCG/24HR IUD by Intrauterine route.    [provider]  metFORMIN (GLUCOPHAGE) 1000 MG tablet Take 1,000 mg by mouth 2 (two) times daily with a meal.    [provider]  metroNIDAZOLE (FLAGYL) 500 MG tablet Take 1 tablet (500 mg total) by mouth 2 (two) times daily. 06/11/19   Jeannie FendMurphy, [place] A, PA-C  oxyCODONE-acetaminophen (PERCOCET) 5-325 MG tablet Take 1-2 tablets by mouth every 4 (four) hours as needed. 05/20/17   Loren RacerYelverton, David, MD   rosuvastatin (CRESTOR) 20 MG tablet Take 20 mg by mouth daily.    [provider]  Semaglutide (OZEMPIC, 1 MG/DOSE, [place]) Inject 1 mg/mL into the skin once a week.    [provider]    Family History Family History  Problem Relation Age of Onset  . Migraines Mother     Social History Social History   Tobacco Use  . Smoking status: Current Every Day Smoker    Packs/day: 0.50    Types: Cigarettes  . Smokeless tobacco: Never Used  Substance Use Topics  . Alcohol use: Yes    Alcohol/week: 4.0 standard drinks    Types: 4 Standard drinks or equivalent per week    Comment: drinks on weekends  . Drug use: No     Allergies   Bactrim [sulfamethoxazole-trimethoprim], Penicillins, and Reglan [metoclopramide]   Review of Systems Review of Systems  Constitutional: Negative for chills, diaphoresis and fever.  Gastrointestinal: Positive for abdominal pain. Negative for abdominal distention, constipation, diarrhea, nausea and vomiting.  Genitourinary: Positive for frequency and vaginal discharge. Negative for dysuria and urgency.  Musculoskeletal: Negative for arthralgias, back pain and myalgias.  Skin: Negative for rash and wound.  Allergic/Immunologic: Negative for immunocompromised state.  Neurological: Negative for weakness.  Hematological: Negative for adenopathy. Does not bruise/bleed easily.  All other systems reviewed and are negative.    Physical Exam Updated Vital Signs BP (!) 138/92 (BP Location: Right Arm)   Pulse 89   Temp 98.5 F (36.9 C) (Oral)   Resp 18   Ht 5\' 5"  (1.651 m)   Wt 107.5 kg   SpO2 99%   BMI 39.44 kg/m   Physical Exam Vitals signs and nursing note reviewed. Exam conducted with a chaperone present.  Constitutional:      General: She is not in acute distress.    Appearance: She is well-developed. She is not diaphoretic.  HENT:     Head: Normocephalic and atraumatic.  Cardiovascular:     Rate and Rhythm: Normal rate and  regular rhythm.  Pulmonary:     Effort: Pulmonary effort is normal.     Breath sounds: Normal breath sounds.  Abdominal:     General: There is no distension.     Palpations: Abdomen is soft.     Tenderness: There is no abdominal tenderness.  Genitourinary:    Vagina: Vaginal discharge present.     Cervix: No cervical motion tenderness.     Uterus: Normal. Not tender.      Adnexa: Right adnexa normal and left adnexa normal.       Right: No mass or tenderness.         Left: No mass or tenderness.       Comments: No pelvic tenderness, small amount of white vaginal discharge. Skin:    General: Skin is warm and dry.     Findings: No erythema or rash.  Neurological:     Mental Status: She is alert and oriented to person, place, and time.  Psychiatric:        Behavior: Behavior normal.      ED Treatments / Results  Labs (all labs ordered are listed, but only abnormal results are displayed) Labs Reviewed  WET PREP, GENITAL - Abnormal; Notable for the following components:      Result Value   Clue Cells Wet Prep HPF POC PRESENT (*)    WBC, Wet Prep HPF POC MODERATE (*)    All other components within normal limits  URINALYSIS, ROUTINE W REFLEX MICROSCOPIC - Abnormal; Notable for the following components:   Specific Gravity, Urine >1.030 (*)    Glucose, UA 100 (*)    All other components within normal limits  PREGNANCY, URINE  GC/CHLAMYDIA PROBE AMP ([place]) NOT AT [facility]    EKG None  Radiology No results found.  Procedures Procedures (including critical care time)  Medications Ordered in ED Medications - No data to display   Initial Impression / Assessment and Plan / ED Course  I have reviewed the triage vital signs and the nursing notes.  Pertinent labs & imaging results that were available during my care of the patient were reviewed by me and considered in my medical decision making (see chart for details).  Clinical Course as of Jun 10 1508  Wed Jun 10, 3520   [id number] 33 year old female presents with complaint of right lower quadrant abdominal pain onset this morning with clear vaginal discharge with foul odor.  Patient is sexually active, no new partners.  Patient denies any symptoms in her partner.  On exam, abdomen is soft and nontender.  On pelvic exam patient has a small amount of  white vaginal discharge, no cervical motion tenderness, no adnexal tenderness.  Patient requested prophylactic treatment for STDs and was annoyed by having to wait for her wet prep results today.  Patient's wet prep is positive for bacterial vaginosis, she is already prescribed clindamycin for a skin infection, advised this will cover her BV and she may continue taking the clindamycin.  If patient's STD tests are negative and if she is still experiencing clear vaginal discharge with odor she may take Flagyl as prescribed and complete the whole course.  Advised not to drink alcohol if she takes Flagyl.  Urinalysis is negative for UTI, does have a small minor glucose in her urine.  Pregnancy test is negative.  Patient was advised to call for her test results or check my chart in 3 to 5 days and if gonorrhea chlamydia tests are positive that she may go to her PCP or health department for treatment.   [LM]    Clinical Course User Index [LM] Jeannie FendMurphy, Marigold Mom A, PA-C      Final Clinical Impressions(s) / ED Diagnoses   Final diagnoses:  Bacterial vaginosis    ED Discharge Orders         Ordered    metroNIDAZOLE (FLAGYL) 500 MG tablet  2 times daily     06/11/19 1501           Alden HippMurphy, [place] A, PA-C 06/11/19 1509    Terrilee FilesButler, Michael C, MD 06/12/19 1045

## 2019-06-11 NOTE — ED Triage Notes (Signed)
Sharp abdominal pain x2 days.  Clear vaginal discharge with sour smell.

## 2019-06-11 NOTE — Discharge Instructions (Addendum)
The Clindamycin you are taking for your skin infection should treat your bacterial vaginosis. If this does not improve and if your STD tests are negative, take Flagyl as prescribed and complete the full course. Do not drink alcohol while taking Flagyl.  Call for your test results or check your MyChart in the next 3 to 5 days for your gonorrhea and Chlamydia results, if either these are positive you may go to your doctor or the health department for treatment.

## 2019-06-11 NOTE — ED Notes (Signed)
Pelvic cart to bedside 

## 2019-06-12 LAB — GC/CHLAMYDIA PROBE AMP (~~LOC~~) NOT AT ARMC
Chlamydia: NEGATIVE
Neisseria Gonorrhea: NEGATIVE

## 2019-06-18 ENCOUNTER — Other Ambulatory Visit: Payer: Self-pay

## 2019-06-18 ENCOUNTER — Encounter (HOSPITAL_BASED_OUTPATIENT_CLINIC_OR_DEPARTMENT_OTHER): Payer: Self-pay

## 2019-06-18 ENCOUNTER — Emergency Department (HOSPITAL_BASED_OUTPATIENT_CLINIC_OR_DEPARTMENT_OTHER)
Admission: EM | Admit: 2019-06-18 | Discharge: 2019-06-18 | Disposition: A | Discharge status: Home / Self Care | Payer: Medicaid Other | Attending: Emergency Medicine | Admitting: Emergency Medicine

## 2019-06-18 DIAGNOSIS — Z8619 Personal history of other infectious and parasitic diseases: Secondary | ICD-10-CM

## 2019-06-18 DIAGNOSIS — R300 Dysuria: Secondary | ICD-10-CM | POA: Insufficient documentation

## 2019-06-18 DIAGNOSIS — R350 Frequency of micturition: Secondary | ICD-10-CM | POA: Insufficient documentation

## 2019-06-18 DIAGNOSIS — Z79899 Other long term (current) drug therapy: Secondary | ICD-10-CM | POA: Insufficient documentation

## 2019-06-18 DIAGNOSIS — N9089 Other specified noninflammatory disorders of vulva and perineum: Secondary | ICD-10-CM | POA: Insufficient documentation

## 2019-06-18 DIAGNOSIS — F17210 Nicotine dependence, cigarettes, uncomplicated: Secondary | ICD-10-CM | POA: Insufficient documentation

## 2019-06-18 DIAGNOSIS — Z7984 Long term (current) use of oral hypoglycemic drugs: Secondary | ICD-10-CM | POA: Insufficient documentation

## 2019-06-18 DIAGNOSIS — R21 Rash and other nonspecific skin eruption: Secondary | ICD-10-CM | POA: Insufficient documentation

## 2019-06-18 LAB — URINALYSIS, MICROSCOPIC (REFLEX): RBC / HPF: >50 RBC/hpf (ref 0–5)

## 2019-06-18 LAB — URINALYSIS, ROUTINE W REFLEX MICROSCOPIC
Bilirubin Urine: NEGATIVE
Glucose, UA: >=500 mg/dL — AB
Ketones, ur: NEGATIVE mg/dL
Leukocytes,Ua: NEGATIVE
Nitrite: NEGATIVE
Protein, ur: NEGATIVE mg/dL
Specific Gravity, Urine: 1.02 (ref 1.005–1.030)
pH: 6 (ref 5.0–8.0)

## 2019-06-18 LAB — PREGNANCY, URINE: Preg Test, Ur: NEGATIVE

## 2019-06-18 MED ORDER — METRONIDAZOLE 500 MG PO TABS: 500.0000 mg | ORAL_TABLET | Freq: Two times a day (BID) | ORAL | 0 refills | Status: DC

## 2019-06-18 MED ORDER — VALACYCLOVIR HCL 1 G PO TABS: 1000.0000 mg | ORAL_TABLET | Freq: Three times a day (TID) | ORAL | 0 refills | Status: DC

## 2019-06-18 MED ORDER — NYSTATIN 100000 UNIT/GM EX POWD: Freq: Four times a day (QID) | CUTANEOUS | 0 refills | Status: DC

## 2019-06-18 NOTE — ED Notes (Signed)
ED Provider at bedside. 

## 2019-06-18 NOTE — ED Triage Notes (Signed)
C/o dysuria and burning to "clitoris"-pt states she has hx of herpes and feels may be an "outbreak"-NAD-steady gait

## 2019-06-18 NOTE — ED Provider Notes (Signed)
[facility] EMERGENCY DEPARTMENT Provider Note   CSN: [id number] Arrival date & time: 06/18/19  1840     History   Chief Complaint Chief Complaint  Patient presents with  . Dysuria    HPI Glenda Mann is a 33 y.o. female.     Patient is a 33 year old female with past medical history of BV and chlamydia.  She presents today for evaluation of pain and burning in her pelvis.  This occurs when she urinates.  She also describes urinary frequency.  She was diagnosed with genital herpes last year and is concerned this may be a flareup.  She denies any fevers or chills.  She was seen approximately 1 week ago and had a pelvic exam performed.  She was told she might have BV.  She was given a prescription, however she was unable to fill this due to finances.  She does report using some of her sisters medication who has had the same condition before.  The history is provided by the patient.  Dysuria Pain quality:  Burning Pain severity:  Moderate Onset quality:  Sudden Duration:  2 days Timing:  Constant Progression:  Worsening Chronicity:  New Recent urinary tract infections: no   Relieved by:  Nothing   Past Medical History:  Diagnosis Date  . BV (bacterial vaginosis)   . Chlamydia   . Gallstones   . Gestational diabetes   . Shingles     Patient Active Problem List   Diagnosis Date Noted  . 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 CHOLECYSTECTOMY    . INCISE AND DRAIN ABCESS       OB History    Gravida  2   Para  1   Term      Preterm      AB      Living        SAB      TAB      Ectopic      Multiple      Live Births               Home Medications    Prior to Admission medications   Medication Sig Start Date End Date Taking? Authorizing Provider  glipiZIDE (GLUCOTROL) 10 MG tablet Take 10 mg by mouth daily before breakfast.    [provider]  levonorgestrel (MIRENA)  20 MCG/24HR IUD by Intrauterine route.    [provider]  metFORMIN (GLUCOPHAGE) 1000 MG tablet Take 1,000 mg by mouth 2 (two) times daily with a meal.    [provider]  metroNIDAZOLE (FLAGYL) 500 MG tablet Take 1 tablet (500 mg total) by mouth 2 (two) times daily. 06/11/19   Jeannie FendMurphy, Laura A, PA-C  oxyCODONE-acetaminophen (PERCOCET) 5-325 MG tablet Take 1-2 tablets by mouth every 4 (four) hours as needed. 05/20/17   Loren RacerYelverton, David, MD  rosuvastatin (CRESTOR) 20 MG tablet Take 20 mg by mouth daily.    [provider]  Semaglutide (OZEMPIC, 1 MG/DOSE, [place]) Inject 1 mg/mL into the skin once a week.    [provider]    Family History Family History  Problem Relation Age of Onset  . Migraines Mother     Social History Social History   Tobacco Use  . Smoking status: Current Every Day Smoker    Packs/day: 0.50    Types: Cigarettes  . Smokeless tobacco: Never Used  Substance Use Topics  . Alcohol use: Yes  Alcohol/week: 4.0 standard drinks    Types: 4 Standard drinks or equivalent per week    Comment: drinks on weekends  . Drug use: No     Allergies   Bactrim [sulfamethoxazole-trimethoprim], Penicillins, and Reglan [metoclopramide]   Review of Systems Review of Systems  Genitourinary: Positive for dysuria.  All other systems reviewed and are negative.    Physical Exam Updated Vital Signs BP 133/86 (BP Location: Left Arm)   Pulse 95   Temp 99.4 F (37.4 C) (Oral)   Resp 18   Ht 5\' 5"  (1.651 m)   Wt 108.9 kg   SpO2 100%   BMI 39.94 kg/m   Physical Exam Vitals signs and nursing note reviewed.  Constitutional:      General: She is not in acute distress.    Appearance: Normal appearance. She is not ill-appearing, toxic-appearing or diaphoretic.  HENT:     Head: Normocephalic and atraumatic.  Pulmonary:     Effort: Pulmonary effort is normal.  Abdominal:     General: Bowel sounds are normal. There is no distension.      Palpations: Abdomen is soft.     Tenderness: There is no abdominal tenderness.  Genitourinary:    Comments: Examination of the perineum reveals a tender labia minora.  There are no obvious lesions present. Skin:    General: Skin is warm and dry.  Neurological:     Mental Status: She is alert.      ED Treatments / Results  Labs (all labs ordered are listed, but only abnormal results are displayed) Labs Reviewed  PREGNANCY, URINE  URINALYSIS, ROUTINE W REFLEX MICROSCOPIC    EKG None  Radiology No results found.  Procedures Procedures (including critical care time)  Medications Ordered in ED Medications - No data to display   Initial Impression / Assessment and Plan / ED Course  I have reviewed the triage vital signs and the nursing notes.  Pertinent labs & imaging results that were available during my care of the patient were reviewed by me and considered in my medical decision making (see chart for details).  Urinalysis shows microscopic blood, however is not suggestive of infection.  Physical examination reveals tender labia.  Patient is concerned that this is a flareup of her herpes.  She will be given Valtrex.  She also has a rash under her breast that is consistent with Candida.  She will be given nystatin powder.  She is to return as needed for any problems.  Patient did undergo a pelvic examination 1 week ago showing only BV.  She was unable to fill the Flagyl due to finances.  She will be given another prescription for this at her request.  Final Clinical Impressions(s) / ED Diagnoses   Final diagnoses:  None    ED Discharge Orders    None       Veryl Speak, MD 06/18/19 1941

## 2019-06-18 NOTE — Discharge Instructions (Addendum)
Begin taking Flagyl and Valtrex as prescribed.  Nystatin powder to the skin lesions as directed.  Follow-up with your primary doctor if symptoms or not improving in the next few days.

## 2019-08-05 ENCOUNTER — Encounter (HOSPITAL_BASED_OUTPATIENT_CLINIC_OR_DEPARTMENT_OTHER): Payer: Self-pay

## 2019-08-05 ENCOUNTER — Emergency Department (HOSPITAL_BASED_OUTPATIENT_CLINIC_OR_DEPARTMENT_OTHER)
Admission: EM | Admit: 2019-08-05 | Discharge: 2019-08-05 | Disposition: A | Discharge status: Home / Self Care | Payer: 59 | Attending: Emergency Medicine | Admitting: Emergency Medicine

## 2019-08-05 ENCOUNTER — Other Ambulatory Visit: Payer: Self-pay

## 2019-08-05 DIAGNOSIS — N739 Female pelvic inflammatory disease, unspecified: Secondary | ICD-10-CM

## 2019-08-05 DIAGNOSIS — N611 Abscess of the breast and nipple: Secondary | ICD-10-CM | POA: Insufficient documentation

## 2019-08-05 DIAGNOSIS — Z113 Encounter for screening for infections with a predominantly sexual mode of transmission: Secondary | ICD-10-CM | POA: Diagnosis not present

## 2019-08-05 DIAGNOSIS — F17210 Nicotine dependence, cigarettes, uncomplicated: Secondary | ICD-10-CM | POA: Insufficient documentation

## 2019-08-05 DIAGNOSIS — Z79899 Other long term (current) drug therapy: Secondary | ICD-10-CM | POA: Diagnosis not present

## 2019-08-05 DIAGNOSIS — Z7984 Long term (current) use of oral hypoglycemic drugs: Secondary | ICD-10-CM | POA: Insufficient documentation

## 2019-08-05 DIAGNOSIS — R197 Diarrhea, unspecified: Secondary | ICD-10-CM | POA: Diagnosis not present

## 2019-08-05 DIAGNOSIS — R1030 Lower abdominal pain, unspecified: Secondary | ICD-10-CM | POA: Diagnosis present

## 2019-08-05 LAB — COMPREHENSIVE METABOLIC PANEL
ALT: 18 U/L (ref 0–44)
AST: 18 U/L (ref 15–41)
Albumin: 3.6 g/dL (ref 3.5–5.0)
Alkaline Phosphatase: 51 U/L (ref 38–126)
Anion gap: 10 (ref 5–15)
BUN: 11 mg/dL (ref 6–20)
CO2: 22 mmol/L (ref 22–32)
Calcium: 8.7 mg/dL — ABNORMAL LOW (ref 8.9–10.3)
Chloride: 105 mmol/L (ref 98–111)
Creatinine, Ser: 0.48 mg/dL (ref 0.44–1.00)
GFR calc Af Amer: >60 mL/min (ref >60)
GFR calc non Af Amer: >60 mL/min (ref >60)
Glucose, Bld: 169 mg/dL — ABNORMAL HIGH (ref 70–99)
Potassium: 3.7 mmol/L (ref 3.5–5.1)
Sodium: 137 mmol/L (ref 135–145)
Total Bilirubin: 0.5 mg/dL (ref 0.3–1.2)
Total Protein: 6.9 g/dL (ref 6.5–8.1)

## 2019-08-05 LAB — URINALYSIS, ROUTINE W REFLEX MICROSCOPIC
Bilirubin Urine: NEGATIVE
Glucose, UA: NEGATIVE mg/dL
Hgb urine dipstick: NEGATIVE
Ketones, ur: NEGATIVE mg/dL
Leukocytes,Ua: NEGATIVE
Nitrite: NEGATIVE
Protein, ur: NEGATIVE mg/dL
Specific Gravity, Urine: >1.03 — ABNORMAL HIGH (ref 1.005–1.030)
pH: 6 (ref 5.0–8.0)

## 2019-08-05 LAB — CBC WITH DIFFERENTIAL/PLATELET
Abs Immature Granulocytes: 0.06 10*3/uL (ref 0.00–0.07)
Basophils Absolute: 0.1 10*3/uL (ref 0.0–0.1)
Basophils Relative: 0 %
Eosinophils Absolute: 0.4 10*3/uL (ref 0.0–0.5)
Eosinophils Relative: 3 %
HCT: 39.5 % (ref 36.0–46.0)
Hemoglobin: 12.3 g/dL (ref 12.0–15.0)
Immature Granulocytes: 0 %
Lymphocytes Relative: 30 %
Lymphs Abs: 4.4 10*3/uL — ABNORMAL HIGH (ref 0.7–4.0)
MCH: 27.7 pg (ref 26.0–34.0)
MCHC: 31.1 g/dL (ref 30.0–36.0)
MCV: 89 fL (ref 80.0–100.0)
Monocytes Absolute: 0.7 10*3/uL (ref 0.1–1.0)
Monocytes Relative: 5 %
Neutro Abs: 9.1 10*3/uL — ABNORMAL HIGH (ref 1.7–7.7)
Neutrophils Relative %: 62 %
Platelets: 189 10*3/uL (ref 150–400)
RBC: 4.44 MIL/uL (ref 3.87–5.11)
RDW: 13.2 % (ref 11.5–15.5)
WBC: 14.6 10*3/uL — ABNORMAL HIGH (ref 4.0–10.5)
nRBC: 0 % (ref 0.0–0.2)

## 2019-08-05 LAB — PREGNANCY, URINE: Preg Test, Ur: NEGATIVE

## 2019-08-05 LAB — WET PREP, GENITAL
Clue Cells Wet Prep HPF POC: NONE SEEN
Sperm: NONE SEEN
Trich, Wet Prep: NONE SEEN
Yeast Wet Prep HPF POC: NONE SEEN

## 2019-08-05 LAB — LIPASE, BLOOD: Lipase: 28 U/L (ref 11–51)

## 2019-08-05 MED ORDER — LIDOCAINE HCL (PF) 1 % IJ SOLN
INTRAMUSCULAR | Status: AC
  Filled 2019-08-05: qty 5

## 2019-08-05 MED ORDER — DOXYCYCLINE HYCLATE 100 MG PO TABS
100.0000 mg | ORAL_TABLET | Freq: Once | ORAL | Status: AC
  Administered 2019-08-05: 100 mg via ORAL
  Filled 2019-08-05: qty 1

## 2019-08-05 MED ORDER — AZITHROMYCIN 250 MG PO TABS
1000.0000 mg | ORAL_TABLET | Freq: Once | ORAL | Status: AC
  Administered 2019-08-05: 07:00:00 1000 mg via ORAL
  Filled 2019-08-05: qty 4

## 2019-08-05 MED ORDER — DOXYCYCLINE HYCLATE 100 MG PO CAPS: 100.0000 mg | ORAL_CAPSULE | Freq: Two times a day (BID) | ORAL | 0 refills | Status: DC

## 2019-08-05 MED ORDER — CEFTRIAXONE SODIUM 250 MG IJ SOLR
250.0000 mg | Freq: Once | INTRAMUSCULAR | Status: AC
  Administered 2019-08-05: 07:00:00 250 mg via INTRAMUSCULAR
  Filled 2019-08-05: qty 250

## 2019-08-05 NOTE — ED Provider Notes (Signed)
[facility] EMERGENCY DEPARTMENT Provider Note   CSN: [id number] Arrival date & time: 08/05/19  0543    History   Chief Complaint Chief Complaint  Patient presents with  . Abdominal Pain  . Diarrhea    HPI Glenda Mann is a 33 y.o. female.   The history is provided by the patient.  Abdominal Pain Associated symptoms: diarrhea   Diarrhea Associated symptoms: abdominal pain   She has history of gestational diabetes, chlamydia, bacterial vaginosis and comes in complaining of lower abdominal pain for the last 2 days.  Pain is dull and steady and she rates it at 6/10.  Nothing makes it better, nothing makes it worse.  She denies fever, chills, sweats.  She denies nausea or vomiting.  She has chronic diarrhea which has not changed from its baseline.  He thinks the diarrhea is because she takes metformin.  She has an IUD and cannot remember when her last menses was.  She has noted a clear vaginal discharge.  Similar pain to what she is having today has been diagnosed as bacterial vaginosis in the past.  She does admit to having unprotected intercourse.  She does wish to be tested for sexually transmitted infections.  As a separate complaint, she has noted an area of some purulent drainage from the medial aspect of her right breast.  This is been going on for approximately 1 month.  Past Medical History:  Diagnosis Date  . BV (bacterial vaginosis)   . Chlamydia   . Gallstones   . Gestational diabetes   . Shingles     Patient Active Problem List   Diagnosis Date Noted  . 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 CHOLECYSTECTOMY    . INCISE AND DRAIN ABCESS       OB History    Gravida  2   Para  1   Term      Preterm      AB      Living        SAB      TAB      Ectopic      Multiple      Live Births               Home Medications    Prior to Admission medications   Medication Sig Start  Date End Date Taking? Authorizing Provider  glipiZIDE (GLUCOTROL) 10 MG tablet Take 10 mg by mouth daily before breakfast.    [provider]  levonorgestrel (MIRENA) 20 MCG/24HR IUD by Intrauterine route.    [provider]  metFORMIN (GLUCOPHAGE) 1000 MG tablet Take 1,000 mg by mouth 2 (two) times daily with a meal.    [provider]  metroNIDAZOLE (FLAGYL) 500 MG tablet Take 1 tablet (500 mg total) by mouth 2 (two) times daily. One po bid x 7 days 06/18/19   Geoffery Lyonselo, Douglas, MD  nystatin (MYCOSTATIN/NYSTOP) powder Apply topically 4 (four) times daily. 06/18/19   Geoffery Lyonselo, Douglas, MD  oxyCODONE-acetaminophen (PERCOCET) 5-325 MG tablet Take 1-2 tablets by mouth every 4 (four) hours as needed. 05/20/17   Loren RacerYelverton, Seniyah Esker, MD  rosuvastatin (CRESTOR) 20 MG tablet Take 20 mg by mouth daily.    [provider]  Semaglutide (OZEMPIC, 1 MG/DOSE, ) Inject 1 mg/mL into the skin once a week.    [provider]  valACYclovir (VALTREX) 1000 MG tablet Take 1 tablet (1,000 mg total) by mouth 3 (three)  times daily. 06/18/19   Geoffery Lyons, MD    Family History Family History  Problem Relation Age of Onset  . Migraines Mother     Social History Social History   Tobacco Use  . Smoking status: Current Every Day Smoker    Packs/day: 0.50    Types: Cigarettes  . Smokeless tobacco: Never Used  Substance Use Topics  . Alcohol use: Yes    Alcohol/week: 4.0 standard drinks    Types: 4 Standard drinks or equivalent per week    Comment: drinks on weekends  . Drug use: No     Allergies   Bactrim [sulfamethoxazole-trimethoprim], Penicillins, and Reglan [metoclopramide]   Review of Systems Review of Systems  Gastrointestinal: Positive for abdominal pain and diarrhea.  All other systems reviewed and are negative.    Physical Exam Updated Vital Signs BP (!) 144/87 (BP Location: Right Arm)   Pulse 94   Temp 98.7 F (37.1 C) (Oral)   Resp 16   Ht 5\' 5"   (1.651 m)   Wt 109.2 kg   SpO2 100%   BMI 40.05 kg/m   Physical Exam Vitals signs and nursing note reviewed.    33 year old female, resting comfortably and in no acute distress. Vital signs are significant for elevated blood pressure. Oxygen saturation is 100%, which is normal. Head is normocephalic and atraumatic. PERRLA, EOMI. Oropharynx is clear. Neck is nontender and supple without adenopathy or JVD. Back is nontender and there is no CVA tenderness. Lungs are clear without rales, wheezes, or rhonchi. Chest is nontender.  Area of slight induration and mild purulent drainage in the medial aspect of the right breast. Heart has regular rate and rhythm without murmur. Abdomen is soft, flat, with mild tenderness in the left mid and lower abdomen.  There is no rebound or guarding.  There are no masses or hepatosplenomegaly and peristalsis is normoactive. Pelvic: Normal external female genitalia.  Cervix is closed.  Small amount of white vaginal discharge present.  Moderate cervical motion tenderness present.  Fundus normal size and position.  No adnexal masses or tenderness. Extremities have no cyanosis or edema, full range of motion is present. Skin is warm and dry without rash. Neurologic: Mental status is normal, cranial nerves are intact, there are no motor or sensory deficits.;  ED Treatments / Results  Labs (all labs ordered are listed, but only abnormal results are displayed) Labs Reviewed  WET PREP, GENITAL  COMPREHENSIVE METABOLIC PANEL  LIPASE, BLOOD  CBC WITH DIFFERENTIAL/PLATELET  RPR  HIV ANTIBODY (ROUTINE TESTING W REFLEX)  URINALYSIS, ROUTINE W REFLEX MICROSCOPIC  PREGNANCY, URINE  GC/CHLAMYDIA PROBE AMP ([place]) NOT AT [facility]   Procedures Procedures   Medications Ordered in ED Medications  cefTRIAXone (ROCEPHIN) injection 250 mg (has no administration in time range)  azithromycin (ZITHROMAX) tablet 1,000 mg (has no administration in time range)  doxycycline  (VIBRA-TABS) tablet 100 mg (has no administration in time range)     Initial Impression / Assessment and Plan / ED Course  I have reviewed the triage vital signs and the nursing notes.  Pertinent lab results that were available during my care of the patient were reviewed by me and considered in my medical decision making (see chart for details).  Abdominal pain with left lower quadrant tenderness.  Diagnostic possibilities include diverticulitis, urinary tract infection, urolithiasis, ovarian cyst, endometriosis, pelvic inflammatory disease.  Breast abscess which is not large enough to warrant incision and drainage.  Old records are reviewed, and  she does have several ED visits for bacterial vaginosis as well as for STI exposure.  Exam is certainly concerning for pelvic inflammatory disease.  Presence of IUD makes PID more likely.  She is given an injection of ceftriaxone and is given oral doxycycline and azithromycin.  She will be discharged with prescription for doxycycline.  This should also help treat the breast abscess.  CBC shows a slight leukocytosis but without a left shift.  Urinalysis is normal, pregnancy test negative, will wet prep negative for clue cells.  Metabolic panel and wet prep are pending.  Case is signed out to Dr. Maryan Rued.  Final Clinical Impressions(s) / ED Diagnoses   Final diagnoses:  Pelvic inflammatory disease  Abscess of right breast unrelated to pregnancy or breastfeeding    ED Discharge Orders         Ordered    doxycycline (VIBRAMYCIN) 100 MG capsule  2 times daily     08/05/19 9373           Delora Fuel, MD 42/87/68 [phone]

## 2019-08-05 NOTE — ED Notes (Addendum)
ED Provider at bedside, Dr. Maryan Rued.

## 2019-08-05 NOTE — ED Triage Notes (Addendum)
Patient c/o dull, lower abdominal pain X 2 days, accompanied by diarrhea.  She reports the diarrhea is common with her Metformin.  She states these sxs are similar to when she has had BV in the past and requests STD testing; denies any abnormal vaginal discharge or foul smelling odor.

## 2019-08-06 LAB — GC/CHLAMYDIA PROBE AMP (~~LOC~~) NOT AT ARMC
Chlamydia: NEGATIVE
Neisseria Gonorrhea: NEGATIVE

## 2019-08-06 LAB — HIV ANTIBODY (ROUTINE TESTING W REFLEX): HIV Screen 4th Generation wRfx: NONREACTIVE

## 2019-08-06 LAB — RPR: RPR Ser Ql: NONREACTIVE

## 2019-10-15 ENCOUNTER — Encounter (HOSPITAL_BASED_OUTPATIENT_CLINIC_OR_DEPARTMENT_OTHER): Payer: Self-pay | Admitting: *Deleted

## 2019-10-15 ENCOUNTER — Other Ambulatory Visit: Payer: Self-pay

## 2019-10-15 ENCOUNTER — Emergency Department (HOSPITAL_BASED_OUTPATIENT_CLINIC_OR_DEPARTMENT_OTHER)
Admission: EM | Admit: 2019-10-15 | Discharge: 2019-10-15 | Disposition: A | Discharge status: Home / Self Care | Payer: 59 | Attending: Emergency Medicine | Admitting: Emergency Medicine

## 2019-10-15 DIAGNOSIS — F17210 Nicotine dependence, cigarettes, uncomplicated: Secondary | ICD-10-CM | POA: Insufficient documentation

## 2019-10-15 DIAGNOSIS — J069 Acute upper respiratory infection, unspecified: Secondary | ICD-10-CM | POA: Diagnosis not present

## 2019-10-15 DIAGNOSIS — Z79899 Other long term (current) drug therapy: Secondary | ICD-10-CM | POA: Insufficient documentation

## 2019-10-15 DIAGNOSIS — R05 Cough: Secondary | ICD-10-CM | POA: Diagnosis present

## 2019-10-15 DIAGNOSIS — Z7984 Long term (current) use of oral hypoglycemic drugs: Secondary | ICD-10-CM | POA: Diagnosis not present

## 2019-10-15 DIAGNOSIS — Z20828 Contact with and (suspected) exposure to other viral communicable diseases: Secondary | ICD-10-CM | POA: Diagnosis not present

## 2019-10-15 LAB — SARS CORONAVIRUS 2 (TAT 6-24 HRS): SARS Coronavirus 2: NEGATIVE

## 2019-10-15 MED ORDER — BENZONATATE 100 MG PO CAPS: 100.0000 mg | ORAL_CAPSULE | Freq: Three times a day (TID) | ORAL | 0 refills | Status: DC

## 2019-10-15 NOTE — ED Triage Notes (Signed)
Pt c/o h/a, chills, cough and body aches x 2 days

## 2019-10-15 NOTE — ED Provider Notes (Signed)
[facility] EMERGENCY DEPARTMENT Provider Note   CSN: [id number] Arrival date & time: 10/15/19  1417     History   Chief Complaint Chief Complaint  Patient presents with   Cough    HPI Glenda Mann is a 33 y.o. female with no relevant past medical history presents to the ED with a 2-day history of headache, cough, rhinorrhea, body aches, and subjective fevers and chills.  Patient states that her daughter and her daughter's friend have both been sick recently.  She has been taking NyQuil, DayQuil, and cough drops for symptomatic relief, with some effect.  She has also been taking Tylenol for any fevers or chills.  She also endorses diminished appetite and nausea.  Patient denies any shortness of breath or difficulty breathing, chest pain, dizziness, abdominal pain, changes in bowel habits, or urinary symptoms.  Patient denies chance of pregnancy.     HPI  Past Medical History:  Diagnosis Date   BV (bacterial vaginosis)    Chlamydia    Gallstones    Gestational diabetes    Shingles     Patient Active Problem List   Diagnosis Date Noted   Hidradenitis axillaris 12/20/2011    Past Surgical History:  Procedure Laterality Date   CHOLECYSTECTOMY     INCISE AND DRAIN ABCESS       OB History    Gravida  2   Para  1   Term      Preterm      AB      Living        SAB      TAB      Ectopic      Multiple      Live Births               Home Medications    Prior to Admission medications   Medication Sig Start Date End Date Taking? Authorizing Provider  benzonatate (TESSALON) 100 MG capsule Take 1 capsule (100 mg total) by mouth every 8 (eight) hours. 10/15/19   Corena Herter, PA-C  glipiZIDE (GLUCOTROL) 10 MG tablet Take 10 mg by mouth daily before breakfast.    [provider]  levonorgestrel (MIRENA) 20 MCG/24HR IUD by Intrauterine route.    [provider]  metFORMIN (GLUCOPHAGE) 1000 MG tablet Take 1,000 mg  by mouth 2 (two) times daily with a meal.    [provider]  nystatin (MYCOSTATIN/NYSTOP) powder Apply topically 4 (four) times daily. 06/18/19   Veryl Speak, MD  rosuvastatin (CRESTOR) 20 MG tablet Take 20 mg by mouth daily.    [provider]  Semaglutide (OZEMPIC, 1 MG/DOSE, [place]) Inject 1 mg/mL into the skin once a week.    [provider]    Family History Family History  Problem Relation Age of Onset   Migraines Mother     Social History Social History   Tobacco Use   Smoking status: Current Every Day Smoker    Packs/day: 0.50    Types: Cigarettes   Smokeless tobacco: Never Used  Substance Use Topics   Alcohol use: Yes    Alcohol/week: 4.0 standard drinks    Types: 4 Standard drinks or equivalent per week    Comment: drinks on weekends   Drug use: No     Allergies   Bactrim [sulfamethoxazole-trimethoprim], Penicillins, and Reglan [metoclopramide]   Review of Systems Review of Systems  All other systems reviewed and are negative.    Physical Exam Updated Vital Signs  BP (!) 149/99    Pulse (!) 101    Temp 99 F (37.2 C) (Oral)    Resp 16    Ht 5\' 5"  (1.651 m)    Wt 107.5 kg    LMP 10/08/2019    SpO2 99%    BMI 39.44 kg/m   Physical Exam Vitals signs and nursing note reviewed. Exam conducted with a chaperone present.  Constitutional:      Appearance: Normal appearance.  HENT:     Head: Normocephalic and atraumatic.     Nose: Nose normal.     Mouth/Throat:     Pharynx: Oropharynx is clear.     Comments: No tonsillar hypertrophy or uvular deviation.  No exudates.  Only mildly erythematous.  No masses appreciated. Eyes:     General: No scleral icterus.    Conjunctiva/sclera: Conjunctivae normal.  Neck:     Musculoskeletal: Normal range of motion and neck supple. No neck rigidity.  Cardiovascular:     Rate and Rhythm: Normal rate and regular rhythm.     Pulses: Normal pulses.     Heart sounds: Normal heart sounds.    Pulmonary:     Effort: Pulmonary effort is normal. No respiratory distress.     Breath sounds: Normal breath sounds. No wheezing or rales.  Abdominal:     General: Abdomen is flat. There is no distension.     Palpations: Abdomen is soft.     Tenderness: There is no abdominal tenderness. There is no guarding.  Skin:    General: Skin is dry.  Neurological:     Mental Status: She is alert.     GCS: GCS eye subscore is 4. GCS verbal subscore is 5. GCS motor subscore is 6.  Psychiatric:        Mood and Affect: Mood normal.        Behavior: Behavior normal.        Thought Content: Thought content normal.      ED Treatments / Results  Labs (all labs ordered are listed, but only abnormal results are displayed) Labs Reviewed  SARS CORONAVIRUS 2 (TAT 6-24 HRS)  INFLUENZA PANEL BY PCR (TYPE A & B)    EKG None  Radiology No results found.  Procedures Procedures (including critical care time)  Medications Ordered in ED Medications - No data to display   Initial Impression / Assessment and Plan / ED Course  I have reviewed the triage vital signs and the nursing notes.  Pertinent labs & imaging results that were available during my care of the patient were reviewed by me and considered in my medical decision making (see chart for details).       Patient's history and physical exam is consistent with an upper respiratory infection, likely viral.  Will obtain COVID-19 and influenza testing.  Recommend that she continue conservative treatments for symptomatic relief including DayQuil and NyQuil as well as cough drops.  Also encouraged warm tea with honey.  Will prescribe her Jerilynn Somessalon Perles for her persistent cough that has kept her up at night.  Do not feel as though chest imaging is warranted at this time due to brevity of her illness and her normal vital signs.  Low suspicion for pneumonia.  No neurologic symptoms or other history that make the headache concerning.  Entire picture  is suggestive of viral upper respiratory infection.  Recommend that she obtain a thermometer so that she may track her fevers at home.  Continue Tylenol or ibuprofen as needed  for fever control. Return to the ED or seek medical attention should she develop any fevers or chills uncontrolled with Tylenol or ibuprofen, uncontrolled nausea vomiting, difficulty breathing or shortness of breath, or any other new or worsening symptoms.  Glenda Mann was evaluated in Emergency Department on 10/15/2019 for the symptoms described in the history of present illness. She was evaluated in the context of the global COVID-19 pandemic, which necessitated consideration that the patient might be at risk for infection with the SARS-CoV-2 virus that causes COVID-19. Institutional protocols and algorithms that pertain to the evaluation of patients at risk for COVID-19 are in a state of rapid change based on information released by regulatory bodies including the CDC and federal and state organizations. These policies and algorithms were followed during the patient's care in the ED.   Final Clinical Impressions(s) / ED Diagnoses   Final diagnoses:  Viral URI with cough    ED Discharge Orders         Ordered    benzonatate (TESSALON) 100 MG capsule  Every 8 hours     10/15/19 1501           Lorelee New, PA-C 10/15/19 1541    Sabas Sous, MD 10/16/19 [phone]

## 2019-10-15 NOTE — Discharge Instructions (Addendum)
Please take your cough medication, as prescribed.    Return to the ED or seek medical attention should you develop any fevers or chills uncontrolled with Tylenol or ibuprofen, uncontrolled nausea vomiting, difficulty breathing or shortness of breath, or any other new or worsening symptoms.

## 2020-02-24 ENCOUNTER — Other Ambulatory Visit: Payer: Self-pay

## 2020-02-24 ENCOUNTER — Emergency Department (HOSPITAL_BASED_OUTPATIENT_CLINIC_OR_DEPARTMENT_OTHER)
Admission: EM | Admit: 2020-02-24 | Discharge: 2020-02-24 | Disposition: A | Discharge status: Home / Self Care | Payer: 59 | Attending: Emergency Medicine | Admitting: Emergency Medicine

## 2020-02-24 ENCOUNTER — Encounter (HOSPITAL_BASED_OUTPATIENT_CLINIC_OR_DEPARTMENT_OTHER): Payer: Self-pay | Admitting: Emergency Medicine

## 2020-02-24 DIAGNOSIS — Z79899 Other long term (current) drug therapy: Secondary | ICD-10-CM | POA: Insufficient documentation

## 2020-02-24 DIAGNOSIS — A5901 Trichomonal vulvovaginitis: Secondary | ICD-10-CM | POA: Insufficient documentation

## 2020-02-24 DIAGNOSIS — B373 Candidiasis of vulva and vagina: Secondary | ICD-10-CM | POA: Diagnosis not present

## 2020-02-24 DIAGNOSIS — B3731 Acute candidiasis of vulva and vagina: Secondary | ICD-10-CM

## 2020-02-24 DIAGNOSIS — F17210 Nicotine dependence, cigarettes, uncomplicated: Secondary | ICD-10-CM | POA: Diagnosis not present

## 2020-02-24 DIAGNOSIS — E119 Type 2 diabetes mellitus without complications: Secondary | ICD-10-CM | POA: Diagnosis not present

## 2020-02-24 DIAGNOSIS — Z7984 Long term (current) use of oral hypoglycemic drugs: Secondary | ICD-10-CM | POA: Insufficient documentation

## 2020-02-24 DIAGNOSIS — R1030 Lower abdominal pain, unspecified: Secondary | ICD-10-CM | POA: Diagnosis present

## 2020-02-24 DIAGNOSIS — N390 Urinary tract infection, site not specified: Secondary | ICD-10-CM | POA: Insufficient documentation

## 2020-02-24 LAB — URINALYSIS, MICROSCOPIC (REFLEX)

## 2020-02-24 LAB — URINALYSIS, ROUTINE W REFLEX MICROSCOPIC
Bilirubin Urine: NEGATIVE
Glucose, UA: NEGATIVE mg/dL
Hgb urine dipstick: NEGATIVE
Ketones, ur: NEGATIVE mg/dL
Nitrite: NEGATIVE
Protein, ur: NEGATIVE mg/dL
Specific Gravity, Urine: >1.03 — ABNORMAL HIGH (ref 1.005–1.030)
pH: 6 (ref 5.0–8.0)

## 2020-02-24 LAB — WET PREP, GENITAL
Sperm: NONE SEEN
Yeast Wet Prep HPF POC: NONE SEEN

## 2020-02-24 LAB — CBG MONITORING, ED: Glucose-Capillary: 88 mg/dL (ref 70–99)

## 2020-02-24 LAB — PREGNANCY, URINE: Preg Test, Ur: NEGATIVE

## 2020-02-24 MED ORDER — FLUCONAZOLE 150 MG PO TABS: ORAL_TABLET | ORAL | 0 refills | Status: DC

## 2020-02-24 MED ORDER — FLUCONAZOLE 150 MG PO TABS
150.0000 mg | ORAL_TABLET | Freq: Once | ORAL | Status: AC
  Administered 2020-02-24: 07:00:00 150 mg via ORAL
  Filled 2020-02-24: qty 1

## 2020-02-24 MED ORDER — METRONIDAZOLE 500 MG PO TABS
2000.0000 mg | ORAL_TABLET | Freq: Once | ORAL | Status: AC
  Administered 2020-02-24: 2000 mg via ORAL
  Filled 2020-02-24: qty 4

## 2020-02-24 MED ORDER — FOSFOMYCIN TROMETHAMINE 3 G PO PACK
3.0000 g | PACK | Freq: Once | ORAL | Status: AC
  Administered 2020-02-24: 07:00:00 3 g via ORAL
  Filled 2020-02-24: qty 3

## 2020-02-24 NOTE — ED Triage Notes (Signed)
Pt states she is a diabetic and has not felt right  Pt is c/o some dizziness   Pt states she has been having lower abd pain and is having a discharge, white in color with an odor  Denies any urinary symptoms

## 2020-02-24 NOTE — ED Provider Notes (Signed)
[facility] Provider Note: [name], MD, FACEP  CSN: [id number] MRN: [id number] ARRIVAL: 02/24/20 at 0510 ROOM: MH06/MH06   CHIEF COMPLAINT  Abdominal Pain   HISTORY OF PRESENT ILLNESS  02/24/20 5:43 AM Glenda Mann is a 34 y.o. female with a history of diabetes.  She is here with suprapubic pain for the past 3 days which she rates as a 7 out of 10.  She describes it as dull.  It is worse with movement or palpation.  She has had an associated discharge which was white in color but has become malodorous and more curd-like.  She became concerned and came to the ED to be checked this morning.  She denies any dysuria or urinary frequency.  She has had some lightheadedness.  Her sugar on arrival was 61 which she states is well controlled for her.   Past Medical History:  Diagnosis Date  . BV (bacterial vaginosis)   . Chlamydia   . Gallstones   . Gestational diabetes   . Shingles     Past Surgical History:  Procedure Laterality Date  . CHOLECYSTECTOMY    . INCISE AND DRAIN ABCESS      Family History  Problem Relation Age of Onset  . Migraines Mother   . Hypertension Maternal Grandmother   . Breast cancer Paternal Grandmother     Social History   Tobacco Use  . Smoking status: Current Every Day Smoker    Packs/day: 0.50    Types: Cigarettes  . Smokeless tobacco: Never Used  Substance Use Topics  . Alcohol use: Yes    Alcohol/week: 4.0 standard drinks    Types: 4 Standard drinks or equivalent per week    Comment: drinks on weekends  . Drug use: No    Prior to Admission medications   Medication Sig Start Date End Date Taking? Authorizing Provider  benzonatate (TESSALON) 100 MG capsule Take 1 capsule (100 mg total) by mouth every 8 (eight) hours. 10/15/19   Lorelee New, PA-C  glipiZIDE (GLUCOTROL) 10 MG tablet Take 10 mg by mouth daily before breakfast.    [provider]  levonorgestrel (MIRENA) 20 MCG/24HR IUD by Intrauterine route.     [provider]  metFORMIN (GLUCOPHAGE) 1000 MG tablet Take 1,000 mg by mouth 2 (two) times daily with a meal.    [provider]  nystatin (MYCOSTATIN/NYSTOP) powder Apply topically 4 (four) times daily. 06/18/19   Geoffery Lyons, MD  rosuvastatin (CRESTOR) 20 MG tablet Take 20 mg by mouth daily.    [provider]  Semaglutide (OZEMPIC, 1 MG/DOSE, [place]) Inject 1 mg/mL into the skin once a week.    [provider]    Allergies Bactrim [sulfamethoxazole-trimethoprim], Penicillins, and Reglan [metoclopramide]   REVIEW OF SYSTEMS  Negative except as noted here or in the History of Present Illness.   PHYSICAL EXAMINATION  Initial Vital Signs Blood pressure (!) 161/113, pulse 95, temperature 98.1 F (36.7 C), temperature source Oral, resp. rate 16, height 5\' 5"  (1.651 m), weight 104.2 kg, SpO2 100 %.  Examination General: Well-developed, well-nourished female in no acute distress; appearance consistent with age of record HENT: normocephalic; atraumatic Eyes: pupils equal, round and reactive to light; extraocular muscles intact Neck: supple Heart: regular rate and rhythm Lungs: clear to auscultation bilaterally Abdomen: soft; nondistended; mild suprapubic tenderness; bowel sounds present GU: vulvovaginal erythema without tenderness; slightly greenish, curd-like vaginal discharge; no vaginal bleeding; no cervical motion tenderness; no adnexal tenderness Extremities: No deformity; full  range of motion; pulses normal Neurologic: Awake, alert and oriented; motor function intact in all extremities and symmetric; no facial droop Skin: Warm and dry Psychiatric: Normal mood and affect   RESULTS  Summary of this visit's results, reviewed and interpreted by myself:   EKG Interpretation  Date/Time:    Ventricular Rate:    PR Interval:    QRS Duration:   QT Interval:    QTC Calculation:   R Axis:     Text Interpretation:        Laboratory  Studies: Results for orders placed or performed during the hospital encounter of 02/24/20 (from the past 24 hour(s))  CBG monitoring, ED     Status: None   Collection Time: 02/24/20  5:29 AM  Result Value Ref Range   Glucose-Capillary 88 70 - 99 mg/dL  Urinalysis, Routine w reflex microscopic     Status: Abnormal   Collection Time: 02/24/20  6:01 AM  Result Value Ref Range   Color, Urine YELLOW YELLOW   APPearance CLOUDY (A) CLEAR   Specific Gravity, Urine >1.030 (H) 1.005 - 1.030   pH 6.0 5.0 - 8.0   Glucose, UA NEGATIVE NEGATIVE mg/dL   Hgb urine dipstick NEGATIVE NEGATIVE   Bilirubin Urine NEGATIVE NEGATIVE   Ketones, ur NEGATIVE NEGATIVE mg/dL   Protein, ur NEGATIVE NEGATIVE mg/dL   Nitrite NEGATIVE NEGATIVE   Leukocytes,Ua MODERATE (A) NEGATIVE  Pregnancy, urine     Status: None   Collection Time: 02/24/20  6:01 AM  Result Value Ref Range   Preg Test, Ur NEGATIVE NEGATIVE  Urinalysis, Microscopic (reflex)     Status: Abnormal   Collection Time: 02/24/20  6:01 AM  Result Value Ref Range   RBC / HPF 0-5 0 - 5 RBC/hpf   WBC, UA 6-10 0 - 5 WBC/hpf   Bacteria, UA FEW (A) NONE SEEN   Squamous Epithelial / LPF 6-10 0 - 5   Mucus PRESENT    Hyaline Casts, UA PRESENT   Wet prep, genital     Status: Abnormal   Collection Time: 02/24/20  6:27 AM   Specimen: Cervix  Result Value Ref Range   Yeast Wet Prep HPF POC NONE SEEN NONE SEEN   Trich, Wet Prep PRESENT (A) NONE SEEN   Clue Cells Wet Prep HPF POC PRESENT (A) NONE SEEN   WBC, Wet Prep HPF POC MANY (A) NONE SEEN   Sperm NONE SEEN    Imaging Studies: No results found.  ED COURSE and MDM  Nursing notes, initial and subsequent vitals signs, including pulse oximetry, reviewed and interpreted by myself.  Vitals:   02/24/20 0520 02/24/20 0525  BP:  (!) 161/113  Pulse:  95  Resp:  16  Temp:  98.1 F (36.7 C)  TempSrc:  Oral  SpO2:  100%  Weight: 104.2 kg   Height: 5\' 5"  (1.651 m)    Medications  fluconazole  (DIFLUCAN) tablet 150 mg (has no administration in time range)  metroNIDAZOLE (FLAGYL) tablet 2,000 mg (has no administration in time range)  fosfomycin (MONUROL) packet 3 g (has no administration in time range)    We will treat the patient for trichomoniasis as well as yeast.  No yeast was seen on wet prep but the curd-like nature of her discharge, along with a history of frequent yeast infections given her diabetes, suggest she has this as well.  We will also treat for a possible nascent urinary tract infection.  She was advised that gonorrhea and Chlamydia  testing are pending and the CDC discourages prophylactic treatment without objective evidence of infection.  PROCEDURES  Procedures   ED DIAGNOSES     ICD-10-CM   1. Trichomoniasis of vagina  A59.01   2. Vaginal candidiasis  B37.3   3. Lower urinary tract infectious disease  N39.0        Glenda Sligar, MD 02/24/20 [phone]

## 2020-02-25 LAB — GC/CHLAMYDIA PROBE AMP (~~LOC~~) NOT AT ARMC
Chlamydia: NEGATIVE
Neisseria Gonorrhea: NEGATIVE

## 2020-02-25 LAB — URINE CULTURE

## 2020-03-24 ENCOUNTER — Other Ambulatory Visit: Payer: Self-pay

## 2020-03-24 ENCOUNTER — Encounter (HOSPITAL_BASED_OUTPATIENT_CLINIC_OR_DEPARTMENT_OTHER): Payer: Self-pay | Admitting: Emergency Medicine

## 2020-03-24 ENCOUNTER — Emergency Department (HOSPITAL_BASED_OUTPATIENT_CLINIC_OR_DEPARTMENT_OTHER)
Admission: EM | Admit: 2020-03-24 | Discharge: 2020-03-24 | Disposition: A | Discharge status: Home / Self Care | Payer: 59 | Attending: Emergency Medicine | Admitting: Emergency Medicine

## 2020-03-24 DIAGNOSIS — F17210 Nicotine dependence, cigarettes, uncomplicated: Secondary | ICD-10-CM | POA: Insufficient documentation

## 2020-03-24 DIAGNOSIS — B3731 Acute candidiasis of vulva and vagina: Secondary | ICD-10-CM

## 2020-03-24 DIAGNOSIS — R110 Nausea: Secondary | ICD-10-CM | POA: Diagnosis not present

## 2020-03-24 DIAGNOSIS — N898 Other specified noninflammatory disorders of vagina: Secondary | ICD-10-CM | POA: Diagnosis present

## 2020-03-24 DIAGNOSIS — L732 Hidradenitis suppurativa: Secondary | ICD-10-CM | POA: Diagnosis not present

## 2020-03-24 DIAGNOSIS — B373 Candidiasis of vulva and vagina: Secondary | ICD-10-CM | POA: Diagnosis not present

## 2020-03-24 HISTORY — DX: Hidradenitis suppurativa: L73.2

## 2020-03-24 LAB — URINALYSIS, MICROSCOPIC (REFLEX)

## 2020-03-24 LAB — URINALYSIS, ROUTINE W REFLEX MICROSCOPIC
Bilirubin Urine: NEGATIVE
Glucose, UA: NEGATIVE mg/dL
Hgb urine dipstick: NEGATIVE
Ketones, ur: NEGATIVE mg/dL
Nitrite: NEGATIVE
Protein, ur: NEGATIVE mg/dL
Specific Gravity, Urine: >1.03 — ABNORMAL HIGH (ref 1.005–1.030)
pH: 6 (ref 5.0–8.0)

## 2020-03-24 LAB — WET PREP, GENITAL
Sperm: NONE SEEN
Trich, Wet Prep: NONE SEEN

## 2020-03-24 LAB — PREGNANCY, URINE: Preg Test, Ur: NEGATIVE

## 2020-03-24 MED ORDER — DOXYCYCLINE HYCLATE 100 MG PO CAPS: 100.0000 mg | ORAL_CAPSULE | Freq: Two times a day (BID) | ORAL | 0 refills | Status: DC

## 2020-03-24 MED ORDER — FLUCONAZOLE 150 MG PO TABS: ORAL_TABLET | ORAL | 0 refills | Status: DC

## 2020-03-24 MED ORDER — ONDANSETRON 8 MG PO TBDP: 8.0000 mg | ORAL_TABLET | Freq: Three times a day (TID) | ORAL | 0 refills | Status: DC | PRN

## 2020-03-24 MED ORDER — ONDANSETRON 4 MG PO TBDP
8.0000 mg | ORAL_TABLET | Freq: Once | ORAL | Status: AC
  Administered 2020-03-24: 8 mg via ORAL
  Filled 2020-03-24: qty 2

## 2020-03-24 MED ORDER — DOXYCYCLINE HYCLATE 100 MG PO TABS
100.0000 mg | ORAL_TABLET | Freq: Once | ORAL | Status: AC
  Administered 2020-03-24: 07:00:00 100 mg via ORAL
  Filled 2020-03-24: qty 1

## 2020-03-24 MED ORDER — FLUCONAZOLE 150 MG PO TABS
150.0000 mg | ORAL_TABLET | Freq: Once | ORAL | Status: AC
  Administered 2020-03-24: 150 mg via ORAL
  Filled 2020-03-24: qty 1

## 2020-03-24 NOTE — ED Triage Notes (Signed)
Pt c/o vaginal discharge with nausea. Pt also states she has several abscesses in groin area, under breast and axilla.

## 2020-03-24 NOTE — ED Provider Notes (Signed)
[facility] Provider Note: [name], MD, FACEP  CSN: [id number] MRN: [id number] ARRIVAL: 03/24/20 at 0547 ROOM: MH10/MH10   CHIEF COMPLAINT  Vaginal Discharge and Abscess   HISTORY OF PRESENT ILLNESS  03/24/20 6:04 AM Glenda Mann is a 34 y.o. female who has had nausea without vomiting for the past 5 days.  Yesterday she developed vaginal discharge.  She describes the discharge as white and clumpy.  She has not noticed an abnormal odor.  She also has a history of hidradenitis suppurativa and has had an increased number of "abscesses" in her right axilla, breasts and pubic area.  She attributes the pubic area involvement to recent shaving.  She rates associated pain is an 8 out of 10, worse with movement or palpation.  Past Medical History:  Diagnosis Date  . BV (bacterial vaginosis)   . Chlamydia   . Gallstones   . Gestational diabetes   . Hidradenitis suppurativa   . Shingles     Past Surgical History:  Procedure Laterality Date  . CHOLECYSTECTOMY    . INCISE AND DRAIN ABCESS      Family History  Problem Relation Age of Onset  . Migraines Mother   . Hypertension Maternal Grandmother   . Breast cancer Paternal Grandmother     Social History   Tobacco Use  . Smoking status: Current Every Day Smoker    Packs/day: 0.50    Types: Cigarettes  . Smokeless tobacco: Never Used  Substance Use Topics  . Alcohol use: Yes    Alcohol/week: 4.0 standard drinks    Types: 4 Standard drinks or equivalent per week    Comment: drinks on weekends  . Drug use: No    Prior to Admission medications   Medication Sig Start Date End Date Taking? Authorizing Provider  doxycycline (VIBRAMYCIN) 100 MG capsule Take 1 capsule (100 mg total) by mouth 2 (two) times daily. One po bid x 7 days 03/24/20   Andrell Bergeson, MD  fluconazole (DIFLUCAN) 150 MG tablet Take 1 tablet on 03/27/2020 if yeast infection symptoms persist. 03/24/20   Ellsworth Waldschmidt, MD  glipiZIDE (GLUCOTROL) 10  MG tablet Take 10 mg by mouth daily before breakfast.    [provider]  levonorgestrel (MIRENA) 20 MCG/24HR IUD by Intrauterine route.    [provider]  metFORMIN (GLUCOPHAGE) 1000 MG tablet Take 1,000 mg by mouth 2 (two) times daily with a meal.    [provider]  ondansetron (ZOFRAN ODT) 8 MG disintegrating tablet Take 1 tablet (8 mg total) by mouth every 8 (eight) hours as needed for nausea or vomiting. 03/24/20   Rondo Spittler, MD  rosuvastatin (CRESTOR) 20 MG tablet Take 20 mg by mouth daily.    [provider]  Semaglutide (OZEMPIC, 1 MG/DOSE, [place]) Inject 1 mg/mL into the skin once a week.    [provider]    Allergies Bactrim [sulfamethoxazole-trimethoprim], Penicillins, and Reglan [metoclopramide]   REVIEW OF SYSTEMS  Negative except as noted here or in the History of Present Illness.   PHYSICAL EXAMINATION  Initial Vital Signs Blood pressure (!) 152/105, pulse (!) 101, temperature 98.8 F (37.1 C), temperature source Oral, resp. rate 18, height 5' (1.524 m), weight 103 kg, SpO2 99 %.  Examination General: Well-developed, well-nourished female in no acute distress; appearance consistent with age of record HENT: normocephalic; atraumatic Eyes: pupils equal, round and reactive to light; extraocular muscles intact Neck: supple Heart: regular rate and rhythm Lungs: clear to auscultation bilaterally Abdomen:  soft; nondistended; nontender; bowel sounds present  GU: Vulvovaginal inflammation with white, curd-like discharge; no vaginal bleeding; no cervical motion tenderness; no adnexal tenderness Extremities: No deformity; full range of motion; pulses normal Neurologic: Awake, alert and oriented; motor function intact in all extremities and symmetric; no facial droop Skin: Warm and dry; nonfluctuant tender lesions of right axilla and pubic area Psychiatric: Normal mood and affect   RESULTS  Summary of this visit's results,  reviewed and interpreted by myself:   EKG Interpretation  Date/Time:    Ventricular Rate:    PR Interval:    QRS Duration:   QT Interval:    QTC Calculation:   R Axis:     Text Interpretation:        Laboratory Studies: Results for orders placed or performed during the hospital encounter of 03/24/20 (from the past 24 hour(s))  Urinalysis, Routine w reflex microscopic     Status: Abnormal   Collection Time: 03/24/20  5:50 AM  Result Value Ref Range   Color, Urine YELLOW YELLOW   APPearance HAZY (A) CLEAR   Specific Gravity, Urine >1.030 (H) 1.005 - 1.030   pH 6.0 5.0 - 8.0   Glucose, UA NEGATIVE NEGATIVE mg/dL   Hgb urine dipstick NEGATIVE NEGATIVE   Bilirubin Urine NEGATIVE NEGATIVE   Ketones, ur NEGATIVE NEGATIVE mg/dL   Protein, ur NEGATIVE NEGATIVE mg/dL   Nitrite NEGATIVE NEGATIVE   Leukocytes,Ua MODERATE (A) NEGATIVE  Pregnancy, urine     Status: None   Collection Time: 03/24/20  5:50 AM  Result Value Ref Range   Preg Test, Ur NEGATIVE NEGATIVE  Urinalysis, Microscopic (reflex)     Status: Abnormal   Collection Time: 03/24/20  5:50 AM  Result Value Ref Range   RBC / HPF 0-5 0 - 5 RBC/hpf   WBC, UA 0-5 0 - 5 WBC/hpf   Bacteria, UA FEW (A) NONE SEEN   Squamous Epithelial / LPF 6-10 0 - 5   Mucus PRESENT    Hyphae Yeast PRESENT   Wet prep, genital     Status: Abnormal   Collection Time: 03/24/20  6:18 AM  Result Value Ref Range   Yeast Wet Prep HPF POC PRESENT (A) NONE SEEN   Trich, Wet Prep NONE SEEN NONE SEEN   Clue Cells Wet Prep HPF POC PRESENT (A) NONE SEEN   WBC, Wet Prep HPF POC MANY (A) NONE SEEN   Sperm NONE SEEN    Imaging Studies: No results found.  ED COURSE and MDM  Nursing notes, initial and subsequent vitals signs, including pulse oximetry, reviewed and interpreted by myself.  Vitals:   03/24/20 0558 03/24/20 0600  BP:  (!) 152/105  Pulse:  (!) 101  Resp:  18  Temp:  98.8 F (37.1 C)  TempSrc:  Oral  SpO2:  99%  Weight: 103 kg     Height: 5' (1.524 m)    Medications  ondansetron (ZOFRAN-ODT) disintegrating tablet 8 mg (has no administration in time range)  fluconazole (DIFLUCAN) tablet 150 mg (has no administration in time range)  doxycycline (VIBRA-TABS) tablet 100 mg (has no administration in time range)   We will treat for vulvovaginal candidiasis.  There is no abnormal odor to suggest bacterial vaginosis despite the presence of clue cells on wet prep.  None of the tender, indurated regions palpated in her axilla or pubic area appear to be ready for I&D.  Given their number and the difficulty of surgically treating hidradenitis suppurativa I feel this best  we will treat her with a course of doxycycline.   PROCEDURES  Procedures   ED DIAGNOSES     ICD-10-CM   1. Candidal vulvovaginitis  B37.3   2. Hidradenitis suppurativa  L73.2   3. Nausea  R11.0        Izabellah Dadisman, Jenny Reichmann, MD 03/24/20 [phone]

## 2020-03-25 LAB — GC/CHLAMYDIA PROBE AMP (~~LOC~~) NOT AT ARMC
Chlamydia: NEGATIVE
Comment: NEGATIVE
Comment: NORMAL
Neisseria Gonorrhea: NEGATIVE

## 2020-04-07 ENCOUNTER — Other Ambulatory Visit: Payer: Self-pay

## 2020-04-07 ENCOUNTER — Emergency Department (HOSPITAL_BASED_OUTPATIENT_CLINIC_OR_DEPARTMENT_OTHER)
Admission: EM | Admit: 2020-04-07 | Discharge: 2020-04-07 | Disposition: A | Discharge status: Home / Self Care | Payer: 59 | Attending: Emergency Medicine | Admitting: Emergency Medicine

## 2020-04-07 ENCOUNTER — Encounter (HOSPITAL_BASED_OUTPATIENT_CLINIC_OR_DEPARTMENT_OTHER): Payer: Self-pay

## 2020-04-07 DIAGNOSIS — Z882 Allergy status to sulfonamides status: Secondary | ICD-10-CM | POA: Diagnosis not present

## 2020-04-07 DIAGNOSIS — Z880 Allergy status to penicillin: Secondary | ICD-10-CM | POA: Diagnosis not present

## 2020-04-07 DIAGNOSIS — Z793 Long term (current) use of hormonal contraceptives: Secondary | ICD-10-CM | POA: Diagnosis not present

## 2020-04-07 DIAGNOSIS — F17210 Nicotine dependence, cigarettes, uncomplicated: Secondary | ICD-10-CM | POA: Diagnosis not present

## 2020-04-07 DIAGNOSIS — Z888 Allergy status to other drugs, medicaments and biological substances status: Secondary | ICD-10-CM | POA: Insufficient documentation

## 2020-04-07 DIAGNOSIS — M25511 Pain in right shoulder: Secondary | ICD-10-CM | POA: Diagnosis present

## 2020-04-07 MED ORDER — METHOCARBAMOL 500 MG PO TABS: 500.0000 mg | ORAL_TABLET | Freq: Two times a day (BID) | ORAL | 0 refills | Status: DC

## 2020-04-07 NOTE — ED Provider Notes (Signed)
[place] EMERGENCY DEPARTMENT Provider Note   CSN: [id number] Arrival date & time: 04/07/20  1119     History Chief Complaint  Patient presents with  . Arm Pain    Glenda Mann is a 34 y.o. female who presents with R shoulder pain.  Patient states that she noticed the pain at work last night.  She states she thought it was just cramping but the pain is gradually worsened.  She took 2 Tylenol and it did not provide any relief so she decided to come to the ED.  She states that she works at [employer] but has not been doing any heavy lifting today.  She is right-hand dominant.  She denies any neck pain, elbow pain, wrist pain.  She denies numbness or tingling in the arm.  Pain is worse with lifting her arm or any movement.  It is better with rest.  She has never had this before.  She had her Covid vaccine in the right arm about a month ago and had some soreness afterwards but this went away after a day.  She denies any trauma to the arm.  HPI     Past Medical History:  Diagnosis Date  . BV (bacterial vaginosis)   . Chlamydia   . Gallstones   . Gestational diabetes   . Hidradenitis suppurativa   . Shingles     Patient Active Problem List   Diagnosis Date Noted  . 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 CHOLECYSTECTOMY    . INCISE AND DRAIN ABCESS       OB History    Gravida  2   Para  1   Term      Preterm      AB      Living        SAB      TAB      Ectopic      Multiple      Live Births              Family History  Problem Relation Age of Onset  . Migraines Mother   . Hypertension Maternal Grandmother   . Breast cancer Paternal Grandmother     Social History   Tobacco Use  . Smoking status: Current Every Day Smoker    Packs/day: 0.50    Types: Cigarettes  . Smokeless tobacco: Never Used  Substance Use Topics  . Alcohol use: Yes    Comment: drinks on weekends  . Drug use:  No    Home Medications Prior to Admission medications   Medication Sig Start Date End Date Taking? Authorizing Provider  doxycycline (VIBRAMYCIN) 100 MG capsule Take 1 capsule (100 mg total) by mouth 2 (two) times daily. One po bid x 7 days 03/24/20   Molpus, John, MD  fluconazole (DIFLUCAN) 150 MG tablet Take 1 tablet on 03/27/2020 if yeast infection symptoms persist. 03/24/20   Molpus, John, MD  glipiZIDE (GLUCOTROL) 10 MG tablet Take 10 mg by mouth daily before breakfast.    [provider]  levonorgestrel (MIRENA) 20 MCG/24HR IUD by Intrauterine route.    [provider]  metFORMIN (GLUCOPHAGE) 1000 MG tablet Take 1,000 mg by mouth 2 (two) times daily with a meal.    [provider]  ondansetron (ZOFRAN ODT) 8 MG disintegrating tablet Take 1 tablet (8 mg total) by mouth every 8 (eight) hours as needed for nausea or vomiting. 03/24/20  Molpus, John, MD  rosuvastatin (CRESTOR) 20 MG tablet Take 20 mg by mouth daily.    [provider]  Semaglutide (OZEMPIC, 1 MG/DOSE, [place]) Inject 1 mg/mL into the skin once a week.    [provider]    Allergies    Bactrim [sulfamethoxazole-trimethoprim], Penicillins, and Reglan [metoclopramide]  Review of Systems   Review of Systems  Musculoskeletal: Positive for arthralgias and myalgias.  Skin: Negative for wound.  Neurological: Negative for weakness and numbness.    Physical Exam Updated Vital Signs BP (!) 152/107 (BP Location: Left Arm)   Pulse 91   Temp 98.8 F (37.1 C) (Oral)   Resp 18   Ht 5' (1.524 m)   Wt 104.1 kg   SpO2 100%   BMI 44.82 kg/m   Physical Exam Vitals and nursing note reviewed.  Constitutional:      General: She is not in acute distress.    Appearance: Normal appearance. She is well-developed. She is not ill-appearing.  HENT:     Head: Normocephalic and atraumatic.  Eyes:     General: No scleral icterus.       Right eye: No discharge.        Left eye: No discharge.      Conjunctiva/sclera: Conjunctivae normal.     Pupils: Pupils are equal, round, and reactive to light.  Cardiovascular:     Rate and Rhythm: Normal rate.  Pulmonary:     Effort: Pulmonary effort is normal. No respiratory distress.  Abdominal:     General: There is no distension.  Musculoskeletal:     Cervical back: Normal range of motion.     Comments: Right shoulder: No obvious swelling, deformity, or warmth. Tenderness to palpation over the biceps tendon and rotator cuff tendon. No neck or elbow tenderness. ROM deferred due to pain. 5/5 grip strength. Cap refill <2. N/V intact.   Skin:    General: Skin is warm and dry.  Neurological:     Mental Status: She is alert and oriented to person, place, and time.  Psychiatric:        Behavior: Behavior normal.     ED Results / Procedures / Treatments   Labs (all labs ordered are listed, but only abnormal results are displayed) Labs Reviewed - No data to display  EKG None  Radiology No results found.  Procedures Procedures (including critical care time)  Medications Ordered in ED Medications - No data to display  ED Course  I have reviewed the triage vital signs and the nursing notes.  Pertinent labs & imaging results that were available during my care of the patient were reviewed by me and considered in my medical decision making (see chart for details).  34 year old female presents with atraumatic right shoulder pain that is worse with movement that occurred at work last night.  Although she denies heavy lifting she works at [employer] and does do heavy lifting at times.  She likely has some tendinitis.  Advised rest, ice, NSAIDs, topical medications and a muscle relaxer.  She was given a sling for rest.  Advised follow-up with PCP.  MDM Rules/Calculators/A&P                       Final Clinical Impression(s) / ED Diagnoses Final diagnoses:  Acute pain of right shoulder    Rx / DC Orders ED Discharge Orders    None        Bethel Born, PA-C 04/07/20 1446  Alvira Monday, MD 04/07/20 2238

## 2020-04-07 NOTE — ED Triage Notes (Addendum)
C/o right UE pain started last night-denies injury-states she had covid vaccine in arm 4/28-pain worse with movement-NAD-steady gait

## 2020-04-07 NOTE — Discharge Instructions (Signed)
Take Ibuprofen 600mg  every 6-8 hours for pain and inflammation Apply a topical rub such as biofreeze, bengay, aspercreme for pain Use shoulder sling while awake - you can take it off for showering and sleeping Take Robaxin 500mg  for muscle pain (muscle relaxer)

## 2020-04-29 ENCOUNTER — Encounter (HOSPITAL_BASED_OUTPATIENT_CLINIC_OR_DEPARTMENT_OTHER): Payer: Self-pay | Admitting: Emergency Medicine

## 2020-04-29 ENCOUNTER — Emergency Department (HOSPITAL_BASED_OUTPATIENT_CLINIC_OR_DEPARTMENT_OTHER)
Admission: EM | Admit: 2020-04-29 | Discharge: 2020-04-29 | Disposition: A | Discharge status: Home / Self Care | Payer: No Typology Code available for payment source | Attending: Emergency Medicine | Admitting: Emergency Medicine

## 2020-04-29 ENCOUNTER — Emergency Department (HOSPITAL_BASED_OUTPATIENT_CLINIC_OR_DEPARTMENT_OTHER): Payer: No Typology Code available for payment source

## 2020-04-29 ENCOUNTER — Other Ambulatory Visit: Payer: Self-pay

## 2020-04-29 DIAGNOSIS — Z79899 Other long term (current) drug therapy: Secondary | ICD-10-CM | POA: Diagnosis not present

## 2020-04-29 DIAGNOSIS — E119 Type 2 diabetes mellitus without complications: Secondary | ICD-10-CM | POA: Diagnosis not present

## 2020-04-29 DIAGNOSIS — R059 Cough, unspecified: Secondary | ICD-10-CM

## 2020-04-29 DIAGNOSIS — R05 Cough: Secondary | ICD-10-CM | POA: Insufficient documentation

## 2020-04-29 DIAGNOSIS — Z20822 Contact with and (suspected) exposure to covid-19: Secondary | ICD-10-CM | POA: Insufficient documentation

## 2020-04-29 DIAGNOSIS — N760 Acute vaginitis: Secondary | ICD-10-CM | POA: Diagnosis not present

## 2020-04-29 DIAGNOSIS — B373 Candidiasis of vulva and vagina: Secondary | ICD-10-CM | POA: Insufficient documentation

## 2020-04-29 DIAGNOSIS — B9689 Other specified bacterial agents as the cause of diseases classified elsewhere: Secondary | ICD-10-CM | POA: Diagnosis not present

## 2020-04-29 DIAGNOSIS — F17210 Nicotine dependence, cigarettes, uncomplicated: Secondary | ICD-10-CM | POA: Diagnosis not present

## 2020-04-29 DIAGNOSIS — R1030 Lower abdominal pain, unspecified: Secondary | ICD-10-CM | POA: Diagnosis present

## 2020-04-29 DIAGNOSIS — B3731 Acute candidiasis of vulva and vagina: Secondary | ICD-10-CM

## 2020-04-29 LAB — SARS CORONAVIRUS 2 (TAT 6-24 HRS): SARS Coronavirus 2: NEGATIVE

## 2020-04-29 LAB — URINALYSIS, ROUTINE W REFLEX MICROSCOPIC
Bilirubin Urine: NEGATIVE
Glucose, UA: NEGATIVE mg/dL
Hgb urine dipstick: NEGATIVE
Ketones, ur: NEGATIVE mg/dL
Nitrite: NEGATIVE
Protein, ur: NEGATIVE mg/dL
Specific Gravity, Urine: 1.02 (ref 1.005–1.030)
pH: 6 (ref 5.0–8.0)

## 2020-04-29 LAB — URINALYSIS, MICROSCOPIC (REFLEX): RBC / HPF: NONE SEEN RBC/hpf (ref 0–5)

## 2020-04-29 LAB — WET PREP, GENITAL
Sperm: NONE SEEN
Trich, Wet Prep: NONE SEEN

## 2020-04-29 LAB — PREGNANCY, URINE: Preg Test, Ur: NEGATIVE

## 2020-04-29 MED ORDER — METRONIDAZOLE 500 MG PO TABS
500.0000 mg | ORAL_TABLET | Freq: Two times a day (BID) | ORAL | 0 refills | Status: DC
Start: 2020-04-29 — End: 2020-06-22

## 2020-04-29 MED ORDER — FLUCONAZOLE 150 MG PO TABS
150.0000 mg | ORAL_TABLET | Freq: Once | ORAL | Status: AC
  Administered 2020-04-29: 150 mg via ORAL
  Filled 2020-04-29: qty 1

## 2020-04-29 MED ORDER — FLUCONAZOLE 150 MG PO TABS
150.0000 mg | ORAL_TABLET | Freq: Once | ORAL | 0 refills | Status: AC
Start: 2020-04-29 — End: 2020-04-29

## 2020-04-29 MED ORDER — METRONIDAZOLE 500 MG PO TABS
500.0000 mg | ORAL_TABLET | Freq: Once | ORAL | Status: AC
  Administered 2020-04-29: 500 mg via ORAL
  Filled 2020-04-29: qty 1

## 2020-04-29 NOTE — ED Provider Notes (Signed)
[facility] EMERGENCY DEPARTMENT Provider Note   CSN: [id number] Arrival date & time: 04/29/20  0550     History Chief Complaint  Patient presents with  . Abdominal Pain  . Shortness of Breath    Glenda Mann is a 34 y.o. female.  The history is provided by the patient.  Abdominal Pain Pain location:  Suprapubic Pain quality: aching   Pain radiates to:  Does not radiate Pain severity:  Moderate Onset quality:  Gradual Timing:  Constant Progression:  Unchanged Chronicity:  Recurrent Context: not alcohol use, not laxative use, not retching and not sick contacts   Relieved by:  Nothing Worsened by:  Nothing Ineffective treatments:  None tried Associated symptoms: cough and vaginal discharge   Associated symptoms: no chest pain, no nausea and no vomiting   Risk factors: not elderly and not pregnant   Patient with h/o STI presents with low abdominal pain and discharge x 1 week.  Also has cough and chest congestion x more than one week.  No f/c/r.  No known covid contacts.  No OCPs.  No travel, no leg pain or swelling.  No chest pain, no dyspnea on exertion.  No loss of taste of smell.       Past Medical History:  Diagnosis Date  . BV (bacterial vaginosis)   . Chlamydia   . Gallstones   . Gestational diabetes   . Hidradenitis suppurativa   . Shingles     Patient Active Problem List   Diagnosis Date Noted  . 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 CHOLECYSTECTOMY    . INCISE AND DRAIN ABCESS       OB History    Gravida  2   Para  1   Term      Preterm      AB      Living        SAB      TAB      Ectopic      Multiple      Live Births              Family History  Problem Relation Age of Onset  . Migraines Mother   . Hypertension Maternal Grandmother   . Breast cancer Paternal Grandmother     Social History   Tobacco Use  . Smoking status: Current Every Day Smoker   Packs/day: 0.50    Types: Cigarettes  . Smokeless tobacco: Never Used  Substance Use Topics  . Alcohol use: Yes    Comment: drinks on weekends  . Drug use: No    Home Medications Prior to Admission medications   Medication Sig Start Date End Date Taking? Authorizing Provider  doxycycline (VIBRAMYCIN) 100 MG capsule Take 1 capsule (100 mg total) by mouth 2 (two) times daily. One po bid x 7 days 03/24/20   Molpus, John, MD  fluconazole (DIFLUCAN) 150 MG tablet Take 1 tablet on 03/27/2020 if yeast infection symptoms persist. 03/24/20   Molpus, John, MD  glipiZIDE (GLUCOTROL) 10 MG tablet Take 10 mg by mouth daily before breakfast.    [provider]  levonorgestrel (MIRENA) 20 MCG/24HR IUD by Intrauterine route.    [provider]  metFORMIN (GLUCOPHAGE) 1000 MG tablet Take 1,000 mg by mouth 2 (two) times daily with a meal.    [provider]  methocarbamol (ROBAXIN) 500 MG tablet Take 1 tablet (500 mg total) by mouth 2 (two) times  daily. 04/07/20   Recardo Evangelist, PA-C  ondansetron (ZOFRAN ODT) 8 MG disintegrating tablet Take 1 tablet (8 mg total) by mouth every 8 (eight) hours as needed for nausea or vomiting. 03/24/20   Molpus, Jenny Reichmann, MD  rosuvastatin (CRESTOR) 20 MG tablet Take 20 mg by mouth daily.    [provider]  Semaglutide (OZEMPIC, 1 MG/DOSE, [place]) Inject 1 mg/mL into the skin once a week.    [provider]    Allergies    Bactrim [sulfamethoxazole-trimethoprim], Penicillins, and Reglan [metoclopramide]  Review of Systems   Review of Systems  Constitutional: Negative for unexpected weight change.  HENT: Negative for congestion.   Eyes: Negative for visual disturbance.  Respiratory: Positive for cough.   Cardiovascular: Negative for chest pain, palpitations and leg swelling.  Gastrointestinal: Negative for nausea and vomiting.  Genitourinary: Positive for vaginal discharge.  Musculoskeletal: Negative for arthralgias.  Skin:  Negative for rash.  Neurological: Negative for dizziness.  Psychiatric/Behavioral: Negative for agitation.    Physical Exam Updated Vital Signs BP (!) 162/105 (BP Location: Right Arm)   Pulse (!) 103   Temp 99.6 F (37.6 C) (Oral)   Resp 18   Ht 5' (1.524 m)   Wt 104 kg   SpO2 100%   BMI 44.78 kg/m   Physical Exam Vitals and nursing note reviewed. Exam conducted with a chaperone present.  Constitutional:      General: She is not in acute distress.    Appearance: Normal appearance.  HENT:     Head: Normocephalic and atraumatic.     Nose: Nose normal.  Eyes:     Conjunctiva/sclera: Conjunctivae normal.     Pupils: Pupils are equal, round, and reactive to light.  Cardiovascular:     Rate and Rhythm: Normal rate and regular rhythm.     Pulses: Normal pulses.     Heart sounds: Normal heart sounds.  Pulmonary:     Effort: Pulmonary effort is normal. No respiratory distress.     Breath sounds: Normal breath sounds. No wheezing or rales.  Abdominal:     General: Abdomen is flat. Bowel sounds are normal.     Tenderness: There is no abdominal tenderness. There is no guarding or rebound.  Genitourinary:    Cervix: Discharge present. No cervical motion tenderness, erythema or cervical bleeding.     Adnexa: Right adnexa normal and left adnexa normal.  Musculoskeletal:        General: Normal range of motion.     Cervical back: Normal range of motion and neck supple.  Skin:    General: Skin is warm and dry.     Capillary Refill: Capillary refill takes less than 2 seconds.  Neurological:     General: No focal deficit present.     Mental Status: She is alert and oriented to person, place, and time.     Deep Tendon Reflexes: Reflexes normal.  Psychiatric:        Mood and Affect: Mood normal.        Behavior: Behavior normal.     ED Results / Procedures / Treatments   Labs (all labs ordered are listed, but only abnormal results are displayed) Labs Reviewed  WET PREP,  GENITAL  PREGNANCY, URINE  URINALYSIS, ROUTINE W REFLEX MICROSCOPIC  GC/CHLAMYDIA PROBE AMP ([place]) NOT AT [facility]    EKG None  Radiology No results found.  Procedures Procedures (including critical care time)  Medications Ordered in ED Medications - No data to display  ED Course  I have reviewed the triage vital signs and the nursing notes.  Pertinent labs & imaging results that were available during my care of the patient were reviewed by me and considered in my medical decision making (see chart for details).    PERC negative wells 0 highly doubt PE in this patient.  This is cough.  Will send covid swab recommend zyrtec and robitussin for cough.  I do not believe this is cardiac.  Patient is 100% on room air without pain, nausea nor vomiting.  No exertional symptoms.     Will treat for bacterial vaginosis and yeast infection.  I recommend following up with gynecology for ongoing care.     covid swab sent  Reilly Molchan was evaluated in Emergency Department on 04/29/2020 for the symptoms described in the history of present illness. She was evaluated in the context of the global COVID-19 pandemic, which necessitated consideration that the patient might be at risk for infection with the SARS-CoV-2 virus that causes COVID-19. Institutional protocols and algorithms that pertain to the evaluation of patients at risk for COVID-19 are in a state of rapid change based on information released by regulatory bodies including the CDC and federal and state organizations. These policies and algorithms were followed during the patient's care in the ED.   Final Clinical Impression(s) / ED Diagnoses Return for intractable cough, coughing up blood,fevers >100.4 unrelieved by medication, shortness of breath, intractable vomiting, chest pain, shortness of breath, weakness,numbness, changes in speech, facial asymmetry,abdominal pain, passing out,Inability to tolerate liquids or food, cough,  altered mental status or any concerns. No signs of systemic illness or infection. The patient is nontoxic-appearing on exam and vital signs are within normal limits.   I have reviewed the triage vital signs and the nursing notes. Pertinent labs &imaging results that were available during my care of the patient were reviewed by me and considered in my medical decision making (see chart for details).After history, exam, and medical workup I feel the patient has beenappropriately medically screened and is safe for discharge home. Pertinent diagnoses were discussed with the patient. Patient was given return precautions.    Josseline Reddin, MD 04/29/20 [phone]

## 2020-04-29 NOTE — Discharge Instructions (Signed)
Recommend taking zyrtec and robitussin for cough.  No infection on xray.  Follow up with your family provider for ongoing care.

## 2020-04-29 NOTE — ED Triage Notes (Signed)
Patient presents with complaints of lower abd pain and vaginal discharge; patient also co shortness of breath and cough. Ambulatory with steady gait. nad noted.

## 2020-04-30 LAB — GC/CHLAMYDIA PROBE AMP (~~LOC~~) NOT AT ARMC
Chlamydia: NEGATIVE
Comment: NEGATIVE
Comment: NORMAL
Neisseria Gonorrhea: NEGATIVE

## 2020-05-26 ENCOUNTER — Emergency Department (HOSPITAL_BASED_OUTPATIENT_CLINIC_OR_DEPARTMENT_OTHER)
Admission: EM | Admit: 2020-05-26 | Discharge: 2020-05-26 | Disposition: A | Discharge status: Home / Self Care | Payer: No Typology Code available for payment source | Attending: Emergency Medicine | Admitting: Emergency Medicine

## 2020-05-26 ENCOUNTER — Other Ambulatory Visit: Payer: Self-pay

## 2020-05-26 ENCOUNTER — Encounter (HOSPITAL_BASED_OUTPATIENT_CLINIC_OR_DEPARTMENT_OTHER): Payer: Self-pay | Admitting: Emergency Medicine

## 2020-05-26 DIAGNOSIS — N898 Other specified noninflammatory disorders of vagina: Secondary | ICD-10-CM | POA: Diagnosis present

## 2020-05-26 DIAGNOSIS — N760 Acute vaginitis: Secondary | ICD-10-CM | POA: Diagnosis not present

## 2020-05-26 DIAGNOSIS — F17210 Nicotine dependence, cigarettes, uncomplicated: Secondary | ICD-10-CM | POA: Diagnosis not present

## 2020-05-26 DIAGNOSIS — B3731 Acute candidiasis of vulva and vagina: Secondary | ICD-10-CM

## 2020-05-26 LAB — WET PREP, GENITAL
Clue Cells Wet Prep HPF POC: NONE SEEN
Sperm: NONE SEEN
Trich, Wet Prep: NONE SEEN

## 2020-05-26 MED ORDER — FLUCONAZOLE 150 MG PO TABS
150.0000 mg | ORAL_TABLET | Freq: Once | ORAL | Status: AC
  Administered 2020-05-26: 150 mg via ORAL
  Filled 2020-05-26: qty 1

## 2020-05-26 NOTE — ED Provider Notes (Signed)
[facility] EMERGENCY DEPARTMENT Provider Note   CSN: [id number] Arrival date & time: 05/26/20  0545     History Chief Complaint  Patient presents with  . Vaginal Discharge    Glenda Mann is a 34 y.o. female.  The history is provided by the patient.  Vaginal Discharge Quality:  Milky Severity:  Mild Onset quality:  Gradual Timing:  Constant Progression:  Unchanged Chronicity:  Recurrent Context: spontaneously   Relieved by:  Nothing Worsened by:  Nothing Ineffective treatments:  None tried Associated symptoms: no abdominal pain, no fever and no genital lesions   Risk factors: no endometriosis        Past Medical History:  Diagnosis Date  . BV (bacterial vaginosis)   . Chlamydia   . Gallstones   . Gestational diabetes   . Hidradenitis suppurativa   . Shingles     Patient Active Problem List   Diagnosis Date Noted  . 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 CHOLECYSTECTOMY    . INCISE AND DRAIN ABCESS       OB History    Gravida  2   Para  1   Term      Preterm      AB      Living        SAB      TAB      Ectopic      Multiple      Live Births              Family History  Problem Relation Age of Onset  . Migraines Mother   . Hypertension Maternal Grandmother   . Breast cancer Paternal Grandmother     Social History   Tobacco Use  . Smoking status: Current Every Day Smoker    Packs/day: 0.50    Types: Cigarettes  . Smokeless tobacco: Never Used  Vaping Use  . Vaping Use: Never used  Substance Use Topics  . Alcohol use: Yes    Comment: drinks on weekends  . Drug use: No    Home Medications Prior to Admission medications   Medication Sig Start Date End Date Taking? Authorizing Provider  doxycycline (VIBRAMYCIN) 100 MG capsule Take 1 capsule (100 mg total) by mouth 2 (two) times daily. One po bid x 7 days 03/24/20   Molpus, John, MD  fluconazole  (DIFLUCAN) 150 MG tablet Take 1 tablet on 03/27/2020 if yeast infection symptoms persist. 03/24/20   Molpus, John, MD  glipiZIDE (GLUCOTROL) 10 MG tablet Take 10 mg by mouth daily before breakfast.    [provider]  levonorgestrel (MIRENA) 20 MCG/24HR IUD by Intrauterine route.    [provider]  metFORMIN (GLUCOPHAGE) 1000 MG tablet Take 1,000 mg by mouth 2 (two) times daily with a meal.    [provider]  methocarbamol (ROBAXIN) 500 MG tablet Take 1 tablet (500 mg total) by mouth 2 (two) times daily. 04/07/20   Bethel Born, PA-C  metroNIDAZOLE (FLAGYL) 500 MG tablet Take 1 tablet (500 mg total) by mouth 2 (two) times daily. One po bid x 7 days 04/29/20   Jacque Garrels, MD  ondansetron (ZOFRAN ODT) 8 MG disintegrating tablet Take 1 tablet (8 mg total) by mouth every 8 (eight) hours as needed for nausea or vomiting. 03/24/20   Molpus, John, MD  rosuvastatin (CRESTOR) 20 MG tablet Take 20 mg by mouth daily.    [provider]  Semaglutide (  OZEMPIC, 1 MG/DOSE, [place]) Inject 1 mg/mL into the skin once a week.    [provider]    Allergies    Bactrim [sulfamethoxazole-trimethoprim], Penicillins, and Reglan [metoclopramide]  Review of Systems   Review of Systems  Constitutional: Negative for fever.  HENT: Negative for congestion.   Eyes: Negative for visual disturbance.  Respiratory: Negative for shortness of breath.   Gastrointestinal: Negative for abdominal pain.  Genitourinary: Positive for vaginal discharge.  Musculoskeletal: Negative for arthralgias.  Skin: Negative for rash.  Neurological: Negative for dizziness.  Psychiatric/Behavioral: Negative for agitation.  All other systems reviewed and are negative.   Physical Exam Updated Vital Signs BP (!) 155/108 (BP Location: Right Arm)   Pulse (!) 103   Temp 98.9 F (37.2 C) (Oral)   Resp 18   Ht 5\' 5"  (1.651 m)   SpO2 100%   BMI 38.15 kg/m   Physical Exam Vitals and nursing note  reviewed. Exam conducted with a chaperone present.  Constitutional:      General: She is not in acute distress.    Appearance: Normal appearance.  HENT:     Head: Normocephalic and atraumatic.     Nose: Nose normal.  Eyes:     Conjunctiva/sclera: Conjunctivae normal.     Pupils: Pupils are equal, round, and reactive to light.  Cardiovascular:     Rate and Rhythm: Normal rate and regular rhythm.     Pulses: Normal pulses.     Heart sounds: Normal heart sounds.  Pulmonary:     Effort: Pulmonary effort is normal.     Breath sounds: Normal breath sounds.  Abdominal:     General: Abdomen is flat. Bowel sounds are normal.     Tenderness: There is no abdominal tenderness. There is no guarding or rebound.  Genitourinary:    Cervix: No cervical motion tenderness.     Comments: Scant white discharge  Musculoskeletal:        General: Normal range of motion.     Cervical back: Normal range of motion and neck supple.  Skin:    General: Skin is warm and dry.     Capillary Refill: Capillary refill takes less than 2 seconds.  Neurological:     General: No focal deficit present.     Mental Status: She is alert and oriented to person, place, and time.     Deep Tendon Reflexes: Reflexes normal.  Psychiatric:        Mood and Affect: Mood normal.        Behavior: Behavior normal.     ED Results / Procedures / Treatments   Labs (all labs ordered are listed, but only abnormal results are displayed) Labs Reviewed  WET PREP, GENITAL - Abnormal; Notable for the following components:      Result Value   Yeast Wet Prep HPF POC PRESENT (*)    WBC, Wet Prep HPF POC MANY (*)    All other components within normal limits  GC/CHLAMYDIA PROBE AMP ([place]) NOT AT [facility]    EKG None  Radiology No results found.  Procedures Procedures (including critical care time)  Medications Ordered in ED Medications  fluconazole (DIFLUCAN) tablet 150 mg (has no administration in time range)    ED  Course  I have reviewed the triage vital signs and the nursing notes.  Pertinent labs & imaging results that were available during my care of the patient were reviewed by me and considered in my medical decision making (see chart for  details).    Will treat for candida vaginitis.  GC and chlamydia sent.  Follow up with your GYN for ongoing care and your annual pap smear.    Glenda Mann was evaluated in Emergency Department on 05/26/2020 for the symptoms described in the history of present illness. She was evaluated in the context of the global COVID-19 pandemic, which necessitated consideration that the patient might be at risk for infection with the SARS-CoV-2 virus that causes COVID-19. Institutional protocols and algorithms that pertain to the evaluation of patients at risk for COVID-19 are in a state of rapid change based on information released by regulatory bodies including the CDC and federal and state organizations. These policies and algorithms were followed during the patient's care in the ED.   Final Clinical Impression(s) / ED Diagnoses Final diagnoses:  Yeast vaginitis   Return for intractable cough, coughing up blood,fevers >100.4 unrelieved by medication, shortness of breath, intractable vomiting, chest pain, shortness of breath, weakness,numbness, changes in speech, facial asymmetry,abdominal pain, passing out,Inability to tolerate liquids or food, cough, altered mental status or any concerns. No signs of systemic illness or infection. The patient is nontoxic-appearing on exam and vital signs are within normal limits.   I have reviewed the triage vital signs and the nursing notes. Pertinent labs &imaging results that were available during my care of the patient were reviewed by me and considered in my medical decision making (see chart for details).After history, exam, and medical workup I feel the patient has beenappropriately medically screened and is safe for  discharge home. Pertinent diagnoses were discussed with the patient. Patient was given return precautions.    Koal Eslinger, MD 05/26/20 [id number]

## 2020-05-26 NOTE — ED Triage Notes (Signed)
Pt is c/o vaginal discharge, milky with clumps in it and a foul odor  Pt thinks it is either a yeast infection or BV

## 2020-05-27 LAB — GC/CHLAMYDIA PROBE AMP (~~LOC~~) NOT AT ARMC
Chlamydia: NEGATIVE
Comment: NEGATIVE
Comment: NORMAL
Neisseria Gonorrhea: NEGATIVE

## 2020-06-21 ENCOUNTER — Emergency Department (HOSPITAL_BASED_OUTPATIENT_CLINIC_OR_DEPARTMENT_OTHER)
Admission: EM | Admit: 2020-06-21 | Discharge: 2020-06-21 | Disposition: A | Discharge status: Home / Self Care | Payer: No Typology Code available for payment source | Attending: Emergency Medicine | Admitting: Emergency Medicine

## 2020-06-21 ENCOUNTER — Other Ambulatory Visit: Payer: Self-pay

## 2020-06-21 ENCOUNTER — Encounter (HOSPITAL_BASED_OUTPATIENT_CLINIC_OR_DEPARTMENT_OTHER): Payer: Self-pay | Admitting: *Deleted

## 2020-06-21 DIAGNOSIS — Z5321 Procedure and treatment not carried out due to patient leaving prior to being seen by health care provider: Secondary | ICD-10-CM | POA: Insufficient documentation

## 2020-06-21 DIAGNOSIS — N898 Other specified noninflammatory disorders of vagina: Secondary | ICD-10-CM | POA: Insufficient documentation

## 2020-06-21 LAB — URINALYSIS, ROUTINE W REFLEX MICROSCOPIC
Bilirubin Urine: NEGATIVE
Glucose, UA: NEGATIVE mg/dL
Hgb urine dipstick: NEGATIVE
Ketones, ur: NEGATIVE mg/dL
Leukocytes,Ua: NEGATIVE
Nitrite: NEGATIVE
Protein, ur: NEGATIVE mg/dL
Specific Gravity, Urine: >1.03 — ABNORMAL HIGH (ref 1.005–1.030)
pH: 5.5 (ref 5.0–8.0)

## 2020-06-21 LAB — PREGNANCY, URINE: Preg Test, Ur: NEGATIVE

## 2020-06-21 NOTE — ED Triage Notes (Signed)
Vaginal discharge with foul odor. She also has "bumps" around her mouth.

## 2020-06-22 ENCOUNTER — Other Ambulatory Visit: Payer: Self-pay

## 2020-06-22 ENCOUNTER — Encounter (HOSPITAL_BASED_OUTPATIENT_CLINIC_OR_DEPARTMENT_OTHER): Payer: Self-pay | Admitting: Emergency Medicine

## 2020-06-22 ENCOUNTER — Emergency Department (HOSPITAL_BASED_OUTPATIENT_CLINIC_OR_DEPARTMENT_OTHER)
Admission: EM | Admit: 2020-06-22 | Discharge: 2020-06-22 | Disposition: A | Discharge status: Home / Self Care | Payer: No Typology Code available for payment source | Attending: Emergency Medicine | Admitting: Emergency Medicine

## 2020-06-22 DIAGNOSIS — N760 Acute vaginitis: Secondary | ICD-10-CM | POA: Diagnosis not present

## 2020-06-22 DIAGNOSIS — F17210 Nicotine dependence, cigarettes, uncomplicated: Secondary | ICD-10-CM | POA: Insufficient documentation

## 2020-06-22 DIAGNOSIS — R1084 Generalized abdominal pain: Secondary | ICD-10-CM | POA: Diagnosis present

## 2020-06-22 MED ORDER — FLUCONAZOLE 150 MG PO TABS: 150.0000 mg | ORAL_TABLET | Freq: Once | ORAL | 0 refills | Status: AC

## 2020-06-22 MED ORDER — FLUCONAZOLE 150 MG PO TABS
150.0000 mg | ORAL_TABLET | Freq: Once | ORAL | Status: AC
  Administered 2020-06-22: 150 mg via ORAL
  Filled 2020-06-22: qty 1

## 2020-06-22 MED ORDER — METRONIDAZOLE 500 MG PO TABS
500.0000 mg | ORAL_TABLET | Freq: Two times a day (BID) | ORAL | 0 refills | Status: DC
Start: 2020-06-22 — End: 2021-03-23

## 2020-06-22 NOTE — ED Provider Notes (Signed)
[facility] EMERGENCY DEPARTMENT Provider Note   CSN: [id number] Arrival date & time: 06/22/20  6948     History Chief Complaint  Patient presents with  . Abdominal Pain    Glenda Mann is a 34 y.o. female.  The history is provided by the patient.  Abdominal Pain Pain location:  Generalized Pain severity:  Mild Timing:  Intermittent Progression:  Unchanged Chronicity:  New Relieved by:  Nothing Worsened by:  Nothing Associated symptoms: vaginal discharge   Associated symptoms: no diarrhea, no dysuria, no fever, no vaginal bleeding and no vomiting    Patient presents for abdominal pain and vaginal discharge.  Patient reports having whitish vaginal discharge.  She reports having bumps around her face.  No fevers or vomiting.    Past Medical History:  Diagnosis Date  . BV (bacterial vaginosis)   . Chlamydia   . Gallstones   . Gestational diabetes   . Hidradenitis suppurativa   . Shingles     Patient Active Problem List   Diagnosis Date Noted  . 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 CHOLECYSTECTOMY    . INCISE AND DRAIN ABCESS       OB History    Gravida  2   Para  1   Term      Preterm      AB      Living        SAB      TAB      Ectopic      Multiple      Live Births              Family History  Problem Relation Age of Onset  . Migraines Mother   . Hypertension Maternal Grandmother   . Breast cancer Paternal Grandmother     Social History   Tobacco Use  . Smoking status: Current Every Day Smoker    Packs/day: 0.50    Types: Cigarettes  . Smokeless tobacco: Never Used  Vaping Use  . Vaping Use: Never used  Substance Use Topics  . Alcohol use: Yes    Comment: drinks on weekends  . Drug use: No    Home Medications Prior to Admission medications   Medication Sig Start Date End Date Taking? Authorizing Provider  fluconazole (DIFLUCAN) 150 MG tablet Take 1  tablet (150 mg total) by mouth once for 1 dose. 06/22/20 06/22/20  [name], MD  glipiZIDE (GLUCOTROL) 10 MG tablet Take 10 mg by mouth daily before breakfast.    [provider]  levonorgestrel (MIRENA) 20 MCG/24HR IUD by Intrauterine route.    [provider]  metFORMIN (GLUCOPHAGE) 1000 MG tablet Take 1,000 mg by mouth 2 (two) times daily with a meal.    [provider]  metroNIDAZOLE (FLAGYL) 500 MG tablet Take 1 tablet (500 mg total) by mouth 2 (two) times daily. One po bid x 7 days 06/22/20   [name], MD  rosuvastatin (CRESTOR) 20 MG tablet Take 20 mg by mouth daily.    [provider]  Semaglutide (OZEMPIC, 1 MG/DOSE, [place]) Inject 1 mg/mL into the skin once a week.    [provider]    Allergies    Bactrim [sulfamethoxazole-trimethoprim], Penicillins, and Reglan [metoclopramide]  Review of Systems   Review of Systems  Constitutional: Negative for fever.  Gastrointestinal: Positive for abdominal pain. Negative for diarrhea and vomiting.  Genitourinary: Positive for vaginal discharge. Negative for dysuria  and vaginal bleeding.  All other systems reviewed and are negative.   Physical Exam Updated Vital Signs BP (!) 180/105 (BP Location: Left Arm)   Pulse 99   Temp 99.3 F (37.4 C) (Oral)   Resp 16   Ht 1.651 m (5\' 5" )   Wt (!) 108 kg   SpO2 100%   BMI 39.61 kg/m   Physical Exam CONSTITUTIONAL: Well developed/well nourished HEAD: Normocephalic/atraumatic EYES: EOMI ENMT: Mucous membranes moist, no herpetic lesions noted NECK: supple no meningeal signs CV: S1/S2 noted, no murmurs/rubs/gallops noted LUNGS: Lungs are clear to auscultation bilaterally, no apparent distress ABDOMEN: soft, nontender, no rebound or guarding, bowel sounds noted throughout abdomen GU:no cva tenderness NEURO: Pt is awake/alert/appropriate, moves all extremitiesx4.  No facial droop.   EXTREMITIES: full ROM SKIN: warm, color normal PSYCH:  no abnormalities of mood noted, alert and oriented to situation  ED Results / Procedures / Treatments   Labs (all labs ordered are listed, but only abnormal results are displayed) Labs Reviewed  GC/CHLAMYDIA PROBE AMP ([place]) NOT AT [facility]    EKG None  Radiology No results found.  Procedures Procedures   Medications Ordered in ED Medications  fluconazole (DIFLUCAN) tablet 150 mg (150 mg Oral Given 06/22/20 06/24/20)    ED Course  I have reviewed the triage vital signs and the nursing notes.  Pertinent labs   results that were available during my care of the patient were reviewed by me and considered in my medical decision making (see chart for details).    MDM Rules/Calculators/A&P                          Patient presents with reevaluation for abdominal cramping and vaginal discharge.  Patient presented to the ER yesterday but left prior to being seen.  She had a negative urinalysis and negative pregnancy test  This is the patient's seventh ER visit in 6 months.  She had multiple visits for vaginitis in the past.  Last pelvic performed was on June 30 and had a negative gonorrhea and chlamydia test then.  Patient reports she has had intercourse since that time and she may have been exposed to STDs and she is requesting repeat STD testing.  After discussion with patient, will send GC chlamydia testing via urine.  Will defer pelvic at this time as patient has had multiple pelvic exams recently.  Will start patient on Diflucan and Flagyl as she reports this discharge is similar to prior episodes Final Clinical Impression(s) / ED Diagnoses Final diagnoses:  Acute vaginitis    Rx / DC Orders ED Discharge Orders         Ordered    fluconazole (DIFLUCAN) 150 MG tablet   Once     Discontinue  Reprint     06/22/20 0609    metroNIDAZOLE (FLAGYL) 500 MG tablet  2 times daily     Discontinue  Reprint     06/22/20 06/24/20           [id number], MD 06/22/20 [phone]

## 2020-06-22 NOTE — ED Triage Notes (Signed)
Pt is c/o lower abd pain   Pt has a vaginal discharge cream colored and sometimes with an odor  Pt also c/o bumps around her mouth  Pt was seen here yesterday but was unable to stay as she had to go to work

## 2020-06-23 LAB — GC/CHLAMYDIA PROBE AMP (~~LOC~~) NOT AT ARMC
Chlamydia: NEGATIVE
Comment: NEGATIVE
Comment: NORMAL
Neisseria Gonorrhea: NEGATIVE

## 2020-08-23 ENCOUNTER — Emergency Department (HOSPITAL_BASED_OUTPATIENT_CLINIC_OR_DEPARTMENT_OTHER)
Admission: EM | Admit: 2020-08-23 | Discharge: 2020-08-23 | Disposition: A | Discharge status: Home / Self Care | Payer: No Typology Code available for payment source | Attending: Emergency Medicine | Admitting: Emergency Medicine

## 2020-08-23 ENCOUNTER — Encounter (HOSPITAL_BASED_OUTPATIENT_CLINIC_OR_DEPARTMENT_OTHER): Payer: Self-pay | Admitting: Emergency Medicine

## 2020-08-23 ENCOUNTER — Other Ambulatory Visit: Payer: Self-pay

## 2020-08-23 ENCOUNTER — Emergency Department (HOSPITAL_BASED_OUTPATIENT_CLINIC_OR_DEPARTMENT_OTHER): Payer: No Typology Code available for payment source

## 2020-08-23 DIAGNOSIS — R0602 Shortness of breath: Secondary | ICD-10-CM | POA: Diagnosis not present

## 2020-08-23 DIAGNOSIS — J069 Acute upper respiratory infection, unspecified: Secondary | ICD-10-CM | POA: Diagnosis not present

## 2020-08-23 DIAGNOSIS — F17210 Nicotine dependence, cigarettes, uncomplicated: Secondary | ICD-10-CM | POA: Insufficient documentation

## 2020-08-23 DIAGNOSIS — Z20822 Contact with and (suspected) exposure to covid-19: Secondary | ICD-10-CM | POA: Insufficient documentation

## 2020-08-23 DIAGNOSIS — R519 Headache, unspecified: Secondary | ICD-10-CM | POA: Diagnosis present

## 2020-08-23 LAB — RESPIRATORY PANEL BY RT PCR (FLU A&B, COVID)
Influenza A by PCR: NEGATIVE
Influenza B by PCR: NEGATIVE
SARS Coronavirus 2 by RT PCR: NEGATIVE

## 2020-08-23 MED ORDER — INDOMETHACIN 50 MG PO CAPS
50.0000 mg | ORAL_CAPSULE | Freq: Two times a day (BID) | ORAL | 0 refills | Status: DC
Start: 2020-08-23 — End: 2021-03-23

## 2020-08-23 MED ORDER — KETOROLAC TROMETHAMINE 30 MG/ML IJ SOLN
30.0000 mg | Freq: Once | INTRAMUSCULAR | Status: AC
  Administered 2020-08-23: 30 mg via INTRAMUSCULAR
  Filled 2020-08-23: qty 1

## 2020-08-23 MED ORDER — INDOMETHACIN 50 MG PO CAPS
50.0000 mg | ORAL_CAPSULE | Freq: Two times a day (BID) | ORAL | 0 refills | Status: DC
Start: 2020-08-23 — End: 2020-08-23

## 2020-08-23 MED ORDER — ALBUTEROL SULFATE HFA 108 (90 BASE) MCG/ACT IN AERS
2.0000 | INHALATION_SPRAY | Freq: Once | RESPIRATORY_TRACT | Status: AC
  Administered 2020-08-23: 2 via RESPIRATORY_TRACT
  Filled 2020-08-23: qty 6.7

## 2020-08-23 NOTE — ED Provider Notes (Signed)
[facility] EMERGENCY DEPARTMENT Provider Note   CSN: [id number] Arrival date & time: 08/23/20  0118     History Chief Complaint  Patient presents with  . Migraine  . URI    Glenda Mann is a 34 y.o. female.  HPI     This is a 34 year old female who presents with headache and upper respiratory symptoms.  Patient reports 2-week history of productive cough, rhinorrhea, congestion.  Reports chills without fevers.  Reports nausea.  No vomiting or diarrhea.  No known ill exposures.  Patient's daughter has similar symptoms.  She is a smoker and reports some shortness of breath as well.  She also states she has a frontal headache that comes and goes.  She has been taking NyQuil for her symptoms with minimal relief.  She denies any chest pain.  She is vaccinated for COVID-19.  She denies loss of sense of taste or smell.  Past Medical History:  Diagnosis Date  . BV (bacterial vaginosis)   . Chlamydia   . Gallstones   . Gestational diabetes   . Hidradenitis suppurativa   . Shingles     Patient Active Problem List   Diagnosis Date Noted  . 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 CHOLECYSTECTOMY    . INCISE AND DRAIN ABCESS       OB History    Gravida  2   Para  1   Term      Preterm      AB      Living        SAB      TAB      Ectopic      Multiple      Live Births              Family History  Problem Relation Age of Onset  . Migraines Mother   . Hypertension Maternal Grandmother   . Breast cancer Paternal Grandmother     Social History   Tobacco Use  . Smoking status: Current Every Day Smoker    Packs/day: 0.50    Types: Cigarettes  . Smokeless tobacco: Never Used  Vaping Use  . Vaping Use: Never used  Substance Use Topics  . Alcohol use: Yes    Comment: drinks on weekends  . Drug use: No    Home Medications Prior to Admission medications   Medication Sig Start Date End  Date Taking? Authorizing Provider  glipiZIDE (GLUCOTROL) 10 MG tablet Take 10 mg by mouth daily before breakfast.    [provider]  indomethacin (INDOCIN) 50 MG capsule Take 1 capsule (50 mg total) by mouth 2 (two) times daily with a meal. 08/23/20   Jerolyn Flenniken, Mayer Masker, MD  levonorgestrel (MIRENA) 20 MCG/24HR IUD by Intrauterine route.    [provider]  metFORMIN (GLUCOPHAGE) 1000 MG tablet Take 1,000 mg by mouth 2 (two) times daily with a meal.    [provider]  metroNIDAZOLE (FLAGYL) 500 MG tablet Take 1 tablet (500 mg total) by mouth 2 (two) times daily. One po bid x 7 days 06/22/20   [name], MD  rosuvastatin (CRESTOR) 20 MG tablet Take 20 mg by mouth daily.    [provider]  Semaglutide (OZEMPIC, 1 MG/DOSE, [place]) Inject 1 mg/mL into the skin once a week.    [provider]    Allergies    Bactrim [sulfamethoxazole-trimethoprim], Penicillins, and Reglan [metoclopramide]  Review of Systems  Review of Systems  Constitutional: Positive for chills. Negative for fever.  HENT: Positive for congestion.   Respiratory: Positive for cough and shortness of breath.   Cardiovascular: Negative for chest pain.  Gastrointestinal: Positive for nausea. Negative for abdominal pain, diarrhea and vomiting.  Genitourinary: Negative for dysuria.  All other systems reviewed and are negative.   Physical Exam Updated Vital Signs BP (!) 156/91 (BP Location: Right Arm)   Pulse 86   Temp 98.3 F (36.8 C) (Oral)   Resp 14   Ht 1.651 m (5\' 5" )   Wt 108 kg   SpO2 98%   BMI 39.62 kg/m   Physical Exam Vitals and nursing note reviewed.  Constitutional:      Appearance: She is well-developed.  HENT:     Head: Normocephalic and atraumatic.     Mouth/Throat:     Mouth: Mucous membranes are moist.     Pharynx: No oropharyngeal exudate or posterior oropharyngeal erythema.  Eyes:     Pupils: Pupils are equal, round, and reactive to light.    Cardiovascular:     Rate and Rhythm: Normal rate and regular rhythm.     Heart sounds: Normal heart sounds. No friction rub.  Pulmonary:     Effort: Pulmonary effort is normal. No respiratory distress.     Breath sounds: Wheezing present.     Comments: Good air movement, occasional wheeze noted Abdominal:     General: Bowel sounds are normal.     Palpations: Abdomen is soft.     Tenderness: There is no abdominal tenderness.  Musculoskeletal:     Cervical back: Normal range of motion and neck supple.     Right lower leg: No edema.     Left lower leg: No edema.  Skin:    General: Skin is warm and dry.  Neurological:     Mental Status: She is alert and oriented to person, place, and time.  Psychiatric:        Mood and Affect: Mood normal.     ED Results / Procedures / Treatments   Labs (all labs ordered are listed, but only abnormal results are displayed) Labs Reviewed  RESPIRATORY PANEL BY RT PCR (FLU A&B, COVID)    EKG None  ED ECG REPORT   Date: 08/23/2020  Rate: 84  Rhythm: normal sinus rhythm  QRS Axis: normal  Intervals: QT prolonged  Borderline  ST/T Wave abnormalities: normal  Conduction Disutrbances:none  Narrative Interpretation:   Old EKG Reviewed: none available  I have personally reviewed the EKG tracing and agree with the computerized printout as noted.   Radiology DG Chest Portable 1 View  Result Date: 08/23/2020 CLINICAL DATA:  Shortness of breath, cough, congestion and chest discomfort for 2 weeks, smoker EXAM: PORTABLE CHEST 1 VIEW COMPARISON:  Radiograph 05/25/2017 FINDINGS: Increased attenuation of the lungs likely related to patient body habitus and breast tissue. Accounting for body habitus, the lungs are clear. No consolidation, features of edema, pneumothorax, or effusion. Cardiac size appears similar to slightly increased from comparison study with a lobular configuration of the heart which could reflect true cardiomegaly or pericardial  effusion. No acute osseous or soft tissue abnormality. IMPRESSION: 1. Cardiac size appears similar to slightly increased from comparison study with a lobular configuration of the heart which could reflect cardiomegaly or pericardial effusion. 2. No other acute cardiopulmonary abnormality. Electronically Signed   By: 05/27/2017 M.D.   On: 08/23/2020 02:37    Procedures Procedures (including critical care time)  Medications Ordered in ED Medications  ketorolac (TORADOL) 30 MG/ML injection 30 mg (30 mg Intramuscular Given 08/23/20 0226)  albuterol (VENTOLIN HFA) 108 (90 Base) MCG/ACT inhaler 2 puff (2 puffs Inhalation Given 08/23/20 0200)    ED Course  I have reviewed the triage vital signs and the nursing notes.  Pertinent labs & imaging results that were available during my care of the patient were reviewed by me and considered in my medical decision making (see chart for details).    MDM Rules/Calculators/A&P                          Patient presents with 2 weeks of upper respiratory symptoms.  She is overall nontoxic and vital signs are reassuring.  She is afebrile.  Blood pressure slightly elevated 156/91.  Her physical exam is largely benign with the exception of an occasional wheeze.  She has good air movement and her pulse ox is 98%.  Given duration of symptoms, will obtain a chest x-ray to rule out pneumonia.  Given prevalence of COVID-19 in the community, will also obtain COVID-19 testing although the patient has been vaccinated.  Patient was given an inhaler for her wheezing.  Likely related to smoking as well.  We discussed smoking cessation.  Chest x-ray shows no evidence of pneumothorax or pneumonia.  She does have a slightly increased cardiac size when compared to prior.  Question cardiomegaly versus pericardial effusion.  I do not hear a friction rub on her physical exam.  EKG was obtained and shows no evidence of arrhythmia or ischemia.  No pulses paradoxus and patient does not  appear to be in tamponade.  Will start on indomethacin to cover for pericarditis and have her follow-up for an outpatient echocardiogram.  Covid testing is pending.  Recommend supportive measures.  Ania Levay was evaluated in Emergency Department on 08/23/2020 for the symptoms described in the history of present illness. She was evaluated in the context of the global COVID-19 pandemic, which necessitated consideration that the patient might be at risk for infection with the SARS-CoV-2 virus that causes COVID-19. Institutional protocols and algorithms that pertain to the evaluation of patients at risk for COVID-19 are in a state of rapid change based on information released by regulatory bodies including the CDC and federal and state organizations. These policies and algorithms were followed during the patient's care in the ED.  After history, exam, and medical workup I feel the patient has been appropriately medically screened and is safe for discharge home. Pertinent diagnoses were discussed with the patient. Patient was given return precautions.  Final Clinical Impression(s) / ED Diagnoses Final diagnoses:  Viral URI with cough  SOB (shortness of breath)    Rx / DC Orders ED Discharge Orders         Ordered    indomethacin (INDOCIN) 50 MG capsule  2 times daily with meals        08/23/20 0305           Karrissa Parchment, Mayer Masker, MD 08/23/20 [phone]

## 2020-08-23 NOTE — ED Triage Notes (Signed)
Reports headache for the last two weeks, with productive cough with thick sputum.  Also c/o nausea but no vomiting.  Also endorses chills and runny nose.  Tested for covid a month ago and was negative.

## 2020-08-23 NOTE — Discharge Instructions (Addendum)
You were seen today for upper respiratory symptoms.  You were tested for COVID-19.  This test is pending.  Call for results or review in MyChart.  Additionally your chest x-ray does not show evidence of pneumonia.  However, it does show slight enlargement of the heart.  You need to follow-up with cardiology for an echocardiogram.  Take indomethacin to cover for pericarditis.  Additionally, make sure that you are staying hydrated.

## 2020-10-16 ENCOUNTER — Emergency Department (HOSPITAL_BASED_OUTPATIENT_CLINIC_OR_DEPARTMENT_OTHER): Payer: No Typology Code available for payment source

## 2020-10-16 ENCOUNTER — Other Ambulatory Visit: Payer: Self-pay

## 2020-10-16 ENCOUNTER — Emergency Department (HOSPITAL_BASED_OUTPATIENT_CLINIC_OR_DEPARTMENT_OTHER)
Admission: EM | Admit: 2020-10-16 | Discharge: 2020-10-16 | Disposition: A | Discharge status: Home / Self Care | Payer: No Typology Code available for payment source | Attending: Emergency Medicine | Admitting: Emergency Medicine

## 2020-10-16 ENCOUNTER — Encounter (HOSPITAL_BASED_OUTPATIENT_CLINIC_OR_DEPARTMENT_OTHER): Payer: Self-pay | Admitting: Emergency Medicine

## 2020-10-16 DIAGNOSIS — E119 Type 2 diabetes mellitus without complications: Secondary | ICD-10-CM | POA: Diagnosis not present

## 2020-10-16 DIAGNOSIS — R197 Diarrhea, unspecified: Secondary | ICD-10-CM | POA: Diagnosis not present

## 2020-10-16 DIAGNOSIS — Z20822 Contact with and (suspected) exposure to covid-19: Secondary | ICD-10-CM | POA: Diagnosis not present

## 2020-10-16 DIAGNOSIS — F17210 Nicotine dependence, cigarettes, uncomplicated: Secondary | ICD-10-CM | POA: Insufficient documentation

## 2020-10-16 DIAGNOSIS — Z7984 Long term (current) use of oral hypoglycemic drugs: Secondary | ICD-10-CM | POA: Diagnosis not present

## 2020-10-16 DIAGNOSIS — M791 Myalgia, unspecified site: Secondary | ICD-10-CM | POA: Insufficient documentation

## 2020-10-16 DIAGNOSIS — R059 Cough, unspecified: Secondary | ICD-10-CM | POA: Diagnosis present

## 2020-10-16 DIAGNOSIS — J069 Acute upper respiratory infection, unspecified: Secondary | ICD-10-CM | POA: Insufficient documentation

## 2020-10-16 LAB — URINALYSIS, MICROSCOPIC (REFLEX)

## 2020-10-16 LAB — URINALYSIS, ROUTINE W REFLEX MICROSCOPIC
Bilirubin Urine: NEGATIVE
Glucose, UA: NEGATIVE mg/dL
Ketones, ur: NEGATIVE mg/dL
Nitrite: NEGATIVE
Protein, ur: NEGATIVE mg/dL
Specific Gravity, Urine: 1.025 (ref 1.005–1.030)
pH: 6 (ref 5.0–8.0)

## 2020-10-16 LAB — CBG MONITORING, ED: Glucose-Capillary: 159 mg/dL — ABNORMAL HIGH (ref 70–99)

## 2020-10-16 LAB — RESP PANEL BY RT-PCR (FLU A&B, COVID) ARPGX2
Influenza A by PCR: NEGATIVE
Influenza B by PCR: NEGATIVE
SARS Coronavirus 2 by RT PCR: NEGATIVE

## 2020-10-16 MED ORDER — ACETAMINOPHEN 500 MG PO TABS
1000.0000 mg | ORAL_TABLET | Freq: Once | ORAL | Status: AC
  Administered 2020-10-16: 1000 mg via ORAL
  Filled 2020-10-16: qty 2

## 2020-10-16 NOTE — ED Provider Notes (Signed)
[facility] EMERGENCY DEPARTMENT Provider Note   CSN: [id number] Arrival date & time: 10/16/20  8546     History Chief Complaint  Patient presents with  . Cough    Glenda Mann is a 34 y.o. female.  HPI Patient reports that 6 days ago she started getting some diarrhea.  She did not think too much about it but by Wednesday she started feeling ill.  She reports that she started losing any taste of food, she developed body aches.  She reports she feels fatigued and she has a aching headache.  She reports she is also started to cough and feels like her breathing is slightly difficult.  He denies swelling or pain in the legs.  Patient reports that she has been immunized for Covid last immunization completed in May.  Patient reports she is diabetic and compliant with oral agents.  She reports she does not check her blood sugar routinely but typically it runs in the mid 200s.  She requests her blood sugar be checked today as well.  Patient denies any diagnosed hypertension but does admit that her blood pressures are sometimes high when they are checked.    Past Medical History:  Diagnosis Date  . BV (bacterial vaginosis)   . Chlamydia   . Gallstones   . Gestational diabetes   . Hidradenitis suppurativa   . Shingles     Patient Active Problem List   Diagnosis Date Noted  . 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 CHOLECYSTECTOMY    . INCISE AND DRAIN ABCESS       OB History    Gravida  2   Para  1   Term      Preterm      AB      Living        SAB      TAB      Ectopic      Multiple      Live Births              Family History  Problem Relation Age of Onset  . Migraines Mother   . Hypertension Maternal Grandmother   . Breast cancer Paternal Grandmother     Social History   Tobacco Use  . Smoking status: Current Every Day Smoker    Packs/day: 0.50    Types: Cigarettes  . Smokeless  tobacco: Never Used  Vaping Use  . Vaping Use: Never used  Substance Use Topics  . Alcohol use: Yes    Comment: drinks on weekends  . Drug use: No    Home Medications Prior to Admission medications   Medication Sig Start Date End Date Taking? Authorizing Provider  glipiZIDE (GLUCOTROL) 10 MG tablet Take 10 mg by mouth daily before breakfast.    [provider]  indomethacin (INDOCIN) 50 MG capsule Take 1 capsule (50 mg total) by mouth 2 (two) times daily with a meal. 08/23/20   Horton, Mayer Masker, MD  levonorgestrel (MIRENA) 20 MCG/24HR IUD by Intrauterine route.    [provider]  metFORMIN (GLUCOPHAGE) 1000 MG tablet Take 1,000 mg by mouth 2 (two) times daily with a meal.    [provider]  metroNIDAZOLE (FLAGYL) 500 MG tablet Take 1 tablet (500 mg total) by mouth 2 (two) times daily. One po bid x 7 days 06/22/20   [name], MD  rosuvastatin (CRESTOR) 20 MG tablet Take 20 mg by mouth daily.  [provider]  Semaglutide (OZEMPIC, 1 MG/DOSE, [place]) Inject 1 mg/mL into the skin once a week.    [provider]    Allergies    Bactrim [sulfamethoxazole-trimethoprim], Penicillins, and Reglan [metoclopramide]  Review of Systems   Review of Systems 10 systems reviewed and negative except as per HPI Physical Exam Updated Vital Signs BP 125/87 (BP Location: Right Arm)   Pulse 80   Temp 98.9 F (37.2 C) (Oral)   Resp 16   Ht 5\' 5"  (1.651 m)   Wt 107.5 kg   SpO2 95%   BMI 39.44 kg/m   Physical Exam Constitutional:      Comments: Alert and nontoxic.  Mental status clear.  No respiratory distress.  HENT:     Head: Normocephalic and atraumatic.     Nose: Nose normal.     Mouth/Throat:     Mouth: Mucous membranes are moist.     Pharynx: Oropharynx is clear.  Eyes:     Extraocular Movements: Extraocular movements intact.     Conjunctiva/sclera: Conjunctivae normal.     Pupils: Pupils are equal, round, and reactive to light.    Cardiovascular:     Comments: Borderline tachycardia no rub murmur gallop Pulmonary:     Effort: Pulmonary effort is normal.     Breath sounds: Normal breath sounds.  Abdominal:     General: There is no distension.     Palpations: Abdomen is soft.     Tenderness: There is no abdominal tenderness. There is no guarding.  Musculoskeletal:        General: No swelling or tenderness. Normal range of motion.     Right lower leg: No edema.     Left lower leg: No edema.  Skin:    General: Skin is warm and dry.  Neurological:     General: No focal deficit present.     Mental Status: She is oriented to person, place, and time.     Coordination: Coordination normal.  Psychiatric:        Mood and Affect: Mood normal.     ED Results / Procedures / Treatments   Labs (all labs ordered are listed, but only abnormal results are displayed) Labs Reviewed  URINALYSIS, ROUTINE W REFLEX MICROSCOPIC - Abnormal; Notable for the following components:      Result Value   APPearance HAZY (*)    Hgb urine dipstick TRACE (*)    Leukocytes,Ua MODERATE (*)    All other components within normal limits  URINALYSIS, MICROSCOPIC (REFLEX) - Abnormal; Notable for the following components:   Bacteria, UA MANY (*)    All other components within normal limits  CBG MONITORING, ED - Abnormal; Notable for the following components:   Glucose-Capillary 159 (*)    All other components within normal limits  RESP PANEL BY RT-PCR (FLU A&B, COVID) [id number]  URINE CULTURE    EKG None  Radiology DG Chest Port 1 View  Result Date: 10/16/2020 CLINICAL DATA:  COVID symptoms. EXAM: PORTABLE CHEST 1 VIEW COMPARISON:  08/23/2020. FINDINGS: Similar enlarged cardiac silhouette with slightly less globular appearance. No consolidation. No visible pleural effusions or pneumothorax. No acute osseous abnormality. IMPRESSION: 1. No acute cardiopulmonary disease. Similar appearance of the chest in comparison to prior. 2.  Cardiomegaly. Electronically Signed   By: 08/25/2020 MD   On: 10/16/2020 10:39    Procedures Procedures (including critical care time)  Medications Ordered in ED Medications  acetaminophen (TYLENOL) tablet 1,000 mg (1,000 mg Oral Given  10/16/20 1001)    ED Course  I have reviewed the triage vital signs and the nursing notes.  Pertinent labs & imaging results that were available during my care of the patient were reviewed by me and considered in my medical decision making (see chart for details).    MDM Rules/Calculators/A&P                         Patient describes symptoms suggestive of Covid.  Patient however is vaccinated and Covid testing is negative.  Chest x-ray is clear.  Patient does not have any respiratory distress.  Lungs are clear.  Patient also describes having diarrhea for about a week and body aches.  Patient has type 2 diabetes.  At this time, clinically patient is well in appearance.  No signs of clinical dehydration.  No ketones in urine or elevated specific gravity.  Patient is not hypotensive or tachycardia.  At this time very low suspicion for dehydration.  Patient's blood sugar is stable in the 150s.  Patient reports she is being compliant with her medication currently.  Urinalysis shows signs of slight contaminant.  Patient does not endorse GU symptoms.  Will culture for any significant bacterial growth but at this time does not appear to need empiric treatment.  Patient is stable for symptomatic home management.  She has an inhaler at home to use as needed for coughing or wheezing.  Patient be instructed to take Tylenol and stay hydrated.  She is instructed to continue monitoring her blood sugars and blood pressure at home.  Return precautions reviewed. Final Clinical Impression(s) / ED Diagnoses Final diagnoses:  Viral URI with cough  Myalgia  Lab test negative for COVID-19 virus  Type 2 diabetes mellitus without complication, without long-term current use of  insulin (HCC)    Rx / DC Orders ED Discharge Orders    None       Arby Barrette, MD 10/16/20 1155

## 2020-10-16 NOTE — ED Triage Notes (Addendum)
Cough, bodyaches, change in taste since Wednesday. Pt also requesting to have her blood sugar checked.

## 2020-10-16 NOTE — Discharge Instructions (Signed)
1.  Your Covid test has returned negative.  At this time, with symptoms of cough and body ache and diarrhea, symptoms are suggestive of a viral illness other than Covid.  Your vital signs and blood sugar are normal.  Currently, there does not appear to be any indication for antibiotics or other medications. 2.  Rest at home, take Tylenol for pain.  Use your albuterol inhaler for cough or wheeze.  Stay hydrated.  Monitor your blood sugars and your blood pressure. 3.  Schedule recheck with your doctor within 3 days.  Return to the emergency department if you develop a fever, increased difficulty breathing, blood sugars are suddenly going up despite treatment, you develop vomiting and cannot tolerate fluids or other concerning symptoms.

## 2020-10-17 LAB — URINE CULTURE: Culture: 20000 — AB

## 2020-10-18 ENCOUNTER — Telehealth: Payer: Self-pay

## 2020-10-18 NOTE — Telephone Encounter (Signed)
No treatment for [facility] on 10/16/20 Per  Lilli Light Pharm D

## 2020-12-21 ENCOUNTER — Emergency Department (HOSPITAL_BASED_OUTPATIENT_CLINIC_OR_DEPARTMENT_OTHER)
Admission: EM | Admit: 2020-12-21 | Discharge: 2020-12-21 | Disposition: A | Discharge status: Home / Self Care | Payer: No Typology Code available for payment source | Attending: Emergency Medicine | Admitting: Emergency Medicine

## 2020-12-21 ENCOUNTER — Emergency Department (HOSPITAL_BASED_OUTPATIENT_CLINIC_OR_DEPARTMENT_OTHER): Payer: No Typology Code available for payment source

## 2020-12-21 ENCOUNTER — Encounter (HOSPITAL_BASED_OUTPATIENT_CLINIC_OR_DEPARTMENT_OTHER): Payer: Self-pay

## 2020-12-21 ENCOUNTER — Other Ambulatory Visit: Payer: Self-pay

## 2020-12-21 DIAGNOSIS — F17210 Nicotine dependence, cigarettes, uncomplicated: Secondary | ICD-10-CM | POA: Diagnosis not present

## 2020-12-21 DIAGNOSIS — R059 Cough, unspecified: Secondary | ICD-10-CM | POA: Diagnosis present

## 2020-12-21 DIAGNOSIS — E1165 Type 2 diabetes mellitus with hyperglycemia: Secondary | ICD-10-CM | POA: Insufficient documentation

## 2020-12-21 DIAGNOSIS — U071 COVID-19: Secondary | ICD-10-CM | POA: Insufficient documentation

## 2020-12-21 DIAGNOSIS — Z8719 Personal history of other diseases of the digestive system: Secondary | ICD-10-CM | POA: Diagnosis not present

## 2020-12-21 DIAGNOSIS — R110 Nausea: Secondary | ICD-10-CM | POA: Insufficient documentation

## 2020-12-21 DIAGNOSIS — R1084 Generalized abdominal pain: Secondary | ICD-10-CM | POA: Diagnosis not present

## 2020-12-21 DIAGNOSIS — Z7984 Long term (current) use of oral hypoglycemic drugs: Secondary | ICD-10-CM | POA: Diagnosis not present

## 2020-12-21 LAB — CBG MONITORING, ED: Glucose-Capillary: 248 mg/dL — ABNORMAL HIGH (ref 70–99)

## 2020-12-21 MED ORDER — ONDANSETRON 4 MG PO TBDP
4.0000 mg | ORAL_TABLET | Freq: Once | ORAL | Status: AC
  Administered 2020-12-21: 4 mg via ORAL
  Filled 2020-12-21: qty 1

## 2020-12-21 MED ORDER — ONDANSETRON 4 MG PO TBDP: 4.0000 mg | ORAL_TABLET | Freq: Three times a day (TID) | ORAL | 0 refills | Status: DC | PRN

## 2020-12-21 NOTE — ED Provider Notes (Signed)
[facility] EMERGENCY DEPARTMENT Provider Note   CSN: [id number] Arrival date & time: 12/21/20  1104     History Chief Complaint  Patient presents with  . Covid Symptoms    Glenda Mann is a 35 y.o. female.  HPI 35 year old female past medical history significant for diabetes presents to the emergency department today for evaluation of COVID-19 symptoms.  Patient reports that her symptoms began yesterday.  She reports nausea, diarrhea, cough, nasal congestion, sore throat, headache, body aches.  No fevers.  Daughter tested positive for Covid the prior day.  Patient did take an at home antigen test today that was positive.  Patient reports that she needs documentation for her work.  Patient denies any vomiting.  No urinary symptoms.  Her blood sugar is elevated in the ER however patient reports this is her baseline and she did drink lots of juice before coming to the ER to stay hydrated.  Patient reports some mild shortness of breath.  No chest pain.  She reports generalized abdominal pain.  She has been taking over-the-counter medications for symptoms.    Past Medical History:  Diagnosis Date  . BV (bacterial vaginosis)   . Chlamydia   . Gallstones   . Gestational diabetes   . Hidradenitis suppurativa   . Shingles     Patient Active Problem List   Diagnosis Date Noted  . 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 CHOLECYSTECTOMY    . INCISE AND DRAIN ABCESS       OB History    Gravida  2   Para  1   Term      Preterm      AB      Living        SAB      IAB      Ectopic      Multiple      Live Births              Family History  Problem Relation Age of Onset  . Migraines Mother   . Hypertension Maternal Grandmother   . Breast cancer Paternal Grandmother     Social History   Tobacco Use  . Smoking status: Current Every Day Smoker    Packs/day: 0.50    Types: Cigarettes  .  Smokeless tobacco: Never Used  Vaping Use  . Vaping Use: Never used  Substance Use Topics  . Alcohol use: Yes    Comment: drinks on weekends  . Drug use: No    Home Medications Prior to Admission medications   Medication Sig Start Date End Date Taking? Authorizing Provider  ondansetron (ZOFRAN ODT) 4 MG disintegrating tablet Take 1 tablet (4 mg total) by mouth every 8 (eight) hours as needed for nausea or vomiting. 12/21/20  Yes Kel Senn, Lynann Beaver, PA-C  glipiZIDE (GLUCOTROL) 10 MG tablet Take 10 mg by mouth daily before breakfast.    [provider]  indomethacin (INDOCIN) 50 MG capsule Take 1 capsule (50 mg total) by mouth 2 (two) times daily with a meal. 08/23/20   Horton, Mayer Masker, MD  levonorgestrel (MIRENA) 20 MCG/24HR IUD by Intrauterine route.    [provider]  metFORMIN (GLUCOPHAGE) 1000 MG tablet Take 1,000 mg by mouth 2 (two) times daily with a meal.    [provider]  metroNIDAZOLE (FLAGYL) 500 MG tablet Take 1 tablet (500 mg total) by mouth 2 (two) times daily. One po bid x  7 days 06/22/20   [name], MD  rosuvastatin (CRESTOR) 20 MG tablet Take 20 mg by mouth daily.    [provider]  Semaglutide (OZEMPIC, 1 MG/DOSE, [place]) Inject 1 mg/mL into the skin once a week.    [provider]    Allergies    Bactrim [sulfamethoxazole-trimethoprim], Penicillins, and Reglan [metoclopramide]  Review of Systems   Review of Systems  Constitutional: Positive for chills. Negative for fever.  HENT: Positive for congestion, rhinorrhea and sore throat.   Eyes: Negative for discharge.  Respiratory: Positive for cough and shortness of breath.   Gastrointestinal: Positive for abdominal pain, diarrhea and nausea.  Genitourinary: Negative for difficulty urinating.  Musculoskeletal: Positive for arthralgias and myalgias.  Skin: Negative for color change.  Neurological: Positive for headaches.  Psychiatric/Behavioral: Negative for  confusion.    Physical Exam Updated Vital Signs BP 130/89   Pulse 100   Temp 98.2 F (36.8 C) (Oral)   Resp 18   Ht 5\' 5"  (1.651 m)   Wt 108 kg   SpO2 96%   BMI 39.62 kg/m   Physical Exam Vitals and nursing note reviewed.  Constitutional:      General: She is not in acute distress.    Appearance: She is well-developed and well-nourished. She is not ill-appearing or toxic-appearing.  HENT:     Head: Normocephalic and atraumatic.  Eyes:     General: No scleral icterus.       Right eye: No discharge.        Left eye: No discharge.  Cardiovascular:     Rate and Rhythm: Normal rate and regular rhythm.     Pulses: Normal pulses.     Heart sounds: Normal heart sounds. No murmur heard. No friction rub. No gallop.   Pulmonary:     Effort: Pulmonary effort is normal. No respiratory distress.     Breath sounds: Normal breath sounds. No stridor. No wheezing, rhonchi or rales.  Chest:     Chest wall: No tenderness.  Abdominal:     General: Abdomen is flat. Bowel sounds are normal.     Palpations: Abdomen is soft.     Tenderness: There is abdominal tenderness (generalized).  Musculoskeletal:        General: Normal range of motion.     Cervical back: Normal range of motion and neck supple. No rigidity.  Lymphadenopathy:     Cervical: No cervical adenopathy.  Skin:    General: Skin is warm and dry.     Capillary Refill: Capillary refill takes less than 2 seconds.     Coloration: Skin is not pale.  Neurological:     Mental Status: She is alert.  Psychiatric:        Behavior: Behavior normal.        Thought Content: Thought content normal.        Judgment: Judgment normal.     ED Results / Procedures / Treatments   Labs (all labs ordered are listed, but only abnormal results are displayed) Labs Reviewed  CBG MONITORING, ED - Abnormal; Notable for the following components:      Result Value   Glucose-Capillary 248 (*)    All other components within normal limits     EKG None  Radiology DG Chest Portable 1 View  Result Date: 12/21/2020 CLINICAL DATA:  Shortness of breath. EXAM: PORTABLE CHEST 1 VIEW COMPARISON:  October 16, 2020 FINDINGS: Lungs are clear. Heart is mildly enlarged, stable, with pulmonary vascularity normal. No  evident adenopathy. No bone lesions. IMPRESSION: Stable cardiac prominence.  Lungs clear. Electronically Signed   By: Bretta Bang III M.D.   On: 12/21/2020 12:13    Procedures Procedures   Medications Ordered in ED Medications  ondansetron (ZOFRAN-ODT) disintegrating tablet 4 mg (4 mg Oral Given 12/21/20 1357)    ED Course  I have reviewed the triage vital signs and the nursing notes.  Pertinent labs & imaging results that were available during my care of the patient were reviewed by me and considered in my medical decision making (see chart for details).    MDM Rules/Calculators/A&P                         35 year old presents the ER for concern for COVID-19.  Had at home positive Covid test last night.  Daughter with COVID-19 as well.  Patient very well-appearing on exam.  Sugar mildly elevated however consistent with prior blood sugars given patient's history of diabetes that is not well controlled on medication.  Patient's lungs clear to auscultation.  Doubt pneumonia.  She has not hypoxic.  No indication for admission at this time.  Current symptomatic treatment at home.  Patient has some generalized abdominal discomfort but no focal abdominal pain to suspect acute infection.  Will provide nausea medication.  Discussed quarantine precautions and reasons to return to the ER.  Pt is hemodynamically stable, in NAD, & able to ambulate in the ED. Evaluation does not show pathology that would require ongoing emergent intervention or inpatient treatment. I explained the diagnosis to the patient. Pt has no complaints prior to dc. Pt is comfortable with above plan and is stable for discharge at this time. All questions  were answered prior to disposition. Strict return precautions for f/u to the ED were discussed. Encouraged follow up with PCP.   Final Clinical Impression(s) / ED Diagnoses Final diagnoses:  COVID-19    Rx / DC Orders ED Discharge Orders         Ordered    ondansetron (ZOFRAN ODT) 4 MG disintegrating tablet  Every 8 hours PRN        12/21/20 1359           Rise Mu, PA-C 12/21/20 1424    Pricilla Loveless, MD 12/23/20 0730

## 2020-12-21 NOTE — ED Notes (Signed)
Pt. Reports she is having off smell and off taste today.  Pt. Said she has been exposed to people who had covid.  Pt. Reports nausea and cough.  Pt. Is in no distress.

## 2020-12-21 NOTE — ED Notes (Signed)
ED Provider explaining to Pt. About what to do about what to do at home

## 2020-12-21 NOTE — Discharge Instructions (Addendum)
You need quarantine for 7 days since onset of symptoms yesterday.  If you have fever on day 7 will need quarantine for additional 2 days.  Continue symptomatic treatment at home with Motrin Tylenol for your headache and body aches.  Drink plenty of fluids.  Have given you Zofran for nausea and vomiting.  You can take over-the-counter Imodium for diarrhea.  If you develop any worsening symptoms return to the ER

## 2020-12-21 NOTE — ED Notes (Signed)
ED Provider at bedside. 

## 2020-12-21 NOTE — ED Triage Notes (Signed)
Pt states had a positive home test. C/o diarrhea, cough, runny nose, loss of taste/smell, headache & shortness of breath.

## 2021-02-14 ENCOUNTER — Other Ambulatory Visit: Payer: Self-pay

## 2021-02-14 ENCOUNTER — Emergency Department (HOSPITAL_BASED_OUTPATIENT_CLINIC_OR_DEPARTMENT_OTHER)
Admission: EM | Admit: 2021-02-14 | Discharge: 2021-02-14 | Disposition: A | Discharge status: Home / Self Care | Payer: No Typology Code available for payment source | Attending: Emergency Medicine | Admitting: Emergency Medicine

## 2021-02-14 ENCOUNTER — Encounter (HOSPITAL_BASED_OUTPATIENT_CLINIC_OR_DEPARTMENT_OTHER): Payer: Self-pay | Admitting: Emergency Medicine

## 2021-02-14 DIAGNOSIS — F17210 Nicotine dependence, cigarettes, uncomplicated: Secondary | ICD-10-CM | POA: Diagnosis not present

## 2021-02-14 DIAGNOSIS — A6004 Herpesviral vulvovaginitis: Secondary | ICD-10-CM | POA: Insufficient documentation

## 2021-02-14 DIAGNOSIS — N760 Acute vaginitis: Secondary | ICD-10-CM | POA: Diagnosis not present

## 2021-02-14 DIAGNOSIS — N898 Other specified noninflammatory disorders of vagina: Secondary | ICD-10-CM

## 2021-02-14 DIAGNOSIS — L732 Hidradenitis suppurativa: Secondary | ICD-10-CM | POA: Diagnosis not present

## 2021-02-14 DIAGNOSIS — B9689 Other specified bacterial agents as the cause of diseases classified elsewhere: Secondary | ICD-10-CM

## 2021-02-14 LAB — WET PREP, GENITAL
Sperm: NONE SEEN
Trich, Wet Prep: NONE SEEN
Yeast Wet Prep HPF POC: NONE SEEN

## 2021-02-14 LAB — URINALYSIS, ROUTINE W REFLEX MICROSCOPIC
Bilirubin Urine: NEGATIVE
Glucose, UA: NEGATIVE mg/dL
Hgb urine dipstick: NEGATIVE
Ketones, ur: NEGATIVE mg/dL
Leukocytes,Ua: NEGATIVE
Nitrite: NEGATIVE
Protein, ur: NEGATIVE mg/dL
Specific Gravity, Urine: >1.03 — ABNORMAL HIGH (ref 1.005–1.030)
pH: 6 (ref 5.0–8.0)

## 2021-02-14 LAB — PREGNANCY, URINE: Preg Test, Ur: NEGATIVE

## 2021-02-14 MED ORDER — METRONIDAZOLE 500 MG PO TABS: 500.0000 mg | ORAL_TABLET | Freq: Two times a day (BID) | ORAL | 0 refills | Status: AC

## 2021-02-14 MED ORDER — FLUCONAZOLE 150 MG PO TABS
150.0000 mg | ORAL_TABLET | Freq: Once | ORAL | Status: AC
  Administered 2021-02-14: 150 mg via ORAL
  Filled 2021-02-14: qty 1

## 2021-02-14 MED ORDER — DOXYCYCLINE HYCLATE 100 MG PO CAPS: 100.0000 mg | ORAL_CAPSULE | Freq: Two times a day (BID) | ORAL | 0 refills | Status: AC

## 2021-02-14 MED ORDER — FLUCONAZOLE 200 MG PO TABS: 200.0000 mg | ORAL_TABLET | Freq: Once | ORAL | 0 refills | Status: AC

## 2021-02-14 MED ORDER — DOXYCYCLINE HYCLATE 100 MG PO TABS
100.0000 mg | ORAL_TABLET | Freq: Once | ORAL | Status: AC
  Administered 2021-02-14: 100 mg via ORAL
  Filled 2021-02-14: qty 1

## 2021-02-14 MED ORDER — FLUCONAZOLE 200 MG PO TABS
200.0000 mg | ORAL_TABLET | Freq: Once | ORAL | Status: DC
Start: 2021-02-14 — End: 2021-02-14

## 2021-02-14 MED ORDER — METRONIDAZOLE 500 MG PO TABS
500.0000 mg | ORAL_TABLET | Freq: Once | ORAL | Status: AC
  Administered 2021-02-14: 500 mg via ORAL
  Filled 2021-02-14: qty 1

## 2021-02-14 MED ORDER — VALACYCLOVIR HCL 1 G PO TABS: 1000.0000 mg | ORAL_TABLET | Freq: Three times a day (TID) | ORAL | 0 refills | Status: AC

## 2021-02-14 NOTE — Discharge Instructions (Addendum)
Instructions  You were diagnosed with Bacterial Vaginosis.  You will take 7 days of Flagyl for this.  Do NOT drink alcohol while taking this medicine - it will make you very sick.  You were also diagnosed with genital herpes.  You will take 7 days of Valtrex, an anti-viral medicine, for this.  Take it as prescribed.  You were also started on 7 days of doxycycline, an antibiotic.  This is to treat your skin infection and also as early coverage for Chlamydia.  (We do NOT have your results for the chlamydia test today.)  Please apply warm compresses or clean, hot soaked towels to the "boil" on your lower abdomen.  This is likely an ingrown hair or early abscess.  It may start draining on its own.  It was too small to cut open in the ER today.  *  We decided NOT to start treatment for Gonorrhea today because of all the medicines we are starting you on.  If you test positive for Gonorrhea, you will get a phone call telling you to return to the ER or an urgent care for an injection as treatment.   *  Finally, I prescribed Fluconazole, as a single tablet.  This is to treat for a possible yeast infection if you develop itching or yeast symptoms after finishing your antibiotics.  Please avoid any sex for at least 2 weeks until completing treatment.  Notify all of your previous sex partners of any positive results, as they may need to be treated.  Please use protection like condoms in future sexual encounters if you have any concerns about exposure to STI's.

## 2021-02-14 NOTE — ED Provider Notes (Signed)
[facility] EMERGENCY DEPARTMENT Provider Note   CSN: [id number] Arrival date & time: 02/14/21  9381     History Chief Complaint  Patient presents with  . multiple complaints    Glenda Mann is a 35 y.o. female w/ hx of diabetes, obesity, presenting to Ed with vaginal discharge and itching.  She reports dysuria for several days, a painful "pump" near her vagina for 2 days. Reports hx of genital herpes and chlamydia - concerned about exposure to STI with new partner.  + Vaginal discharge.   Hx of hidraadinitis, reports possible abscess near her pubis  Allergic to penicillins, bactrim, reglan  Has new PCP appointment in the upcoming month  HPI     Past Medical History:  Diagnosis Date  . BV (bacterial vaginosis)   . Chlamydia   . Gallstones   . Gestational diabetes   . Hidradenitis suppurativa   . Shingles     Patient Active Problem List   Diagnosis Date Noted  . 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 CHOLECYSTECTOMY    . INCISE AND DRAIN ABCESS       OB History    Gravida  2   Para  1   Term      Preterm      AB      Living        SAB      IAB      Ectopic      Multiple      Live Births              Family History  Problem Relation Age of Onset  . Migraines Mother   . Hypertension Maternal Grandmother   . Breast cancer Paternal Grandmother     Social History   Tobacco Use  . Smoking status: Current Every Day Smoker    Packs/day: 0.50    Types: Cigarettes  . Smokeless tobacco: Never Used  Vaping Use  . Vaping Use: Never used  Substance Use Topics  . Alcohol use: Yes    Comment: drinks on weekends  . Drug use: No    Home Medications Prior to Admission medications   Medication Sig Start Date End Date Taking? Authorizing Provider  doxycycline (VIBRAMYCIN) 100 MG capsule Take 1 capsule (100 mg total) by mouth 2 (two) times daily for 7 days. 02/14/21 02/21/21 Yes  Terald Sleeper, MD  fluconazole (DIFLUCAN) 200 MG tablet Take 1 tablet (200 mg total) by mouth once for 1 dose. 02/14/21 02/14/21 Yes Hina Gupta, Kermit Balo, MD  metroNIDAZOLE (FLAGYL) 500 MG tablet Take 1 tablet (500 mg total) by mouth 2 (two) times daily for 7 days. 02/14/21 02/21/21 Yes Lolly Glaus, Kermit Balo, MD  valACYclovir (VALTREX) 1000 MG tablet Take 1 tablet (1,000 mg total) by mouth 3 (three) times daily for 7 days. 02/14/21 02/21/21 Yes Jairus Tonne, Kermit Balo, MD  glipiZIDE (GLUCOTROL) 10 MG tablet Take 10 mg by mouth daily before breakfast.    [provider]  indomethacin (INDOCIN) 50 MG capsule Take 1 capsule (50 mg total) by mouth 2 (two) times daily with a meal. 08/23/20   Horton, Mayer Masker, MD  levonorgestrel (MIRENA) 20 MCG/24HR IUD by Intrauterine route.    [provider]  metFORMIN (GLUCOPHAGE) 1000 MG tablet Take 1,000 mg by mouth 2 (two) times daily with a meal.    [provider]  metroNIDAZOLE (FLAGYL) 500 MG tablet Take 1 tablet (500 mg total)  by mouth 2 (two) times daily. One po bid x 7 days 06/22/20   [name], MD  ondansetron (ZOFRAN ODT) 4 MG disintegrating tablet Take 1 tablet (4 mg total) by mouth every 8 (eight) hours as needed for nausea or vomiting. 12/21/20   Demetrios Loll T, PA-C  rosuvastatin (CRESTOR) 20 MG tablet Take 20 mg by mouth daily.    [provider]  Semaglutide (OZEMPIC, 1 MG/DOSE, [place]) Inject 1 mg/mL into the skin once a week.    [provider]    Allergies    Bactrim [sulfamethoxazole-trimethoprim], Penicillins, and Reglan [metoclopramide]  Review of Systems   Review of Systems  Constitutional: Negative for chills and fever.  Eyes: Negative for pain and visual disturbance.  Respiratory: Negative for cough and shortness of breath.   Cardiovascular: Negative for chest pain and palpitations.  Gastrointestinal: Negative for abdominal pain and vomiting.  Genitourinary: Positive for dysuria and vaginal  discharge.  Musculoskeletal: Negative for arthralgias and back pain.  Skin: Positive for rash and wound.  Neurological: Negative for syncope and headaches.  Psychiatric/Behavioral: Negative for agitation and confusion.  All other systems reviewed and are negative.   Physical Exam Updated Vital Signs BP (!) 155/107 (BP Location: Right Arm)   Pulse 88   Temp 98.4 F (36.9 C)   Resp 18   SpO2 98%   Physical Exam Constitutional:      General: She is not in acute distress.    Appearance: She is obese.  HENT:     Head: Normocephalic and atraumatic.  Eyes:     Conjunctiva/sclera: Conjunctivae normal.     Pupils: Pupils are equal, round, and reactive to light.  Cardiovascular:     Rate and Rhythm: Normal rate and regular rhythm.  Pulmonary:     Effort: Pulmonary effort is normal. No respiratory distress.  Genitourinary:    Comments: Gu exam performed with nurse chaperone present in room Small herpes simplex lesions on external labia  White thick vaginal discharge noted No cervical erythema or tenderness No significant malodoror Skin:    General: Skin is warm and dry.     Comments: Small ingrown hair follicle in pubis symphysis  Neurological:     General: No focal deficit present.     Mental Status: She is alert. Mental status is at baseline.  Psychiatric:        Mood and Affect: Mood normal.        Behavior: Behavior normal.     ED Results / Procedures / Treatments   Labs (all labs ordered are listed, but only abnormal results are displayed) Labs Reviewed  WET PREP, GENITAL - Abnormal; Notable for the following components:      Result Value   Clue Cells Wet Prep HPF POC PRESENT (*)    WBC, Wet Prep HPF POC MODERATE (*)    All other components within normal limits  URINALYSIS, ROUTINE W REFLEX MICROSCOPIC - Abnormal; Notable for the following components:   Specific Gravity, Urine >1.030 (*)    All other components within normal limits  PREGNANCY, URINE   GC/CHLAMYDIA PROBE AMP ([place]) NOT AT [facility]    EKG None  Radiology No results found.  Procedures Procedures   Medications Ordered in ED Medications  metroNIDAZOLE (FLAGYL) tablet 500 mg (500 mg Oral Given 02/14/21 0813)  doxycycline (VIBRA-TABS) tablet 100 mg (100 mg Oral Given 02/14/21 0813)  fluconazole (DIFLUCAN) tablet 150 mg (150 mg Oral Given 02/14/21 0813)    ED Course  I have reviewed the triage vital signs and the nursing notes.  Pertinent labs & imaging results that were available during my care of the patient were reviewed by me and considered in my medical decision making (see chart for details).  This patient presents to the Emergency Department with complaint of exposure or symptoms of sexually transmitted or pelvic infection.  Her exam is consistent with a hidradanitis lesion of the pubis, too small for I&D, as well as HSV outbreak of the vulva, and possible STI and yeast infection  I ordered, reviewed, and interpreted labs, including UA showing no clear evidence of infection I ordered medication Doxycycline for Chlamydia and skin infection coverage, Flagyl for BV noted on wet prep, Fluconazole empirically as she reports yeast infections on antibiotics.  She'll need a valtrex prescription as well.  We decided to defer treatment for GC given no clear sign of this infection and the multiple antibiotics being prescribed.  I counseled safer sex practices.  Based on the patient's clinical exam, vital signs, risk factors, and ED testing, I felt that the patient's overall risk of life-threatening emergency such as sepsis or torsion was low.  I discussed outpatient follow up with primary care provider, and advised follow up on their culture results, if sent.  I also advised sexual abstinence while awaiting test results, and encouraging all prior and current sexual partners to obtain testing and treatment if positive for STI on today's workup.   Return precautions were  discussed with the patient.  I felt the patient was clinically stable for discharge.    Final Clinical Impression(s) / ED Diagnoses Final diagnoses:  Vaginal discharge  Herpes simplex vulvovaginitis  Hidradenitis  BV (bacterial vaginosis)    Rx / DC Orders ED Discharge Orders         Ordered    metroNIDAZOLE (FLAGYL) 500 MG tablet  2 times daily        02/14/21 0757    doxycycline (VIBRAMYCIN) 100 MG capsule  2 times daily        02/14/21 0757    valACYclovir (VALTREX) 1000 MG tablet  3 times daily        02/14/21 0757    fluconazole (DIFLUCAN) 200 MG tablet   Once        02/14/21 0757           Terald Sleeper, MD 02/14/21 1731

## 2021-02-14 NOTE — ED Triage Notes (Addendum)
Reports lower abd pain over the weekend that has since resolved. Also states "bump" on her pannis/groin area. States burning with urination. Concerned for "bump" near urethra. Hx of herpes. Vaginal discharge.

## 2021-02-15 LAB — GC/CHLAMYDIA PROBE AMP (~~LOC~~) NOT AT ARMC
Chlamydia: NEGATIVE
Comment: NEGATIVE
Comment: NORMAL
Neisseria Gonorrhea: NEGATIVE

## 2021-03-23 ENCOUNTER — Emergency Department (HOSPITAL_BASED_OUTPATIENT_CLINIC_OR_DEPARTMENT_OTHER)
Admission: EM | Admit: 2021-03-23 | Discharge: 2021-03-23 | Disposition: A | Discharge status: Home / Self Care | Payer: No Typology Code available for payment source | Attending: Emergency Medicine | Admitting: Emergency Medicine

## 2021-03-23 ENCOUNTER — Encounter (HOSPITAL_BASED_OUTPATIENT_CLINIC_OR_DEPARTMENT_OTHER): Payer: Self-pay | Admitting: Emergency Medicine

## 2021-03-23 ENCOUNTER — Emergency Department (HOSPITAL_BASED_OUTPATIENT_CLINIC_OR_DEPARTMENT_OTHER): Payer: No Typology Code available for payment source

## 2021-03-23 ENCOUNTER — Other Ambulatory Visit: Payer: Self-pay

## 2021-03-23 DIAGNOSIS — B3731 Acute candidiasis of vulva and vagina: Secondary | ICD-10-CM

## 2021-03-23 DIAGNOSIS — B373 Candidiasis of vulva and vagina: Secondary | ICD-10-CM | POA: Diagnosis not present

## 2021-03-23 DIAGNOSIS — R112 Nausea with vomiting, unspecified: Secondary | ICD-10-CM | POA: Diagnosis not present

## 2021-03-23 DIAGNOSIS — Z7984 Long term (current) use of oral hypoglycemic drugs: Secondary | ICD-10-CM | POA: Insufficient documentation

## 2021-03-23 DIAGNOSIS — R140 Abdominal distension (gaseous): Secondary | ICD-10-CM | POA: Insufficient documentation

## 2021-03-23 DIAGNOSIS — N898 Other specified noninflammatory disorders of vagina: Secondary | ICD-10-CM | POA: Diagnosis present

## 2021-03-23 DIAGNOSIS — F17210 Nicotine dependence, cigarettes, uncomplicated: Secondary | ICD-10-CM | POA: Diagnosis not present

## 2021-03-23 LAB — URINALYSIS, ROUTINE W REFLEX MICROSCOPIC
Bilirubin Urine: NEGATIVE
Glucose, UA: NEGATIVE mg/dL
Hgb urine dipstick: NEGATIVE
Ketones, ur: NEGATIVE mg/dL
Nitrite: NEGATIVE
Protein, ur: NEGATIVE mg/dL
Specific Gravity, Urine: >1.03 — ABNORMAL HIGH (ref 1.005–1.030)
pH: 5.5 (ref 5.0–8.0)

## 2021-03-23 LAB — URINALYSIS, MICROSCOPIC (REFLEX)

## 2021-03-23 LAB — WET PREP, GENITAL
Clue Cells Wet Prep HPF POC: NONE SEEN
Sperm: NONE SEEN
Trich, Wet Prep: NONE SEEN

## 2021-03-23 LAB — PREGNANCY, URINE: Preg Test, Ur: NEGATIVE

## 2021-03-23 MED ORDER — FLUCONAZOLE 150 MG PO TABS
150.0000 mg | ORAL_TABLET | Freq: Once | ORAL | Status: AC
  Administered 2021-03-23: 150 mg via ORAL
  Filled 2021-03-23: qty 1

## 2021-03-23 NOTE — ED Provider Notes (Signed)
[facility] Provider Note: [name], MD, FACEP  CSN: [id number] MRN: [id number] ARRIVAL: 03/23/21 at 0518 ROOM: MH03/MH03   CHIEF COMPLAINT  Vaginal Discharge   HISTORY OF PRESENT ILLNESS  03/23/21 5:38 AM Glenda Mann is a 35 y.o. female who is felt "heaviness" in her abdomen associated with early satiety and occasional nausea and vomiting.  She has Zofran but has not taken any.  She rates her discomfort as an 8 out of 10.  She has chronic diarrhea status post cholecystectomy that has not acutely changed.  Her principal concern is a vaginal discharge that began yesterday.  She states it is white, clumpy and has an abnormal odor.  She believes to be a yeast infection.  She is not having any vaginal bleeding.  She has a Mirena which she states is several months overdue for replacement.   Past Medical History:  Diagnosis Date  . BV (bacterial vaginosis)   . Chlamydia   . Gallstones   . Gestational diabetes   . Hidradenitis suppurativa   . Shingles     Past Surgical History:  Procedure Laterality Date  . CHOLECYSTECTOMY    . INCISE AND DRAIN ABCESS      Family History  Problem Relation Age of Onset  . Migraines Mother   . Hypertension Maternal Grandmother   . Breast cancer Paternal Grandmother     Social History   Tobacco Use  . Smoking status: Current Every Day Smoker    Packs/day: 0.50    Types: Cigarettes  . Smokeless tobacco: Never Used  Vaping Use  . Vaping Use: Never used  Substance Use Topics  . Alcohol use: Yes    Comment: drinks on weekends  . Drug use: No    Prior to Admission medications   Medication Sig Start Date End Date Taking? Authorizing Provider  glipiZIDE (GLUCOTROL) 10 MG tablet Take 10 mg by mouth daily before breakfast.    [provider]  levonorgestrel (MIRENA) 20 MCG/24HR IUD by Intrauterine route.    [provider]  metFORMIN (GLUCOPHAGE) 1000 MG tablet Take 1,000 mg by mouth 2 (two) times  daily with a meal.    [provider]  ondansetron (ZOFRAN ODT) 4 MG disintegrating tablet Take 1 tablet (4 mg total) by mouth every 8 (eight) hours as needed for nausea or vomiting. 12/21/20   Demetrios Loll T, PA-C  rosuvastatin (CRESTOR) 20 MG tablet Take 20 mg by mouth daily.    [provider]  Semaglutide (OZEMPIC, 1 MG/DOSE, [place]) Inject 1 mg/mL into the skin once a week.    [provider]    Allergies Bactrim [sulfamethoxazole-trimethoprim], Penicillins, Reglan [metoclopramide], and Methylphenidate   REVIEW OF SYSTEMS  Negative except as noted here or in the History of Present Illness.   PHYSICAL EXAMINATION  Initial Vital Signs Blood pressure (!) 157/108, pulse 92, temperature 98.6 F (37 C), temperature source Oral, resp. rate 17, height 5\' 5"  (1.651 m), weight 107.5 kg, SpO2 100 %.  Examination General: Well-developed, well-nourished female in no acute distress; appearance consistent with age of record HENT: normocephalic; atraumatic Eyes: pupils equal, round and reactive to light; extraocular muscles intact Neck: supple Heart: regular rate and rhythm Lungs: clear to auscultation bilaterally Abdomen: soft; nondistended; nontender; no masses or hepatosplenomegaly; bowel sounds present GU: Mild vulvovaginal inflammation with white discharge; no vaginal bleeding; no cervical motion tenderness; no adnexal tenderness Extremities: No deformity; full range of motion; pulses normal Neurologic: Awake, alert and oriented; motor function  intact in all extremities and symmetric; no facial droop Skin: Warm and dry Psychiatric: Normal mood and affect   RESULTS  Summary of this visit's results, reviewed and interpreted by myself:   EKG Interpretation  Date/Time:    Ventricular Rate:    PR Interval:    QRS Duration:   QT Interval:    QTC Calculation:   R Axis:     Text Interpretation:        Laboratory Studies: Results for orders placed or  performed during the hospital encounter of 03/23/21 (from the past 24 hour(s))  Urinalysis, Routine w reflex microscopic     Status: Abnormal   Collection Time: 03/23/21  5:39 AM  Result Value Ref Range   Color, Urine YELLOW YELLOW   APPearance HAZY (A) CLEAR   Specific Gravity, Urine >1.030 (H) 1.005 - 1.030   pH 5.5 5.0 - 8.0   Glucose, UA NEGATIVE NEGATIVE mg/dL   Hgb urine dipstick NEGATIVE NEGATIVE   Bilirubin Urine NEGATIVE NEGATIVE   Ketones, ur NEGATIVE NEGATIVE mg/dL   Protein, ur NEGATIVE NEGATIVE mg/dL   Nitrite NEGATIVE NEGATIVE   Leukocytes,Ua MODERATE (A) NEGATIVE  Pregnancy, urine     Status: None   Collection Time: 03/23/21  5:39 AM  Result Value Ref Range   Preg Test, Ur NEGATIVE NEGATIVE  Urinalysis, Microscopic (reflex)     Status: Abnormal   Collection Time: 03/23/21  5:39 AM  Result Value Ref Range   RBC / HPF 0-5 0 - 5 RBC/hpf   WBC, UA 6-10 0 - 5 WBC/hpf   Bacteria, UA RARE (A) NONE SEEN   Squamous Epithelial / LPF 0-5 0 - 5  Wet prep, genital     Status: Abnormal   Collection Time: 03/23/21  5:55 AM   Specimen: Genital  Result Value Ref Range   Yeast Wet Prep HPF POC PRESENT (A) NONE SEEN   Trich, Wet Prep NONE SEEN NONE SEEN   Clue Cells Wet Prep HPF POC NONE SEEN NONE SEEN   WBC, Wet Prep HPF POC MANY (A) NONE SEEN   Sperm NONE SEEN    Imaging Studies: DG Abdomen 1 View  Result Date: 03/23/2021 CLINICAL DATA:  Bloating, early satiety EXAM: ABDOMEN - 1 VIEW COMPARISON:  05/11/2016 FINDINGS: Normal abdominal gas pattern. No gross free intraperitoneal gas. Cholecystectomy clips seen in the right upper quadrant. No organomegaly. Multiple phleboliths noted within the pelvis. Intrauterine device within the mid pelvis again noted. No acute bone abnormality. IMPRESSION: Normal abdominal gas pattern. Electronically Signed   By: Helyn Numbers MD   On: 03/23/2021 06:16    ED COURSE and MDM  Nursing notes, initial and subsequent vitals signs, including pulse  oximetry, reviewed and interpreted by myself.  Vitals:   03/23/21 0530 03/23/21 0531  BP: (!) 157/108   Pulse: 92   Resp: 17   Temp: 98.6 F (37 C)   TempSrc: Oral   SpO2: 100%   Weight:  107.5 kg  Height:  5\' 5"  (1.651 m)   Medications  fluconazole (DIFLUCAN) tablet 150 mg (has no administration in time range)   6:21 AM The cause for the patient's sensation of bloating is unclear.  Her bowel gas pattern is normal.  She is nondistended, nontender on exam with no palpable masses or organomegaly.  She was advised she should take the Zofran as needed for nausea and she may wish to try simethicone to help with gas and bloating.  We will treat for vulvovaginal candidiasis  with Diflucan.  PROCEDURES  Procedures   ED DIAGNOSES     ICD-10-CM   1. Vaginal yeast infection  B37.3   2. Abdominal bloating  R14.0        Ugonna Keirsey, Jonny Ruiz, MD 03/23/21 [phone]

## 2021-03-23 NOTE — ED Triage Notes (Signed)
Pt reports generalized abd "heaviness" x 2 weeks; pt also reports vaginal discharge with an odor, reports "being prone" to BV; pt reports occasional nausea

## 2021-03-24 LAB — GC/CHLAMYDIA PROBE AMP (~~LOC~~) NOT AT ARMC
Chlamydia: NEGATIVE
Comment: NEGATIVE
Comment: NORMAL
Neisseria Gonorrhea: NEGATIVE

## 2021-04-11 ENCOUNTER — Encounter (HOSPITAL_BASED_OUTPATIENT_CLINIC_OR_DEPARTMENT_OTHER): Payer: Self-pay | Admitting: Emergency Medicine

## 2021-04-11 ENCOUNTER — Emergency Department (HOSPITAL_BASED_OUTPATIENT_CLINIC_OR_DEPARTMENT_OTHER)
Admission: EM | Admit: 2021-04-11 | Discharge: 2021-04-11 | Disposition: A | Discharge status: Home / Self Care | Payer: No Typology Code available for payment source | Attending: Emergency Medicine | Admitting: Emergency Medicine

## 2021-04-11 ENCOUNTER — Other Ambulatory Visit: Payer: Self-pay

## 2021-04-11 DIAGNOSIS — Z7984 Long term (current) use of oral hypoglycemic drugs: Secondary | ICD-10-CM | POA: Insufficient documentation

## 2021-04-11 DIAGNOSIS — F17210 Nicotine dependence, cigarettes, uncomplicated: Secondary | ICD-10-CM | POA: Insufficient documentation

## 2021-04-11 DIAGNOSIS — Z8632 Personal history of gestational diabetes: Secondary | ICD-10-CM | POA: Diagnosis not present

## 2021-04-11 DIAGNOSIS — A6009 Herpesviral infection of other urogenital tract: Secondary | ICD-10-CM

## 2021-04-11 DIAGNOSIS — N898 Other specified noninflammatory disorders of vagina: Secondary | ICD-10-CM

## 2021-04-11 DIAGNOSIS — R102 Pelvic and perineal pain: Secondary | ICD-10-CM | POA: Diagnosis present

## 2021-04-11 DIAGNOSIS — A6000 Herpesviral infection of urogenital system, unspecified: Secondary | ICD-10-CM | POA: Diagnosis not present

## 2021-04-11 HISTORY — DX: Herpesviral infection, unspecified: B00.9

## 2021-04-11 LAB — WET PREP, GENITAL
Sperm: NONE SEEN
Trich, Wet Prep: NONE SEEN
Yeast Wet Prep HPF POC: NONE SEEN

## 2021-04-11 MED ORDER — VALACYCLOVIR HCL 500 MG PO TABS
1000.0000 mg | ORAL_TABLET | Freq: Once | ORAL | Status: AC
  Administered 2021-04-11: 1000 mg
  Filled 2021-04-11: qty 2

## 2021-04-11 MED ORDER — FLUCONAZOLE 150 MG PO TABS: ORAL_TABLET | ORAL | 1 refills | Status: DC

## 2021-04-11 MED ORDER — VALACYCLOVIR HCL 1 G PO TABS: 1000.0000 mg | ORAL_TABLET | Freq: Three times a day (TID) | ORAL | 0 refills | Status: DC

## 2021-04-11 MED ORDER — FLUCONAZOLE 150 MG PO TABS
150.0000 mg | ORAL_TABLET | Freq: Once | ORAL | Status: AC
  Administered 2021-04-11: 150 mg via ORAL
  Filled 2021-04-11: qty 1

## 2021-04-11 NOTE — ED Triage Notes (Signed)
Reports vaginal pain and "milky white" discharge with odor and pain on her buttocks. She states it "kinda feels like an outbreak", but usually she has "pain everywhere" during those.

## 2021-04-11 NOTE — ED Provider Notes (Signed)
[facility] Provider Note: [name], MD, FACEP  CSN: [id number] MRN: [id number] ARRIVAL: 04/11/21 at 0516 ROOM: MH10/MH10   CHIEF COMPLAINT  Vaginal Pain   HISTORY OF PRESENT ILLNESS  04/11/21 5:42 AM Glenda Mann is a 35 y.o. female with a history of herpes genitalis and vulvovaginal candidiasis.  She is here with a 1 day history of vulvovaginal pain consistent with previous herpes outbreaks.  She rates her pain as a 7 out of 10.  She has several vesicular lesions of the vulva as well.  She also has noticed a white discharge.  Unlike some outbreak she does not have generalized pain and malaise.   Past Medical History:  Diagnosis Date  . BV (bacterial vaginosis)   . Chlamydia   . Gallstones   . Gestational diabetes   . Hidradenitis suppurativa   . HSV (herpes simplex virus) infection   . Shingles     Past Surgical History:  Procedure Laterality Date  . CHOLECYSTECTOMY    . INCISE AND DRAIN ABCESS      Family History  Problem Relation Age of Onset  . Migraines Mother   . Hypertension Maternal Grandmother   . Breast cancer Paternal Grandmother     Social History   Tobacco Use  . Smoking status: Current Every Day Smoker    Packs/day: 0.50    Types: Cigarettes  . Smokeless tobacco: Never Used  Vaping Use  . Vaping Use: Never used  Substance Use Topics  . Alcohol use: Yes    Comment: drinks on weekends  . Drug use: No    Prior to Admission medications   Medication Sig Start Date End Date Taking? Authorizing Provider  valACYclovir (VALTREX) 1000 MG tablet Take 1 tablet (1,000 mg total) by mouth 3 (three) times daily. 04/11/21  Yes Jakhai Fant, MD  glipiZIDE (GLUCOTROL) 10 MG tablet Take 10 mg by mouth daily before breakfast.    [provider]  levonorgestrel (MIRENA) 20 MCG/24HR IUD by Intrauterine route.    [provider]  metFORMIN (GLUCOPHAGE) 1000 MG tablet Take 1,000 mg by mouth 2 (two) times daily with a meal.     [provider]  ondansetron (ZOFRAN ODT) 4 MG disintegrating tablet Take 1 tablet (4 mg total) by mouth every 8 (eight) hours as needed for nausea or vomiting. 12/21/20   Demetrios Loll T, PA-C  rosuvastatin (CRESTOR) 20 MG tablet Take 20 mg by mouth daily.    [provider]  Semaglutide (OZEMPIC, 1 MG/DOSE, [place]) Inject 1 mg/mL into the skin once a week.    [provider]    Allergies Bactrim [sulfamethoxazole-trimethoprim], Penicillins, Reglan [metoclopramide], and Methylphenidate   REVIEW OF SYSTEMS  Negative except as noted here or in the History of Present Illness.   PHYSICAL EXAMINATION  Initial Vital Signs Height 5\' 5"  (1.651 m), weight 107.5 kg, last menstrual period 04/04/2021.  Examination General: Well-developed, well-nourished female in no acute distress; appearance consistent with age of record HENT: normocephalic; atraumatic Eyes: Normal appearance Neck: supple Heart: regular rate and rhythm Lungs: clear to auscultation bilaterally Abdomen: soft; nondistended; nontender; bowel sounds present GU: A few mucosal ulcerations of the vulva; white vaginal discharge; speculum exam not done due to discomfort Extremities: No deformity; full range of motion; pulses normal Neurologic: Awake, alert and oriented; motor function intact in all extremities and symmetric; no facial droop Skin: Warm and dry Psychiatric: Normal mood and affect   RESULTS  Summary of this visit's results, reviewed and  interpreted by myself:   EKG Interpretation  Date/Time:    Ventricular Rate:    PR Interval:    QRS Duration:   QT Interval:    QTC Calculation:   R Axis:     Text Interpretation:        Laboratory Studies: Results for orders placed or performed during the hospital encounter of 04/11/21 (from the past 24 hour(s))  Wet prep, genital     Status: Abnormal   Collection Time: 04/11/21  5:59 AM   Specimen: Genital  Result Value Ref Range   Yeast Wet  Prep HPF POC NONE SEEN NONE SEEN   Trich, Wet Prep NONE SEEN NONE SEEN   Clue Cells Wet Prep HPF POC PRESENT (A) NONE SEEN   WBC, Wet Prep HPF POC FEW (A) NONE SEEN   Sperm NONE SEEN    Imaging Studies: No results found.  ED COURSE and MDM  Nursing notes, initial and subsequent vitals signs, including pulse oximetry, reviewed and interpreted by myself.  Vitals:   04/11/21 0538 04/11/21 0605  BP:  (!) 143/95  Pulse:  96  Resp:  16  Temp:  98.1 F (36.7 C)  TempSrc:  Oral  SpO2:  96%  Weight: 107.5 kg   Height: 5\' 5"  (1.651 m)    Medications  valACYclovir (VALTREX) tablet 1,000 mg (has no administration in time range)  fluconazole (DIFLUCAN) tablet 150 mg (has no administration in time range)    Vesicular lesions of vulva consistent with herpes simplex infection.  Will start on Valtrex.  We will also treat with a dose of Diflucan as yeast does not always show up on wet preps.  PROCEDURES  Procedures   ED DIAGNOSES     ICD-10-CM   1. Herpes simplex of female genitalia  A60.09   2. Vaginal discharge  N89.8        , MD 04/11/21 [phone]

## 2021-04-26 ENCOUNTER — Other Ambulatory Visit: Payer: Self-pay

## 2021-04-26 ENCOUNTER — Emergency Department (HOSPITAL_BASED_OUTPATIENT_CLINIC_OR_DEPARTMENT_OTHER)
Admission: EM | Admit: 2021-04-26 | Discharge: 2021-04-26 | Disposition: A | Discharge status: Home / Self Care | Payer: No Typology Code available for payment source | Attending: Emergency Medicine | Admitting: Emergency Medicine

## 2021-04-26 ENCOUNTER — Encounter (HOSPITAL_BASED_OUTPATIENT_CLINIC_OR_DEPARTMENT_OTHER): Payer: Self-pay

## 2021-04-26 DIAGNOSIS — Z23 Encounter for immunization: Secondary | ICD-10-CM | POA: Diagnosis not present

## 2021-04-26 DIAGNOSIS — L0291 Cutaneous abscess, unspecified: Secondary | ICD-10-CM

## 2021-04-26 DIAGNOSIS — L02214 Cutaneous abscess of groin: Secondary | ICD-10-CM | POA: Diagnosis not present

## 2021-04-26 DIAGNOSIS — F17210 Nicotine dependence, cigarettes, uncomplicated: Secondary | ICD-10-CM | POA: Insufficient documentation

## 2021-04-26 MED ORDER — DOXYCYCLINE HYCLATE 100 MG PO CAPS: 100.0000 mg | ORAL_CAPSULE | Freq: Two times a day (BID) | ORAL | 0 refills | Status: AC

## 2021-04-26 MED ORDER — LIDOCAINE-EPINEPHRINE (PF) 2 %-1:200000 IJ SOLN
20.0000 mL | Freq: Once | INTRAMUSCULAR | Status: AC
  Administered 2021-04-26: 20 mL
  Filled 2021-04-26: qty 20

## 2021-04-26 MED ORDER — ACETAMINOPHEN 500 MG PO TABS
1000.0000 mg | ORAL_TABLET | Freq: Once | ORAL | Status: AC
  Administered 2021-04-26: 1000 mg via ORAL
  Filled 2021-04-26: qty 2

## 2021-04-26 MED ORDER — TETANUS-DIPHTH-ACELL PERTUSSIS 5-2.5-18.5 LF-MCG/0.5 IM SUSY
0.5000 mL | PREFILLED_SYRINGE | Freq: Once | INTRAMUSCULAR | Status: AC
  Administered 2021-04-26: 0.5 mL via INTRAMUSCULAR
  Filled 2021-04-26: qty 0.5

## 2021-04-26 NOTE — ED Triage Notes (Signed)
Possible abscess to R upper groin area x 3 days. Pt c/o pain, hx of same with I&D.

## 2021-04-26 NOTE — Discharge Instructions (Signed)
You came to the emergency department to have your abscess assessed.  We performed an incision and drainage today.  There is packing in the wound.  Please leave these packing in place.  Packing needs to be removed and 3 days.  Please return to urgent care or emergency department to have packing removed.  If packing falls out prior to removal do not reinsert it.  Please do not submerge the area under water.  Please perform warm compresses 2-3 times daily to help with drainage.  I have given you prescription for antibiotics.  Please take this medication as prescribed.  If you develop signs or symptoms of a yeast infection you may start with a topical over-the-counter antifungal medication to treat your symptoms.  You may have diarrhea from the antibiotics.  It is very important that you continue to take the antibiotics even if you get diarrhea unless a medical professional tells you that you may stop taking them.  If you stop too early the bacteria you are being treated for will become stronger and you may need different, more powerful antibiotics that have more side effects and worsening diarrhea.  Please stay well hydrated and consider probiotics as they may decrease the severity of your diarrhea.  Please be aware that if you take any hormonal contraception (birth control pills, nexplanon, the ring, etc) that your birth control will not work while you are taking antibiotics and you need to use back up protection as directed on the birth control medication information insert.   Get help right away if you: Have severe pain. See red streaks on your skin spreading away from the abscess.

## 2021-04-26 NOTE — ED Provider Notes (Addendum)
[facility] EMERGENCY DEPARTMENT Provider Note   CSN: [id number] Arrival date & time: 04/26/21  3664     History Chief Complaint  Patient presents with  . Abscess    Glenda Mann is a 35 y.o. female is with a chief complaint of abscess to groin.  Patient reports abscess first developed 2 days prior.  Over this time abscess has grown in size.  Patient endorses associated pain with abscess.  Patient rates pain 10/seven-point.  Pain is worse with movement of the area or touch.  No deviating factors.  Patient denies any surrounding erythema, purulent drainage.  Patient reports that she has had multiple abscesses in this area before.  Most recently patient had abscess 2 weeks prior that spontaneously drained on its own.  She denies any fevers, chills, dysuria, hematuria, vaginal pain, vaginal bleeding, vaginal discharge.  LMP 5/9.  HPI     Past Medical History:  Diagnosis Date  . BV (bacterial vaginosis)   . Chlamydia   . Gallstones   . Gestational diabetes   . Hidradenitis suppurativa   . HSV (herpes simplex virus) infection   . Shingles     Patient Active Problem List   Diagnosis Date Noted  . 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 CHOLECYSTECTOMY    . INCISE AND DRAIN ABCESS       OB History    Gravida  2   Para  1   Term      Preterm      AB      Living        SAB      IAB      Ectopic      Multiple      Live Births              Family History  Problem Relation Age of Onset  . Migraines Mother   . Hypertension Maternal Grandmother   . Breast cancer Paternal Grandmother     Social History   Tobacco Use  . Smoking status: Current Every Day Smoker    Packs/day: 0.50    Types: Cigarettes  . Smokeless tobacco: Never Used  Vaping Use  . Vaping Use: Never used  Substance Use Topics  . Alcohol use: Yes    Comment: drinks on weekends  . Drug use: No    Home  Medications Prior to Admission medications   Medication Sig Start Date End Date Taking? Authorizing Provider  fluconazole (DIFLUCAN) 150 MG tablet Take as needed for vaginal yeast infection. 04/11/21  Yes Molpus, John, MD  glipiZIDE (GLUCOTROL) 10 MG tablet Take 10 mg by mouth daily before breakfast.   Yes [provider]  levonorgestrel (MIRENA) 20 MCG/24HR IUD by Intrauterine route.   Yes [provider]  metFORMIN (GLUCOPHAGE) 1000 MG tablet Take 1,000 mg by mouth 2 (two) times daily with a meal.   Yes [provider]  Semaglutide (OZEMPIC, 1 MG/DOSE, [place]) Inject 1 mg/mL into the skin once a week.   Yes [provider]  valACYclovir (VALTREX) 1000 MG tablet Take 1 tablet (1,000 mg total) by mouth 3 (three) times daily. 04/11/21  Yes Molpus, John, MD  ondansetron (ZOFRAN ODT) 4 MG disintegrating tablet Take 1 tablet (4 mg total) by mouth every 8 (eight) hours as needed for nausea or vomiting. 12/21/20   Demetrios Loll T, PA-C  rosuvastatin (CRESTOR) 20 MG tablet Take 20 mg by mouth daily.  [provider]    Allergies    Bactrim [sulfamethoxazole-trimethoprim], Penicillins, Reglan [metoclopramide], and Methylphenidate  Review of Systems   Review of Systems  Constitutional: Negative for chills and fever.  Genitourinary: Negative for difficulty urinating, dysuria, frequency, hematuria, vaginal bleeding, vaginal discharge and vaginal pain.  Skin: Negative for color change and rash.    Physical Exam Updated Vital Signs BP (!) 141/92 (BP Location: Right Arm)   Pulse (!) 105   Temp 98.2 F (36.8 C) (Oral)   Resp 20   Ht 5\' 5"  (1.651 m)   Wt 107.5 kg   LMP 04/04/2021   SpO2 98%   BMI 39.44 kg/m   Physical Exam Vitals and nursing note reviewed. Exam conducted with a chaperone present (Female RN present).  Constitutional:      General: She is not in acute distress.    Appearance: She is not ill-appearing, toxic-appearing or diaphoretic.   HENT:     Head: Normocephalic.  Eyes:     General: No scleral icterus.       Right eye: No discharge.        Left eye: No discharge.  Cardiovascular:     Rate and Rhythm: Normal rate.  Pulmonary:     Effort: Pulmonary effort is normal.  Abdominal:       Comments: Patient has 1 cm abscess to right suprapubic area, surrounding induration.  Tenderness to palpation.  No surrounding erythema or rash.  No purulent discharge.  Skin:    General: Skin is warm and dry.  Neurological:     General: No focal deficit present.     Mental Status: She is alert.  Psychiatric:        Behavior: Behavior is cooperative.     ED Results / Procedures / Treatments   Labs (all labs ordered are listed, but only abnormal results are displayed) Labs Reviewed - No data to display  EKG None  Radiology No results found.  Procedures .07/09/2022Incision and Drainage  Date/Time: 04/26/2021 12:24 PM Performed by: 04/28/2021, PA-C Authorized by: Haskel Schroeder, PA-C   Consent:    Consent obtained:  Verbal   Consent given by:  Patient   Risks discussed:  Bleeding, incomplete drainage, pain, damage to other organs and infection   Alternatives discussed:  No treatment and delayed treatment Universal protocol:    Procedure explained and questions answered to patient or proxy's satisfaction: yes     Site/side marked: yes     Immediately prior to procedure, a time out was called: yes     Patient identity confirmed:  Verbally with patient and arm band Location:    Type:  Abscess Pre-procedure details:    Skin preparation:  Povidone-iodine Anesthesia:    Anesthesia method:  Local infiltration   Local anesthetic:  Lidocaine 2% WITH epi Procedure type:    Complexity:  Complex Procedure details:    Incision types:  Cruciate   Incision depth:  Subcutaneous   Wound management:  Probed and deloculated, irrigated with saline and extensive cleaning   Drainage:  Purulent   Drainage amount:   Moderate   Packing materials:  1/4 in gauze   Amount 1/4":  3cm Post-procedure details:    Procedure completion:  Tolerated well, no immediate complications     Medications Ordered in ED Medications - No data to display  ED Course  I have reviewed the triage vital signs and the nursing notes.  Pertinent labs & imaging results that were available during  my care of the patient were reviewed by me and considered in my medical decision making (see chart for details).    MDM Rules/Calculators/A&P                          Alert 35 year old female no acute distress, nontoxic-appearing.  Patient presents with chief plaint of abscess to right groin.  She has no systemic symptoms.  Physical exam patient has 1 cm abscess to suprapubic area, surrounding induration.  No surrounding erythema or rash.  No drainage.  I&D performed as above.  We will give patient 7-day course of doxycycline as she is allergic to penicillin and sulfa medication.  Patient to return in 2 to 3 days to have packing removed and wound rechecked.  Patient given strict return precautions.  Patient expressed understanding of all instructions and is agreeable with this plan.  Final Clinical Impression(s) / ED Diagnoses Final diagnoses:  Abscess    Rx / DC Orders ED Discharge Orders         Ordered    doxycycline (VIBRAMYCIN) 100 MG capsule  2 times daily        04/26/21 1229           Haskel Schroeder, PA-C 04/26/21 1227    Haskel Schroeder, PA-C 04/26/21 1231    Tilden Fossa, MD 04/29/21 1006

## 2021-06-30 ENCOUNTER — Emergency Department (HOSPITAL_BASED_OUTPATIENT_CLINIC_OR_DEPARTMENT_OTHER)
Admission: EM | Admit: 2021-06-30 | Discharge: 2021-06-30 | Disposition: A | Discharge status: Home / Self Care | Payer: No Typology Code available for payment source | Attending: Emergency Medicine | Admitting: Emergency Medicine

## 2021-06-30 ENCOUNTER — Other Ambulatory Visit: Payer: Self-pay

## 2021-06-30 ENCOUNTER — Encounter (HOSPITAL_BASED_OUTPATIENT_CLINIC_OR_DEPARTMENT_OTHER): Payer: Self-pay | Admitting: *Deleted

## 2021-06-30 DIAGNOSIS — F17210 Nicotine dependence, cigarettes, uncomplicated: Secondary | ICD-10-CM | POA: Insufficient documentation

## 2021-06-30 DIAGNOSIS — R2231 Localized swelling, mass and lump, right upper limb: Secondary | ICD-10-CM | POA: Diagnosis present

## 2021-06-30 DIAGNOSIS — L02411 Cutaneous abscess of right axilla: Secondary | ICD-10-CM | POA: Diagnosis not present

## 2021-06-30 DIAGNOSIS — L0291 Cutaneous abscess, unspecified: Secondary | ICD-10-CM

## 2021-06-30 MED ORDER — LIDOCAINE-EPINEPHRINE (PF) 2 %-1:200000 IJ SOLN
10.0000 mL | Freq: Once | INTRAMUSCULAR | Status: AC
  Administered 2021-06-30: 10 mL
  Filled 2021-06-30: qty 20

## 2021-06-30 MED ORDER — LIDOCAINE-EPINEPHRINE-TETRACAINE (LET) TOPICAL GEL
3.0000 mL | Freq: Once | TOPICAL | Status: AC
  Administered 2021-06-30: 3 mL via TOPICAL
  Filled 2021-06-30: qty 3

## 2021-06-30 NOTE — Discharge Instructions (Addendum)
You came to the emergency department today to have your abscess evaluated.  An incision and drainage was performed.  Packing was placed in your wound.  Please return to the emergency department or go to an urgent care in 3 days to have the packing removed and wound rechecked.  If packing falls out prior do not reinsert.  Please perform warm compresses at least 4 times daily.  Please read attached paperwork on incision and drainage care.   Get help right away if: You have severe pain or bleeding. You cannot eat or drink without vomiting. You have decreased urine output. You become short of breath. You have chest pain. You cough up blood. The affected area becomes numb or starts to tingle.

## 2021-06-30 NOTE — ED Provider Notes (Signed)
[facility] EMERGENCY DEPARTMENT Provider Note   CSN: [id number] Arrival date & time: 06/30/21  1114     History Chief Complaint  Patient presents with   Abscess    Glenda Mann is a 35 y.o. female with a history of hidradenitis suppurativa.  Presents to the emergency department with a chief complaint of abscess to right axilla.  Patient noticed abscess 2 days prior.  Swelling has increased over this time.  Patient complains of pain to affected area.  Patient rates pain 9/10 on the pain scale.  Pain is worse with touch.  No relief with over-the-counter pain medication or warm compress.  No purulent drainage, fever, chills, nausea, vomiting.   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    Past Surgical History:  Procedure Laterality Date   CHOLECYSTECTOMY     INCISE AND DRAIN ABCESS       OB History     Gravida  2   Para  1   Term      Preterm      AB      Living         SAB      IAB      Ectopic      Multiple      Live Births              Family History  Problem Relation Age of Onset   Migraines Mother    Hypertension Maternal Grandmother    Breast cancer Paternal Grandmother     Social History   Tobacco Use   Smoking status: Every Day    Packs/day: 0.50    Types: Cigarettes   Smokeless tobacco: Never  Vaping Use   Vaping Use: Never used  Substance Use Topics   Alcohol use: Yes    Comment: drinks on weekends   Drug use: No    Home Medications Prior to Admission medications   Medication Sig Start Date End Date Taking? Authorizing Provider  dapagliflozin propanediol (FARXIGA) 5 MG TABS tablet Take 1  tablet by mouth daily. 06/10/21  Yes [provider]  glipiZIDE (GLUCOTROL) 10 MG tablet Take 10 mg by mouth daily before breakfast.   Yes [provider]  metFORMIN (GLUCOPHAGE) 1000 MG tablet Take 1,000 mg by mouth 2 (two) times daily with a meal.   Yes [provider]  Semaglutide (OZEMPIC, 1 MG/DOSE, [place]) Inject 1 mg/mL into the skin once a week.   Yes [provider]  fluconazole (DIFLUCAN) 150 MG tablet Take as needed for vaginal yeast infection. 04/11/21   Molpus, Jonny Ruiz, MD  levonorgestrel (MIRENA) 20 MCG/24HR IUD by Intrauterine route.    [provider]  ondansetron (ZOFRAN ODT) 4 MG disintegrating tablet Take 1 tablet (4 mg total) by mouth every 8 (eight) hours as needed for nausea or vomiting. 12/21/20   Demetrios Loll T, PA-C  rosuvastatin (CRESTOR) 20 MG tablet Take 20 mg by mouth daily.    [provider]  valACYclovir (VALTREX) 1000 MG tablet Take 1 tablet (1,000 mg total) by mouth 3 (three) times daily. 04/11/21  Molpus, John, MD    Allergies    Bactrim [sulfamethoxazole-trimethoprim], Penicillins, Reglan [metoclopramide], and Methylphenidate  Review of Systems   Review of Systems  Constitutional:  Negative for chills and fever.  Gastrointestinal:  Negative for nausea and vomiting.  Skin:  Positive for wound. Negative for color change, pallor and rash.  Neurological:  Negative for weakness and numbness.  Psychiatric/Behavioral:  Negative for confusion.    Physical Exam Updated Vital Signs BP (!) 139/92 (BP Location: Left Arm)   Pulse 94   Temp 98.4 F (36.9 C) (Oral)   Resp 18   Ht 5\' 5"  (1.651 m)   Wt 107.5 kg   SpO2 100%   BMI 39.44 kg/m   Physical Exam Vitals and nursing note reviewed.  Constitutional:      General: She is not in acute distress.    Appearance: She is not ill-appearing, toxic-appearing or diaphoretic.  HENT:     Head: Normocephalic.  Eyes:     General: No scleral icterus.       Right  eye: No discharge.        Left eye: No discharge.  Cardiovascular:     Rate and Rhythm: Normal rate.  Pulmonary:     Effort: Pulmonary effort is normal.  Musculoskeletal:     Comments: 3 cm x 2 cm area of fluctuance noted to right axilla, surrounding induration.  Minimal erythema.  Tender to palpation.  No purulent discharge noted.  Skin:    General: Skin is warm and dry.  Neurological:     General: No focal deficit present.     Mental Status: She is alert.  Psychiatric:        Behavior: Behavior is cooperative.    ED Results / Procedures / Treatments   Labs (all labs ordered are listed, but only abnormal results are displayed) Labs Reviewed - No data to display  EKG None  Radiology No results found.  Procedures . Incision and Drainage  Date/Time: 06/30/2021 1:33 PM Performed by: 08/30/2021, PA-C Authorized by: Haskel Schroeder, PA-C   Consent:    Consent obtained:  Verbal   Consent given by:  Patient   Risks discussed:  Bleeding, incomplete drainage, pain and damage to other organs   Alternatives discussed:  No treatment Universal protocol:    Procedure explained and questions answered to patient or proxy's satisfaction: yes     Patient identity confirmed:  Verbally with patient Location:    Type:  Abscess   Size:  3x2cm   Location:  Upper extremity   Upper extremity location:  Arm   Arm location:  R upper arm Pre-procedure details:    Skin preparation:  Povidone-iodine Anesthesia:    Anesthesia method:  Local infiltration and topical application   Topical anesthetic:  LET   Local anesthetic:  Lidocaine 2% WITH epi Procedure type:    Complexity:  Complex Procedure details:    Incision types:  Cruciate   Incision depth:  Subcutaneous   Wound management:  Probed and deloculated, irrigated with saline and extensive cleaning   Drainage:  Purulent   Drainage amount:  Moderate   Packing materials:  1/4 in gauze Post-procedure details:     Procedure completion:  Tolerated well, no immediate complications   Medications Ordered in ED Medications - No data to display  ED Course  I have reviewed the triage vital signs and the nursing notes.  Pertinent labs & imaging results that were available during my care of the patient  were reviewed by me and considered in my medical decision making (see chart for details).    MDM Rules/Calculators/A&P                           Alert 35 year old female no acute distress, nontoxic-appearing.  Presents with chief complaint of abscess to right axilla.  Patient has history of hidradenitis suppurativa and previous abscess to axilla.  Patient denies seeing dermatologist for her hidradenitis suppurativa.  Patient denies any systemic symptoms.  3 cm x 2 cm area of fluctuance noted to right axilla, surrounding induration.  Minimal erythema.  Tender to palpation.  No purulent discharge noted.  Low suspicion for cellulitis has minimal erythema and no warmth or streaking present.  Incision and drainage performed as noted above.  Patient will return to primary care provider, urgent care, or emergency department in 3 days to have packing removed and wound rechecked.  Patient given strict return precautions.  Patient expressed understanding of all instructions and is agreeable with this plan.  Final Clinical Impression(s) / ED Diagnoses Final diagnoses:  Abscess    Rx / DC Orders ED Discharge Orders     None        Berneice Heinrich 06/30/21 2312    Arby Barrette, MD 07/30/21 1506

## 2021-06-30 NOTE — ED Triage Notes (Signed)
Abscess right axilla x 2 days.

## 2021-07-27 IMAGING — DX DG CHEST 1V PORT
1 series · 1 of 1 positions shown · non-contrast
Comparison: Radiograph 05/25/2017

CLINICAL DATA: Shortness of breath, cough, congestion and chest
discomfort for 2 weeks, smoker

EXAM:
PORTABLE CHEST 1 VIEW

[chest ap]
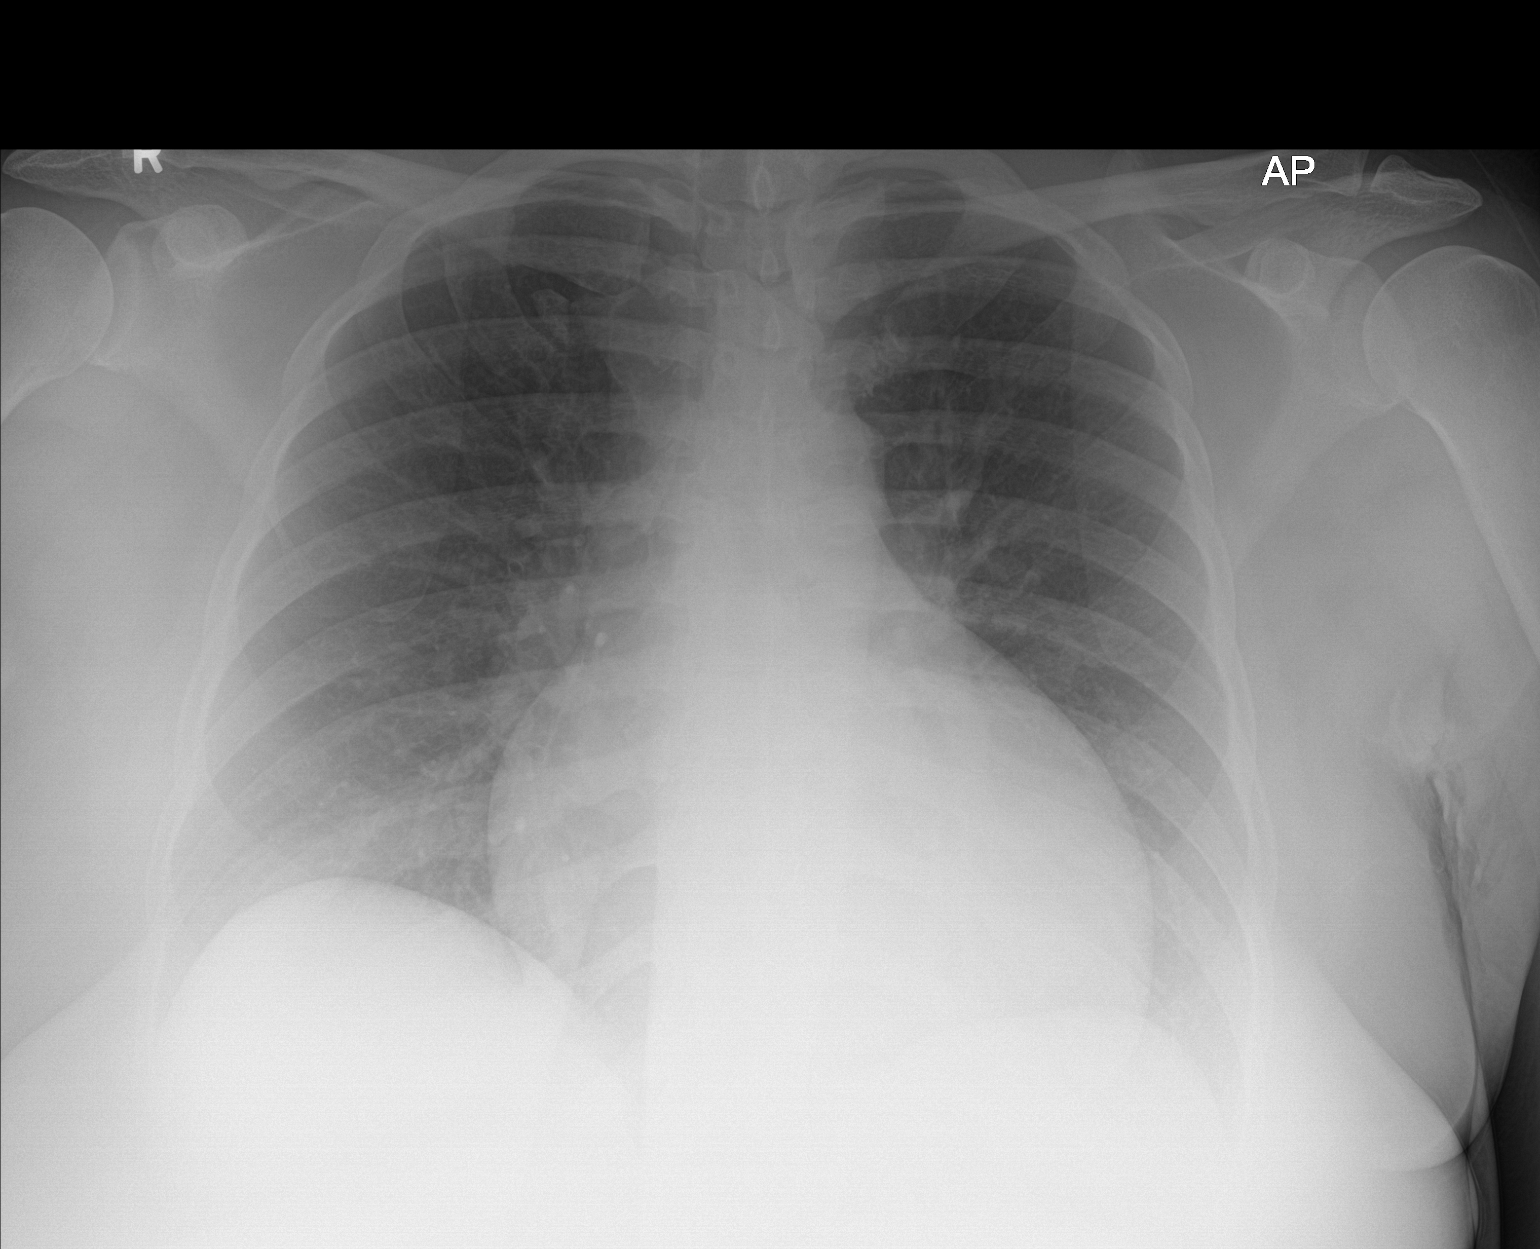

[1 of 1 positions shown; findings below may reference images not displayed]

FINDINGS: Increased attenuation of the lungs likely related to patient body
habitus and breast tissue. Accounting for body habitus, the lungs
are clear. No consolidation, features of edema, pneumothorax, or
effusion. Cardiac size appears similar to slightly increased from
comparison study with a lobular configuration of the heart which
could reflect true cardiomegaly or pericardial effusion. No acute
osseous or soft tissue abnormality.
IMPRESSION: 1. Cardiac size appears similar to slightly increased from
comparison study with a lobular configuration of the heart which
could reflect cardiomegaly or pericardial effusion.
2. No other acute cardiopulmonary abnormality.

## 2021-11-10 ENCOUNTER — Emergency Department (HOSPITAL_BASED_OUTPATIENT_CLINIC_OR_DEPARTMENT_OTHER)
Admission: EM | Admit: 2021-11-10 | Discharge: 2021-11-10 | Disposition: A | Discharge status: Home / Self Care | Payer: Self-pay | Attending: Emergency Medicine | Admitting: Emergency Medicine

## 2021-11-10 ENCOUNTER — Encounter (HOSPITAL_BASED_OUTPATIENT_CLINIC_OR_DEPARTMENT_OTHER): Payer: Self-pay

## 2021-11-10 ENCOUNTER — Emergency Department (HOSPITAL_BASED_OUTPATIENT_CLINIC_OR_DEPARTMENT_OTHER): Payer: Self-pay

## 2021-11-10 ENCOUNTER — Other Ambulatory Visit: Payer: Self-pay

## 2021-11-10 DIAGNOSIS — F17210 Nicotine dependence, cigarettes, uncomplicated: Secondary | ICD-10-CM | POA: Insufficient documentation

## 2021-11-10 DIAGNOSIS — R6889 Other general symptoms and signs: Secondary | ICD-10-CM

## 2021-11-10 DIAGNOSIS — R0602 Shortness of breath: Secondary | ICD-10-CM | POA: Insufficient documentation

## 2021-11-10 DIAGNOSIS — Z7984 Long term (current) use of oral hypoglycemic drugs: Secondary | ICD-10-CM | POA: Insufficient documentation

## 2021-11-10 DIAGNOSIS — R059 Cough, unspecified: Secondary | ICD-10-CM | POA: Insufficient documentation

## 2021-11-10 DIAGNOSIS — Z20822 Contact with and (suspected) exposure to covid-19: Secondary | ICD-10-CM | POA: Insufficient documentation

## 2021-11-10 DIAGNOSIS — E119 Type 2 diabetes mellitus without complications: Secondary | ICD-10-CM | POA: Insufficient documentation

## 2021-11-10 DIAGNOSIS — B3731 Acute candidiasis of vulva and vagina: Secondary | ICD-10-CM | POA: Insufficient documentation

## 2021-11-10 LAB — URINALYSIS, ROUTINE W REFLEX MICROSCOPIC
Bilirubin Urine: NEGATIVE
Glucose, UA: >=500 mg/dL — AB
Hgb urine dipstick: NEGATIVE
Ketones, ur: NEGATIVE mg/dL
Nitrite: NEGATIVE
Protein, ur: NEGATIVE mg/dL
Specific Gravity, Urine: 1.015 (ref 1.005–1.030)
pH: 5.5 (ref 5.0–8.0)

## 2021-11-10 LAB — URINALYSIS, MICROSCOPIC (REFLEX): RBC / HPF: NONE SEEN RBC/hpf (ref 0–5)

## 2021-11-10 LAB — RESP PANEL BY RT-PCR (FLU A&B, COVID) ARPGX2
Influenza A by PCR: NEGATIVE
Influenza B by PCR: NEGATIVE
SARS Coronavirus 2 by RT PCR: NEGATIVE

## 2021-11-10 LAB — WET PREP, GENITAL
Clue Cells Wet Prep HPF POC: NONE SEEN
Sperm: NONE SEEN
Trich, Wet Prep: NONE SEEN
WBC, Wet Prep HPF POC: >=10 — AB (ref <10)
Yeast Wet Prep HPF POC: NONE SEEN

## 2021-11-10 LAB — CBG MONITORING, ED: Glucose-Capillary: 223 mg/dL — ABNORMAL HIGH (ref 70–99)

## 2021-11-10 LAB — PREGNANCY, URINE: Preg Test, Ur: NEGATIVE

## 2021-11-10 MED ORDER — FLUCONAZOLE 150 MG PO TABS: ORAL_TABLET | ORAL | 1 refills | Status: DC

## 2021-11-10 MED ORDER — BENZONATATE 100 MG PO CAPS: 100.0000 mg | ORAL_CAPSULE | Freq: Three times a day (TID) | ORAL | 0 refills | Status: DC

## 2021-11-10 MED ORDER — FLUCONAZOLE 150 MG PO TABS
150.0000 mg | ORAL_TABLET | Freq: Once | ORAL | Status: AC
  Administered 2021-11-10: 150 mg via ORAL
  Filled 2021-11-10: qty 1

## 2021-11-10 NOTE — ED Triage Notes (Addendum)
Pt c/o flu like sx x 4 days-nosebleed x today-NAD-steady gait-pt added c/o vaginal d/c started yesterday

## 2021-11-10 NOTE — ED Provider Notes (Signed)
[facility] EMERGENCY DEPARTMENT Provider Note   CSN: [id number] Arrival date & time: 11/10/21  1522     History Chief Complaint  Patient presents with   Multiple c/o    Glenda Mann is a 35 y.o. female.  Patient patient is a 35 year old female with a history of diabetes who presents with flulike symptoms.  She has had these flulike symptoms for the last 4 to 5 days.  She has body aches, coughing, congestion and some intermittent nosebleeds.  No known fevers.  She has had some headaches.  No vomiting.  She has had some shortness of breath on lying flat otherwise no shortness of breath.  No leg swelling.  She also feels like she has a yeast infection which she typically has when she gets sick.  She has a little bit of discharge and some itching.  She used Monistat today but she prefers taking Diflucan.  No associate abdominal pain.  No pelvic pain.      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    Past Surgical History:  Procedure Laterality Date   CHOLECYSTECTOMY     INCISE AND DRAIN ABCESS       OB History     Gravida  2   Para  1   Term      Preterm      AB      Living         SAB      IAB      Ectopic      Multiple      Live Births              Family History  Problem Relation Age of Onset   Migraines Mother    Hypertension Maternal Grandmother    Breast cancer Paternal Grandmother     Social History   Tobacco Use   Smoking status: Every Day    Packs/day: 0.50    Types: Cigarettes   Smokeless tobacco: Never  Vaping Use   Vaping Use: Never used  Substance Use Topics   Alcohol use: Yes    Comment: weekends   Drug use: No    Home Medications Prior to Admission  medications   Medication Sig Start Date End Date Taking? Authorizing Provider  benzonatate (TESSALON) 100 MG capsule Take 1 capsule (100 mg total) by mouth every 8 (eight) hours. 11/10/21  Yes Rolan Bucco, MD  dapagliflozin propanediol (FARXIGA) 5 MG TABS tablet Take 1 tablet by mouth daily. 06/10/21   [provider]  fluconazole (DIFLUCAN) 150 MG tablet Take as needed for vaginal yeast infection. 11/10/21   Rolan Bucco, MD  glipiZIDE (GLUCOTROL) 10 MG tablet Take 10 mg by mouth daily before breakfast.    [provider]  levonorgestrel (MIRENA) 20 MCG/24HR IUD by Intrauterine route.    [provider]  metFORMIN (GLUCOPHAGE) 1000 MG tablet Take 1,000 mg by mouth 2 (two) times daily with a meal.    [provider]  ondansetron (ZOFRAN ODT) 4 MG disintegrating tablet Take 1 tablet (4 mg total) by mouth every 8 (eight) hours as needed for nausea or vomiting. 12/21/20   Rise Mu, PA-C  rosuvastatin (CRESTOR) 20 MG tablet Take 20 mg by mouth daily.    [provider]  Semaglutide (OZEMPIC, 1 MG/DOSE, [place]) Inject 1 mg/mL into the skin once a week.    [provider]  valACYclovir (VALTREX) 1000 MG tablet Take 1 tablet (1,000 mg total) by mouth 3 (three) times daily. 04/11/21   Molpus, John, MD    Allergies    Bactrim [sulfamethoxazole-trimethoprim], Penicillins, Reglan [metoclopramide], and Methylphenidate  Review of Systems   Review of Systems  Constitutional:  Positive for fatigue. Negative for chills, diaphoresis and fever.  HENT:  Positive for congestion, nosebleeds, rhinorrhea and sore throat. Negative for sneezing.   Eyes: Negative.   Respiratory:  Positive for cough and shortness of breath. Negative for chest tightness.   Cardiovascular:  Negative for chest pain and leg swelling.  Gastrointestinal:  Negative for abdominal pain, blood in stool, diarrhea, nausea and vomiting.  Genitourinary:  Positive for vaginal discharge.  Negative for difficulty urinating, flank pain, frequency, hematuria, pelvic pain, vaginal bleeding and vaginal pain.  Musculoskeletal:  Positive for myalgias. Negative for arthralgias and back pain.  Skin:  Negative for rash.  Neurological:  Positive for headaches. Negative for dizziness, speech difficulty, weakness and numbness.   Physical Exam Updated Vital Signs BP (!) 140/96 (BP Location: Right Arm)    Pulse 100    Temp 98.8 F (37.1 C) (Oral)    Resp 18    Ht 5\' 5"  (1.651 m)    Wt 106.5 kg    SpO2 100%    BMI 39.07 kg/m   Physical Exam Constitutional:      Appearance: She is well-developed.  HENT:     Head: Normocephalic and atraumatic.     Nose: Nose normal.     Comments: No current epistaxis    Mouth/Throat:     Mouth: Mucous membranes are moist.     Pharynx: No oropharyngeal exudate or posterior oropharyngeal erythema.  Eyes:     Conjunctiva/sclera: Conjunctivae normal.     Pupils: Pupils are equal, round, and reactive to light.  Cardiovascular:     Rate and Rhythm: Normal rate and regular rhythm.     Heart sounds: Normal heart sounds.  Pulmonary:     Effort: Pulmonary effort is normal. No respiratory distress.     Breath sounds: Normal breath sounds. No wheezing or rales.  Chest:     Chest wall: No tenderness.  Abdominal:     General: Bowel sounds are normal.     Palpations: Abdomen is soft.     Tenderness: There is no abdominal tenderness. There is no guarding or rebound.  Musculoskeletal:        General: Normal range of motion.     Cervical back: Normal range of motion and neck supple.     Right lower leg: No edema.     Left lower leg: No edema.  Lymphadenopathy:     Cervical: No cervical adenopathy.  Skin:    General: Skin is warm and dry.     Findings: No rash.  Neurological:     Mental Status: She is alert and oriented to person, place, and time.    ED Results / Procedures / Treatments   Labs (all labs ordered are listed, but only abnormal results are  displayed) Labs Reviewed  WET PREP, GENITAL - Abnormal; Notable for the following components:      Result Value   WBC, Wet Prep HPF POC >=10 (*)    All other components within  normal limits  URINALYSIS, ROUTINE W REFLEX MICROSCOPIC - Abnormal; Notable for the following components:   Glucose, UA >=500 (*)    Leukocytes,Ua TRACE (*)    All other components within normal limits  URINALYSIS, MICROSCOPIC (REFLEX) - Abnormal; Notable for the following components:   Bacteria, UA RARE (*)    All other components within normal limits  CBG MONITORING, ED - Abnormal; Notable for the following components:   Glucose-Capillary 223 (*)    All other components within normal limits  RESP PANEL BY RT-PCR (FLU A&B, COVID) ARPGX2  PREGNANCY, URINE  GC/CHLAMYDIA PROBE AMP ([place]) NOT AT [facility]    EKG None  Radiology DG Chest Port 1 View  Result Date: 11/10/2021 CLINICAL DATA:  Cough, shortness of breath EXAM: PORTABLE CHEST 1 VIEW COMPARISON:  12/21/2020 FINDINGS: Heart is borderline in size, stable. Lungs clear. No effusions or edema. No acute bony abnormality. IMPRESSION: Stable borderline cardiac size. No active cardiopulmonary disease. Electronically Signed   By: Rolm Baptise M.D.   On: 11/10/2021 18:51    Procedures Procedures   Medications Ordered in ED Medications  fluconazole (DIFLUCAN) tablet 150 mg (has no administration in time range)    ED Course  I have reviewed the triage vital signs and the nursing notes.  Pertinent labs & imaging results that were available during my care of the patient were reviewed by me and considered in my medical decision making (see chart for details).    MDM Rules/Calculators/A&P                         Patient is a 35 year old female who presents with flulike symptoms.  Her COVID and flu swab are negative.  She is otherwise well-appearing.  She has no hypoxia.  No fever.  No neck stiffness or other concerns for meningitis.  Chest x-ray was done  which shows no evidence of pneumonia.  She does have some evidence of yeast infection.  Her urine otherwise does not look infected.  She was started on Diflucan.  She was advised in symptomatic care instructions for her flulike illness.  Return precautions were given.    Final Clinical Impression(s) / ED Diagnoses Final diagnoses:  Flu-like symptoms  Yeast infection of the vagina    Rx / DC Orders ED Discharge Orders          Ordered    fluconazole (DIFLUCAN) 150 MG tablet        11/10/21 1947    benzonatate (TESSALON) 100 MG capsule  Every 8 hours        11/10/21 1947             Malvin Johns, MD 11/10/21 1949

## 2021-11-10 NOTE — ED Notes (Signed)
Resp swab delayed due to pt with nosebleed started back while in ED WR-pt holding direct pressure

## 2021-11-10 NOTE — ED Notes (Signed)
Patient discharged to home.  All discharge instructions reviewed.  Patient verbalized understanding via teachback method.  VS WDL.  Respirations even and unlabored.  Ambulatory out of ED.   °

## 2021-11-11 LAB — GC/CHLAMYDIA PROBE AMP (~~LOC~~) NOT AT ARMC
Chlamydia: NEGATIVE
Comment: NEGATIVE
Comment: NORMAL
Neisseria Gonorrhea: NEGATIVE

## 2021-11-21 ENCOUNTER — Other Ambulatory Visit: Payer: Self-pay

## 2021-11-24 IMAGING — DX DG CHEST 1V PORT
1 series · 1 of 1 positions shown · non-contrast
Comparison: October 16, 2020

CLINICAL DATA: Shortness of breath.

EXAM:
PORTABLE CHEST 1 VIEW

[chest ap]
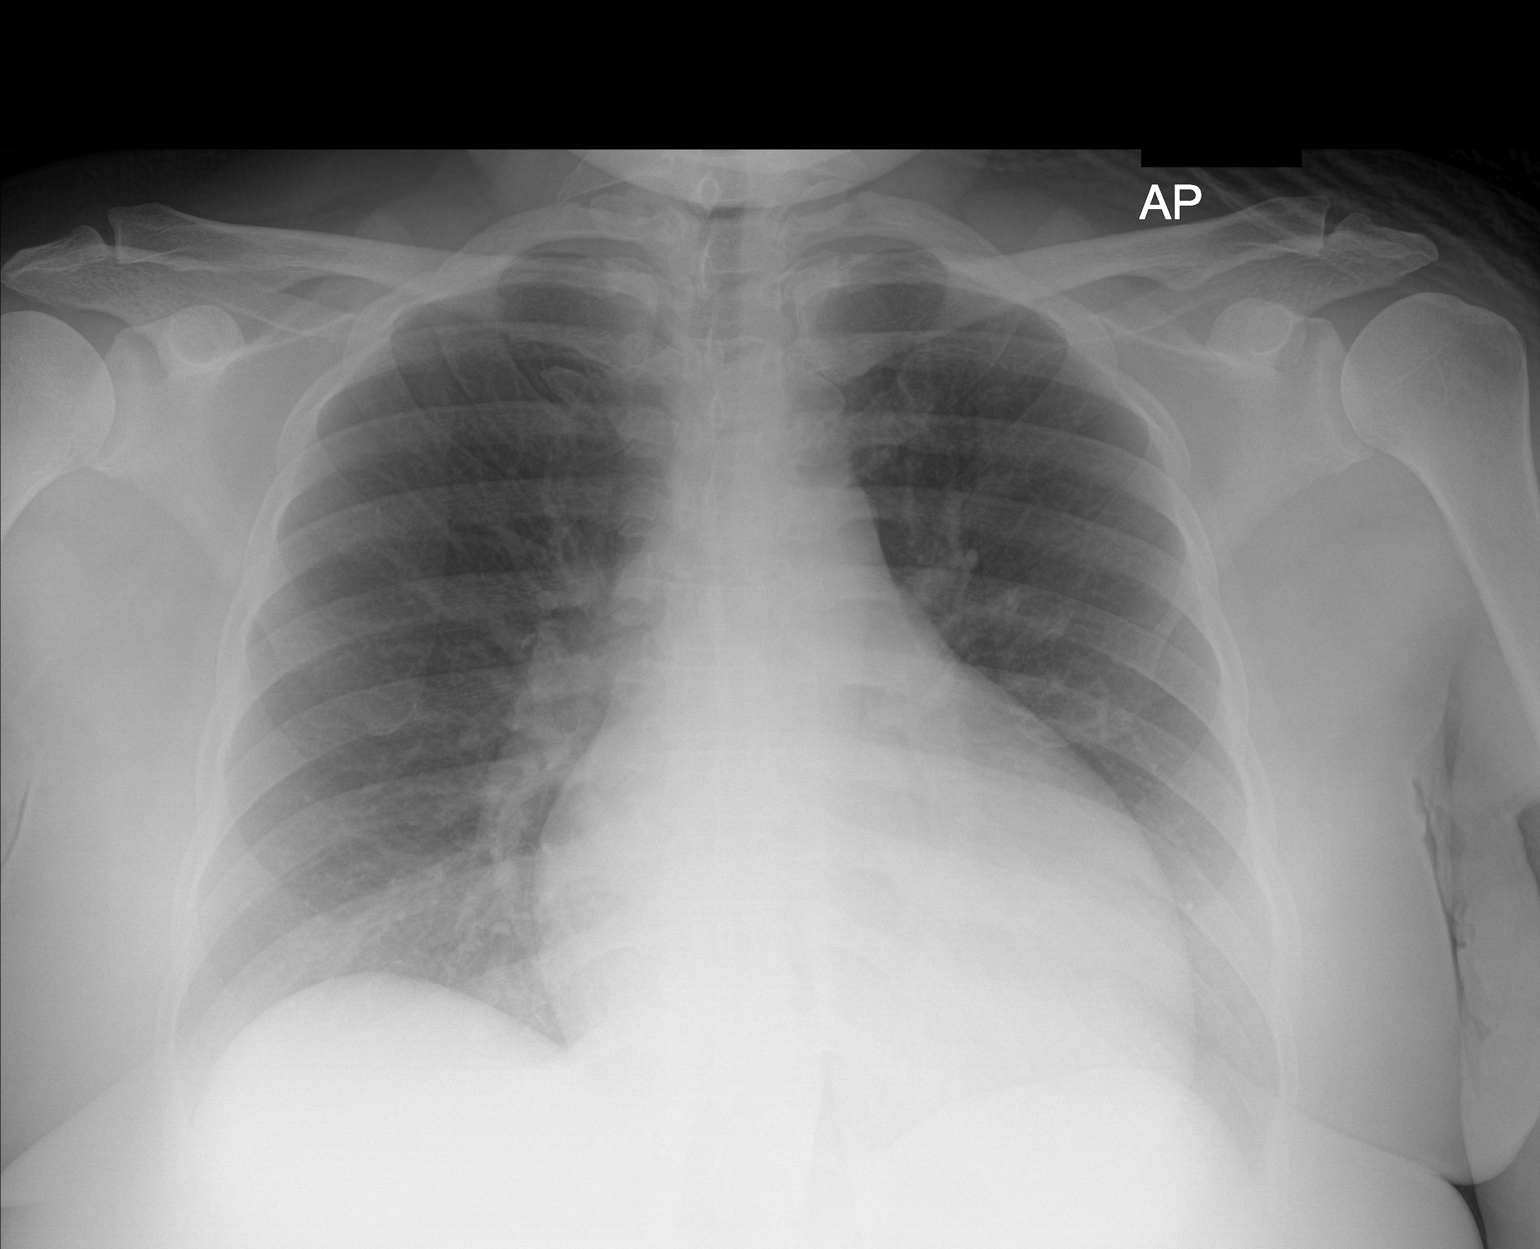

[1 of 1 positions shown; findings below may reference images not displayed]

FINDINGS: Lungs are clear. Heart is mildly enlarged, stable, with pulmonary
vascularity normal. No evident adenopathy. No bone lesions.
IMPRESSION: Stable cardiac prominence.  Lungs clear.

## 2022-01-07 ENCOUNTER — Encounter (HOSPITAL_BASED_OUTPATIENT_CLINIC_OR_DEPARTMENT_OTHER): Payer: Self-pay | Admitting: *Deleted

## 2022-01-07 ENCOUNTER — Emergency Department (HOSPITAL_BASED_OUTPATIENT_CLINIC_OR_DEPARTMENT_OTHER)
Admission: EM | Admit: 2022-01-07 | Discharge: 2022-01-07 | Disposition: A | Discharge status: Home / Self Care | Payer: 59 | Attending: Emergency Medicine | Admitting: Emergency Medicine

## 2022-01-07 ENCOUNTER — Other Ambulatory Visit: Payer: Self-pay

## 2022-01-07 ENCOUNTER — Emergency Department (HOSPITAL_BASED_OUTPATIENT_CLINIC_OR_DEPARTMENT_OTHER): Payer: 59

## 2022-01-07 DIAGNOSIS — N390 Urinary tract infection, site not specified: Secondary | ICD-10-CM | POA: Insufficient documentation

## 2022-01-07 DIAGNOSIS — R300 Dysuria: Secondary | ICD-10-CM | POA: Diagnosis present

## 2022-01-07 DIAGNOSIS — E119 Type 2 diabetes mellitus without complications: Secondary | ICD-10-CM | POA: Diagnosis not present

## 2022-01-07 DIAGNOSIS — Z7984 Long term (current) use of oral hypoglycemic drugs: Secondary | ICD-10-CM | POA: Insufficient documentation

## 2022-01-07 DIAGNOSIS — M25511 Pain in right shoulder: Secondary | ICD-10-CM | POA: Diagnosis not present

## 2022-01-07 LAB — URINALYSIS, ROUTINE W REFLEX MICROSCOPIC
Glucose, UA: 100 mg/dL — AB
Ketones, ur: NEGATIVE mg/dL
Nitrite: POSITIVE — AB
Protein, ur: 100 mg/dL — AB
Specific Gravity, Urine: >=1.03 (ref 1.005–1.030)
pH: 6 (ref 5.0–8.0)

## 2022-01-07 LAB — PREGNANCY, URINE: Preg Test, Ur: NEGATIVE

## 2022-01-07 LAB — URINALYSIS, MICROSCOPIC (REFLEX): RBC / HPF: >50 RBC/hpf (ref 0–5)

## 2022-01-07 MED ORDER — IBUPROFEN 400 MG PO TABS
400.0000 mg | ORAL_TABLET | Freq: Once | ORAL | Status: AC
Start: 2022-01-07 — End: 2022-01-07
  Administered 2022-01-07: 400 mg via ORAL
  Filled 2022-01-07: qty 1

## 2022-01-07 MED ORDER — PHENAZOPYRIDINE HCL 200 MG PO TABS: 200.0000 mg | ORAL_TABLET | Freq: Three times a day (TID) | ORAL | 0 refills | Status: DC

## 2022-01-07 MED ORDER — CEPHALEXIN 500 MG PO CAPS: 500.0000 mg | ORAL_CAPSULE | Freq: Four times a day (QID) | ORAL | 0 refills | Status: AC

## 2022-01-07 MED ORDER — PHENAZOPYRIDINE HCL 100 MG PO TABS
200.0000 mg | ORAL_TABLET | Freq: Once | ORAL | Status: AC
  Administered 2022-01-07: 200 mg via ORAL
  Filled 2022-01-07: qty 2

## 2022-01-07 MED ORDER — CEPHALEXIN 250 MG PO CAPS
500.0000 mg | ORAL_CAPSULE | Freq: Once | ORAL | Status: AC
  Administered 2022-01-07: 500 mg via ORAL
  Filled 2022-01-07: qty 2

## 2022-01-07 MED ORDER — ACETAMINOPHEN 500 MG PO TABS
1000.0000 mg | ORAL_TABLET | Freq: Once | ORAL | Status: AC
  Administered 2022-01-07: 1000 mg via ORAL
  Filled 2022-01-07: qty 2

## 2022-01-07 NOTE — ED Notes (Signed)
Patient transported to X-ray 

## 2022-01-07 NOTE — ED Notes (Signed)
Discharge instructions including prescription and follow up care discussed with pt. Pt verbalized understanding with no questions at this time.  

## 2022-01-07 NOTE — ED Triage Notes (Addendum)
Pt states she saw her GYN this week for dysuria and frequency. Dx with BV and started on flagyl today. States she was screened for other STDs and they were negative. Reports today she had had vaginal bleeding and feels "bloated" and has pain with urination. Reports she has had her mirena IUD for 5 years. States she has pain with movement in right shoulder since last night. Denies injury

## 2022-01-07 NOTE — Discharge Instructions (Addendum)
So while you are taking the antibiotic for the UTI hold the flagyl.  Continue taking the Diflucan for the yeast.  You are also given a medication to help with the discomfort with urinating.  I would expect improvement within the next 1 to 2 days after you start the antibiotic. You can take ibuprofen and another muscle relaxer for your shoulder.  You could also try muscle rubs for the shoulder and a heating pad.

## 2022-01-07 NOTE — ED Notes (Signed)
Assisted provided with bedside pelvic exam

## 2022-01-07 NOTE — ED Provider Notes (Signed)
[facility] EMERGENCY DEPARTMENT Provider Note   CSN: [id number] Arrival date & time: 01/07/22  1931     History  Chief Complaint  Patient presents with   Arm Pain   Dysuria    Glenda Mann is a 36 y.o. female.  Patient is a 36 year old female with a history of diabetes, high cholesterol who is presenting today with complaints of suprapubic pain, dysuria, frequency.  Patient reports she was having this earlier last week but she went and saw her OB/GYN 3 days ago and reports by the time she saw her OB/GYN and the symptoms had improved.  However she had a pelvic exam which based on their report was negative for acute findings.  She was swabbed and found to have BV and yeast but GC chlamydia negative.  She was given a prescription for Flagyl and Diflucan.  Patient started taking those medications this morning but reported that when she woke up this morning she was having urinary symptoms again.  She has lower suprapubic pressure and discomfort and pain when urinating.  She also started noticing blood in her urine today.  She does not think that she is having vaginal bleeding and has an IUD and normally does not have menses.  She has not had fever or vomiting.  She does mention that since last night she started to have some right shoulder pain and it has worsened as the day has progressed.  It is in the front of her shoulder and worse with any kind of movement.  She is right-handed but denies any known trauma.  She has not noticed any swelling or redness of her arm.  She did take Flexeril at home but it did not help with the pain.  The history is provided by the patient.  Arm Pain  Dysuria     Home Medications Prior to Admission medications   Medication Sig Start Date End Date Taking? Authorizing Provider  cephALEXin (KEFLEX) 500 MG capsule Take 1 capsule (500 mg total) by mouth 4 (four) times daily for 7 days. 01/07/22 01/14/22 Yes Rushawn Capshaw, Alphonzo Lemmings, MD  phenazopyridine  (PYRIDIUM) 200 MG tablet Take 1 tablet (200 mg total) by mouth 3 (three) times daily. 01/07/22  Yes Adelene Polivka, Alphonzo Lemmings, MD  benzonatate (TESSALON) 100 MG capsule Take 1 capsule (100 mg total) by mouth every 8 (eight) hours. 11/10/21   Rolan Bucco, MD  dapagliflozin propanediol (FARXIGA) 5 MG TABS tablet Take 1 tablet by mouth daily. 06/10/21   [provider]  fluconazole (DIFLUCAN) 150 MG tablet Take as needed for vaginal yeast infection. 11/10/21   Rolan Bucco, MD  glipiZIDE (GLUCOTROL) 10 MG tablet Take 10 mg by mouth daily before breakfast.    [provider]  levonorgestrel (MIRENA) 20 MCG/24HR IUD by Intrauterine route.    [provider]  metFORMIN (GLUCOPHAGE) 1000 MG tablet Take 1,000 mg by mouth 2 (two) times daily with a meal.    [provider]  ondansetron (ZOFRAN ODT) 4 MG disintegrating tablet Take 1 tablet (4 mg total) by mouth every 8 (eight) hours as needed for nausea or vomiting. 12/21/20   Demetrios Loll T, PA-C  rosuvastatin (CRESTOR) 20 MG tablet Take 20 mg by mouth daily.    [provider]  Semaglutide (OZEMPIC, 1 MG/DOSE, [place]) Inject 1 mg/mL into the skin once a week.    [provider]  valACYclovir (VALTREX) 1000 MG tablet Take 1 tablet (1,000 mg total) by mouth 3 (three) times daily. 04/11/21   Molpus,  Jonny Ruiz, MD      Allergies    Bactrim [sulfamethoxazole-trimethoprim], Penicillins, Reglan [metoclopramide], and Methylphenidate    Review of Systems   Review of Systems  Genitourinary:  Positive for dysuria.   Physical Exam Updated Vital Signs BP 136/80    Pulse 100    Temp 98.3 F (36.8 C) (Oral)    Resp 18    Ht 5\' 5"  (1.651 m)    Wt 107.5 kg    SpO2 100%    BMI 39.44 kg/m  Physical Exam Vitals and nursing note reviewed.  Constitutional:      General: She is not in acute distress.    Appearance: Normal appearance. She is well-developed.  HENT:     Head: Normocephalic and atraumatic.  Eyes:     Pupils:  Pupils are equal, round, and reactive to light.  Cardiovascular:     Rate and Rhythm: Normal rate and regular rhythm.     Heart sounds: Normal heart sounds. No murmur heard.   No friction rub.  Pulmonary:     Effort: Pulmonary effort is normal.     Breath sounds: Normal breath sounds. No wheezing or rales.  Abdominal:     General: Bowel sounds are normal. There is no distension.     Palpations: Abdomen is soft.     Tenderness: There is abdominal tenderness in the suprapubic area. There is no right CVA tenderness, left CVA tenderness, guarding or rebound.  Musculoskeletal:        General: Tenderness present. Normal range of motion.     Right shoulder: Tenderness present. No swelling.       Arms:     Comments: No edema  Skin:    General: Skin is warm and dry.     Findings: No rash.  Neurological:     Mental Status: She is alert and oriented to person, place, and time. Mental status is at baseline.     Cranial Nerves: No cranial nerve deficit.  Psychiatric:        Mood and Affect: Mood normal.        Behavior: Behavior normal.    ED Results / Procedures / Treatments   Labs (all labs ordered are listed, but only abnormal results are displayed) Labs Reviewed  URINALYSIS, ROUTINE W REFLEX MICROSCOPIC - Abnormal; Notable for the following components:      Result Value   Color, Urine BROWN (*)    APPearance TURBID (*)    Glucose, UA 100 (*)    Hgb urine dipstick LARGE (*)    Bilirubin Urine SMALL (*)    Protein, ur 100 (*)    Nitrite POSITIVE (*)    Leukocytes,Ua SMALL (*)    All other components within normal limits  URINALYSIS, MICROSCOPIC (REFLEX) - Abnormal; Notable for the following components:   Bacteria, UA MANY (*)    All other components within normal limits  URINE CULTURE  PREGNANCY, URINE    EKG None  Radiology DG Shoulder Right  Result Date: 01/07/2022 CLINICAL DATA:  Right shoulder pain EXAM: RIGHT SHOULDER - 2+ VIEW COMPARISON:  None. FINDINGS: No  fracture or dislocation is seen. The joint spaces are preserved. Visualized soft tissues are within normal limits. Visualized right lung is clear. IMPRESSION: Negative. Electronically Signed   By: Charline Bills M.D.   On: 01/07/2022 20:36    Procedures Procedures    Medications Ordered in ED Medications  ibuprofen (ADVIL) tablet 400 mg (400 mg Oral Given 01/07/22 2012)  acetaminophen (TYLENOL)  tablet 1,000 mg (1,000 mg Oral Given 01/07/22 2012)  cephALEXin (KEFLEX) capsule 500 mg (500 mg Oral Given 01/07/22 2139)  phenazopyridine (PYRIDIUM) tablet 200 mg (200 mg Oral Given 01/07/22 2139)    ED Course/ Medical Decision Making/ A&P                           Medical Decision Making Amount and/or Complexity of Data Reviewed Labs: ordered. Decision-making details documented in ED Course. Radiology: ordered and independent interpretation performed. Decision-making details documented in ED Course.  Risk OTC drugs. Prescription drug management.   Patient is a 36 year old female presenting today with dysuria frequency and urgency.  Patient was seen 3 days ago at her OB/GYN and at that time had a benign pelvic exam but tested positive for yeast and BV.  She was given Flagyl and fluconazole which she started taking today but urinary symptoms started before the medication.  She reports these are the same symptoms she had prior to seeing the OB/GYN but they had resolved by the time she had gotten appointment and been evaluated.  Her pelvic exam at the OB/GYN was negative.  Urine at OB/GYN was negative.  On exam today urine is brown and bloody appearing.  A pelvic exam was done here to determine where the bleeding was coming from and there is no blood in the vaginal vault and assume the bleeding is from the bladder.  UA here shows signs concerning for infection.  I independently interpreted patient's labs and her UA today shows nitrite and leukocyte positive, greater than 50 red cells and many bacteria  and white cells are present.  Flagyl and Diflucan will not treat a UTI.  We will have her hold the Flagyl at this time and start antibiotic for UTI.  Culture was done given the unusual symptoms of the patient and intermittent nature.  Discussed with her if she has recurrent bouts of this she will need to follow-up with urology for further evaluation.   Also patient is complaining of pain in her right shoulder.  I independently interpreted patient's x-ray and it is negative.  Radiology reports no acute findings.  At this time there is no evidence of septic joint.  She is not febrile here.  She does not have restricted range of motion but does have pain with palpation in the [facility] joint.  She denies any trauma.  She is right-handed and most likely inflammatory in nature.  Will treat conservatively at this time but encourage patient to follow-up with sports med if symptoms do not improve.  Patient does not not meet admission criteria at this time.  She was given return precautions.  She was given treatment for her pain and infection.        Final Clinical Impression(s) / ED Diagnoses Final diagnoses:  Lower urinary tract infectious disease  Acute pain of right shoulder    Rx / DC Orders ED Discharge Orders          Ordered    cephALEXin (KEFLEX) 500 MG capsule  4 times daily        01/07/22 2140    phenazopyridine (PYRIDIUM) 200 MG tablet  3 times daily        01/07/22 2140              Gwyneth Sprout, MD 01/07/22 2145

## 2022-01-09 LAB — URINE CULTURE: Culture: 30000 — AB

## 2022-12-11 IMAGING — CR DG SHOULDER 2+V*R*
3 series · 3 of 3 positions shown · non-contrast
Comparison: None.

CLINICAL DATA: Right shoulder pain

EXAM:
RIGHT SHOULDER - 2+ VIEW

[w shoulder grashey right]
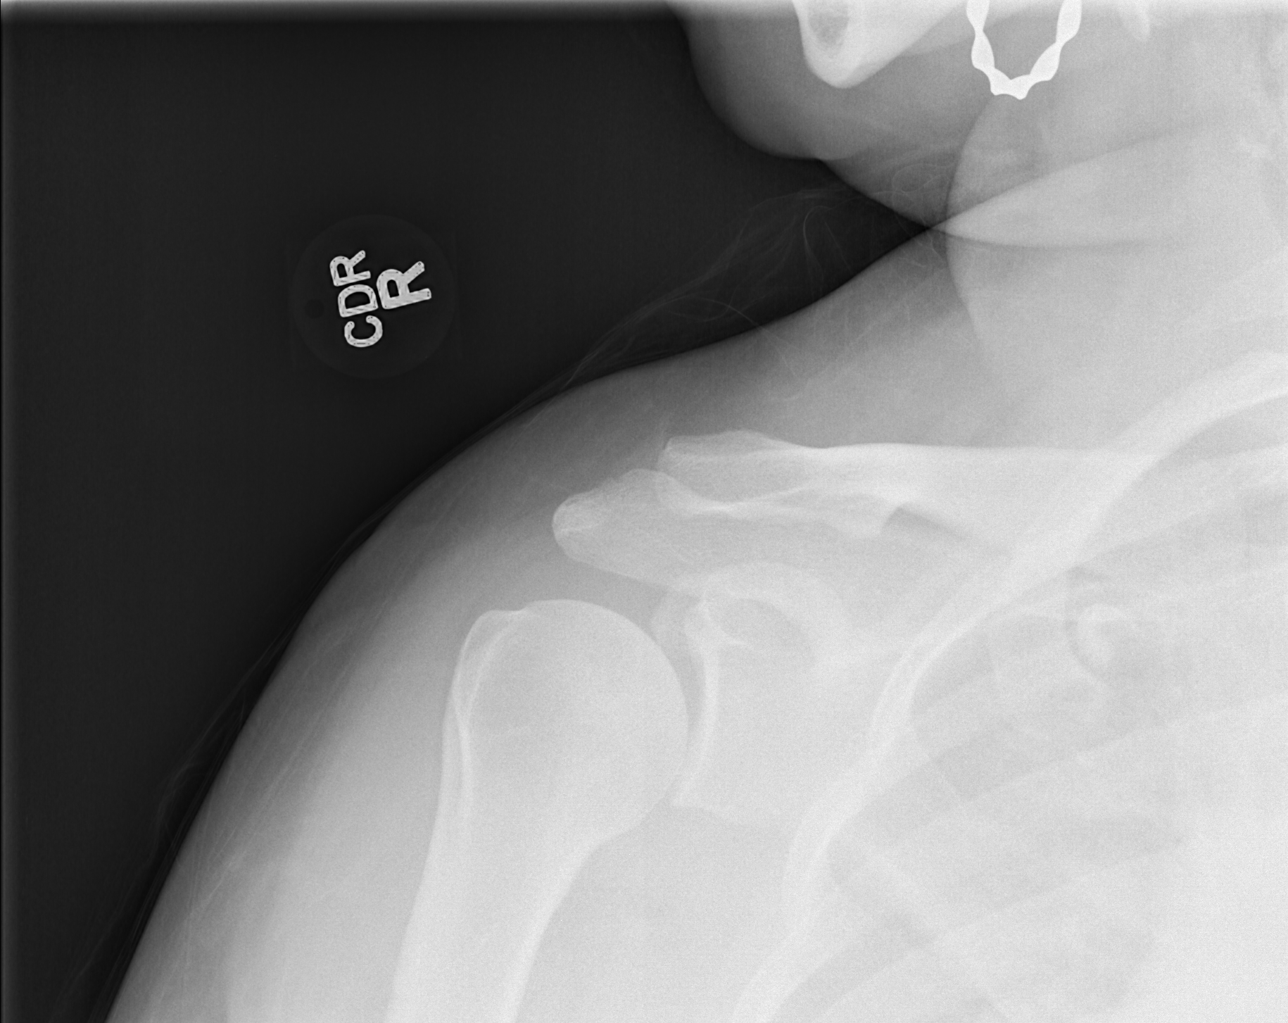

[w shoulder y view right]
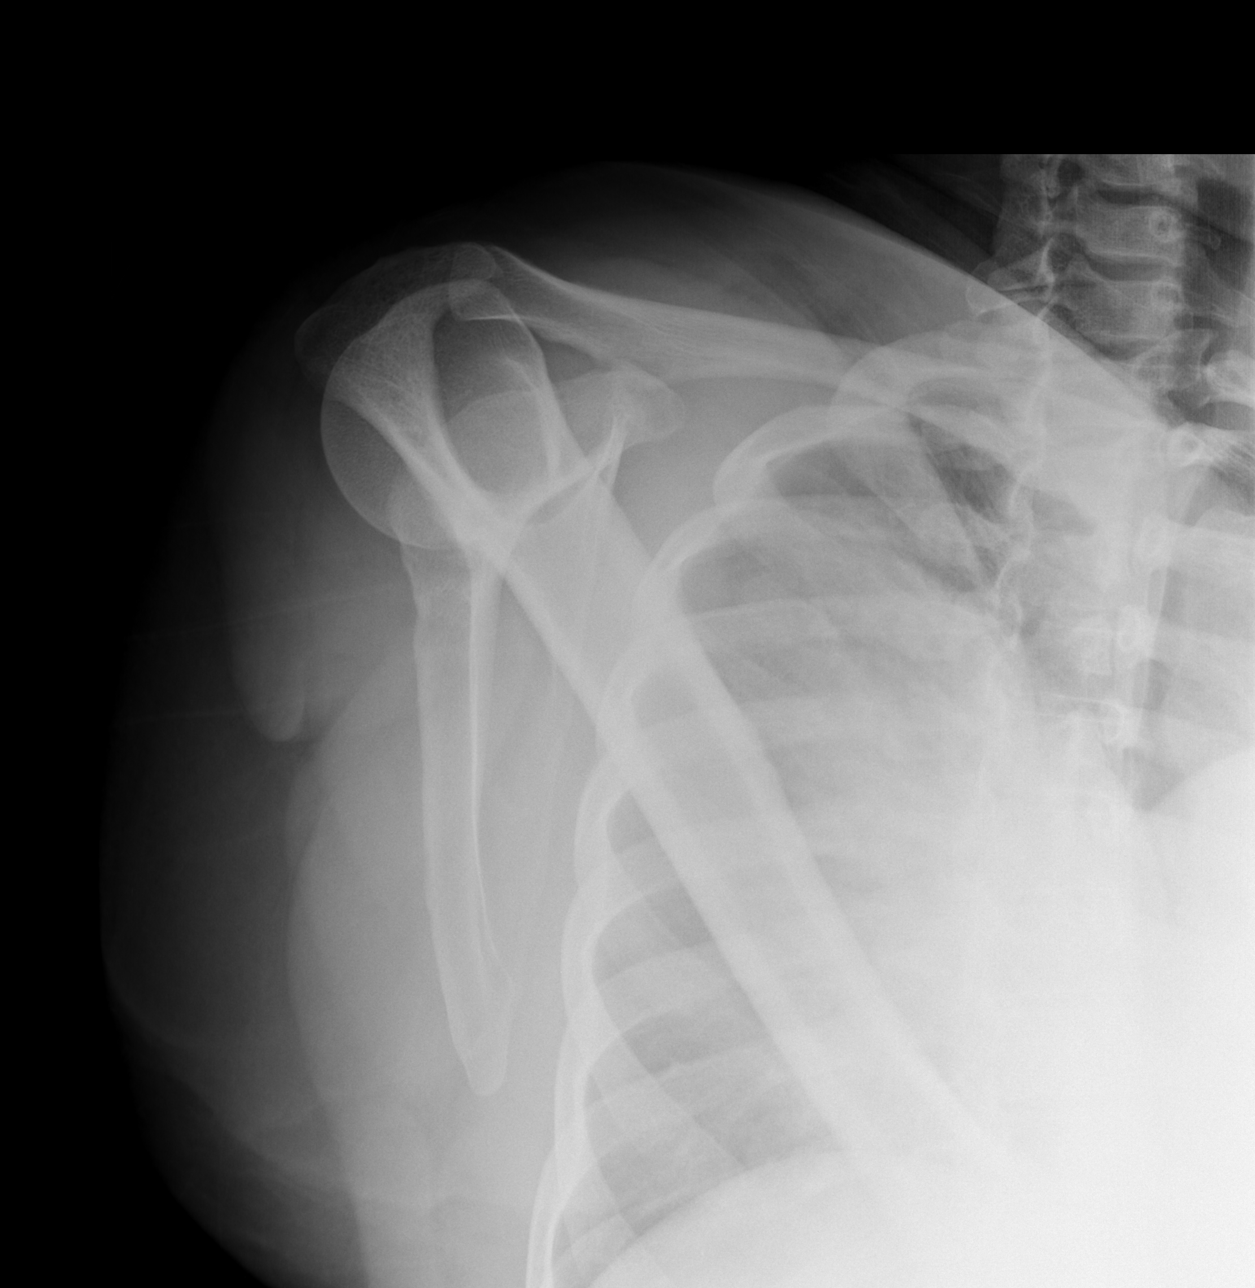

[x shoulder axillary right *]
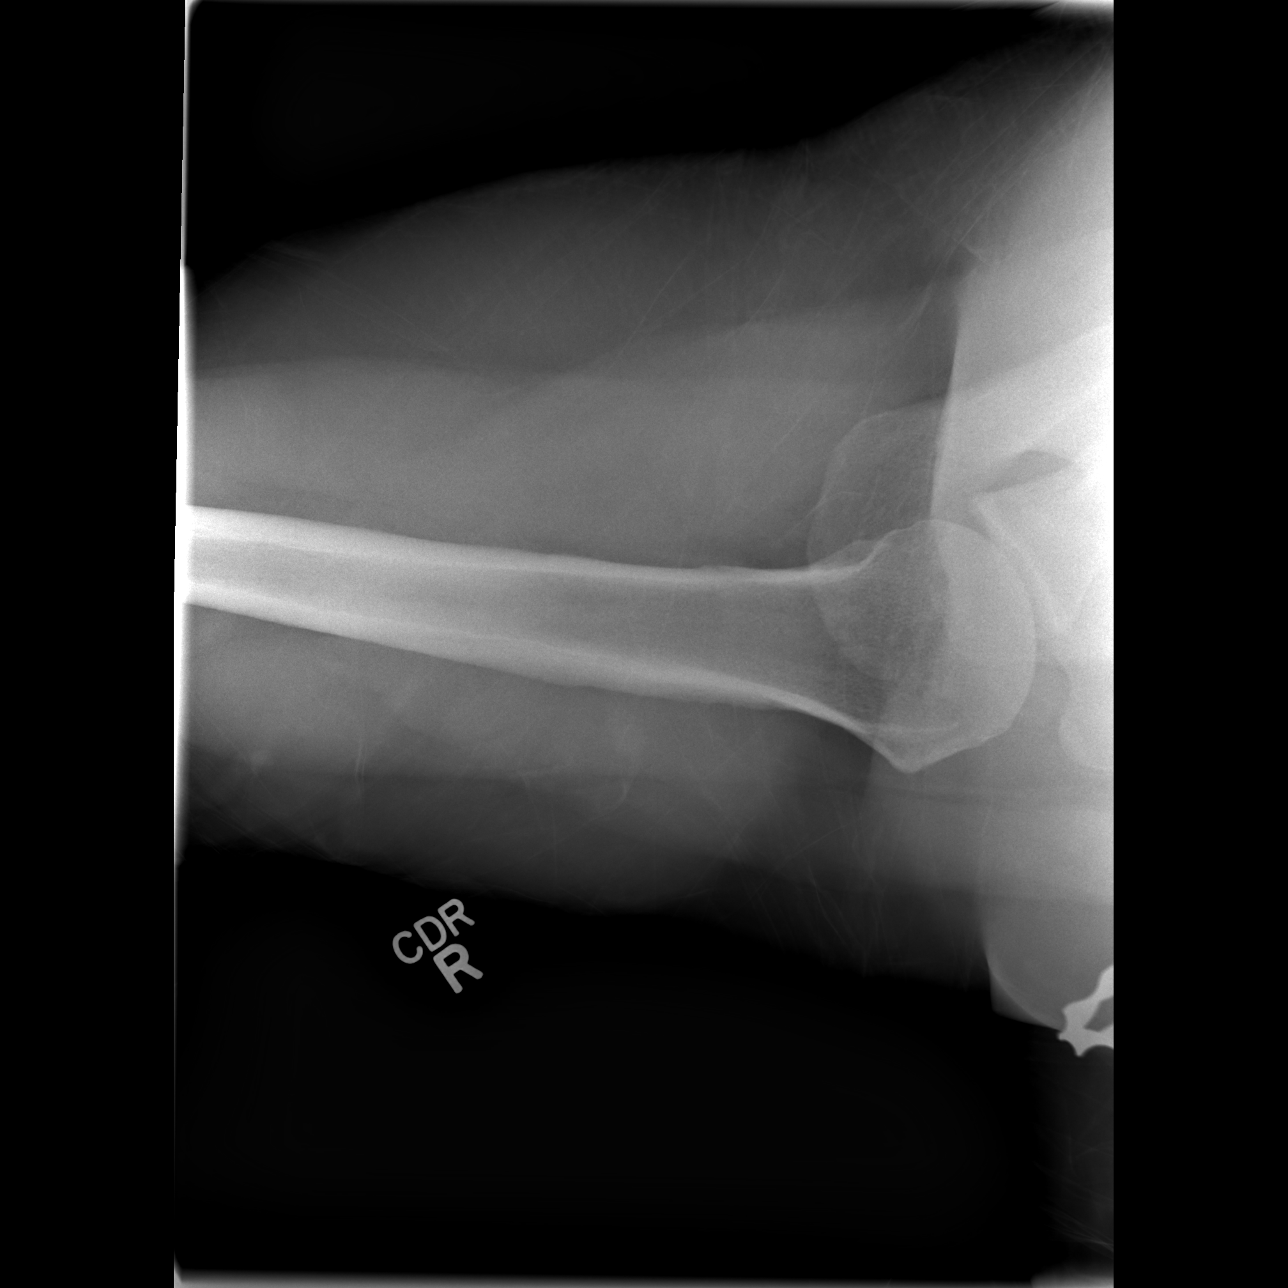

[3 of 3 positions shown; findings below may reference images not displayed]

FINDINGS: No fracture or dislocation is seen.

The joint spaces are preserved.

Visualized soft tissues are within normal limits.

Visualized right lung is clear.
IMPRESSION: Negative.

## 2022-12-27 ENCOUNTER — Other Ambulatory Visit: Payer: Self-pay

## 2022-12-27 ENCOUNTER — Encounter (HOSPITAL_BASED_OUTPATIENT_CLINIC_OR_DEPARTMENT_OTHER): Payer: Self-pay

## 2022-12-27 ENCOUNTER — Emergency Department (HOSPITAL_BASED_OUTPATIENT_CLINIC_OR_DEPARTMENT_OTHER)
Admission: EM | Admit: 2022-12-27 | Discharge: 2022-12-27 | Disposition: A | Discharge status: Home / Self Care | Payer: 59 | Attending: Emergency Medicine | Admitting: Emergency Medicine

## 2022-12-27 ENCOUNTER — Emergency Department (HOSPITAL_BASED_OUTPATIENT_CLINIC_OR_DEPARTMENT_OTHER): Payer: 59

## 2022-12-27 DIAGNOSIS — R3915 Urgency of urination: Secondary | ICD-10-CM | POA: Insufficient documentation

## 2022-12-27 DIAGNOSIS — R102 Pelvic and perineal pain: Secondary | ICD-10-CM | POA: Insufficient documentation

## 2022-12-27 DIAGNOSIS — R300 Dysuria: Secondary | ICD-10-CM | POA: Diagnosis present

## 2022-12-27 DIAGNOSIS — R350 Frequency of micturition: Secondary | ICD-10-CM | POA: Insufficient documentation

## 2022-12-27 HISTORY — DX: Sleep apnea, unspecified: G47.30

## 2022-12-27 LAB — WET PREP, GENITAL
Clue Cells Wet Prep HPF POC: NONE SEEN
Sperm: NONE SEEN
Trich, Wet Prep: NONE SEEN
WBC, Wet Prep HPF POC: <10 (ref <10)
Yeast Wet Prep HPF POC: NONE SEEN

## 2022-12-27 LAB — URINALYSIS, ROUTINE W REFLEX MICROSCOPIC
Bilirubin Urine: NEGATIVE
Glucose, UA: NEGATIVE mg/dL
Ketones, ur: NEGATIVE mg/dL
Nitrite: NEGATIVE
Protein, ur: 30 mg/dL — AB
Specific Gravity, Urine: >=1.03 (ref 1.005–1.030)
pH: 5.5 (ref 5.0–8.0)

## 2022-12-27 LAB — URINALYSIS, MICROSCOPIC (REFLEX): RBC / HPF: >50 RBC/hpf (ref 0–5)

## 2022-12-27 LAB — PREGNANCY, URINE: Preg Test, Ur: NEGATIVE

## 2022-12-27 MED ORDER — FLUCONAZOLE 150 MG PO TABS: 150.0000 mg | ORAL_TABLET | Freq: Every day | ORAL | 0 refills | Status: AC

## 2022-12-27 MED ORDER — NITROFURANTOIN MONOHYD MACRO 100 MG PO CAPS: 100.0000 mg | ORAL_CAPSULE | Freq: Two times a day (BID) | ORAL | 0 refills | Status: DC

## 2022-12-27 MED ORDER — IBUPROFEN 800 MG PO TABS
800.0000 mg | ORAL_TABLET | Freq: Once | ORAL | Status: AC
  Administered 2022-12-27: 800 mg via ORAL
  Filled 2022-12-27: qty 1

## 2022-12-27 NOTE — ED Notes (Signed)
0 ml detected with bladder scan

## 2022-12-27 NOTE — ED Triage Notes (Signed)
Pt c/o lower abdominal/bladder pressure and dysuria starting this morning.  Pain score 4/10.

## 2022-12-27 NOTE — ED Provider Notes (Signed)
[facility] Provider Note   CSN: [id number] Arrival date & time: 12/27/22  1024     History  Chief Complaint  Patient presents with   Abdominal Pain   Dysuria    Glenda Mann is a 37 y.o. female, no pertinent past medical history, who presents to the ED secondary to suprapubic pain, dysuria, increased urinary frequency and urgency x 1 day.  She states that she woke up today, and had some suprapubic pain, pain with urination, and some blood after wiping.  Denies any blood in her urine, no vaginal discharge, no concern for STDs per patient.  Denies any nausea, vomiting, new back pain, fevers.     Home Medications Prior to Admission medications   Medication Sig Start Date End Date Taking? Authorizing Provider  fluconazole (DIFLUCAN) 150 MG tablet Take 1 tablet (150 mg total) by mouth daily for 1 day. 12/27/22 12/28/22 Yes Winfrey Chillemi L, PA  nitrofurantoin, macrocrystal-monohydrate, (MACROBID) 100 MG capsule Take 1 capsule (100 mg total) by mouth 2 (two) times daily. 12/27/22  Yes Remedios Mckone L, PA  benzonatate (TESSALON) 100 MG capsule Take 1 capsule (100 mg total) by mouth every 8 (eight) hours. 11/10/21   Malvin Johns, MD  dapagliflozin propanediol (FARXIGA) 5 MG TABS tablet Take 1 tablet by mouth daily. 06/10/21   [provider]  glipiZIDE (GLUCOTROL) 10 MG tablet Take 10 mg by mouth daily before breakfast.    [provider]  levonorgestrel (MIRENA) 20 MCG/24HR IUD by Intrauterine route.    [provider]  metFORMIN (GLUCOPHAGE) 1000 MG tablet Take 1,000 mg by mouth 2 (two) times daily with a meal.    [provider]  ondansetron (ZOFRAN ODT) 4 MG disintegrating tablet Take 1 tablet (4 mg total) by mouth every 8 (eight) hours as needed for nausea or vomiting. 12/21/20   Leaphart, Zack Seal, PA-C  phenazopyridine (PYRIDIUM) 200 MG tablet Take 1 tablet (200 mg total) by mouth 3 (three) times daily.  01/07/22   Blanchie Dessert, MD  rosuvastatin (CRESTOR) 20 MG tablet Take 20 mg by mouth daily.    [provider]  Semaglutide (OZEMPIC, 1 MG/DOSE, [place]) Inject 1 mg/mL into the skin once a week.    [provider]  valACYclovir (VALTREX) 1000 MG tablet Take 1 tablet (1,000 mg total) by mouth 3 (three) times daily. 04/11/21   Molpus, John, MD      Allergies    Bactrim [sulfamethoxazole-trimethoprim], Penicillins, Reglan [metoclopramide], and Methylphenidate    Review of Systems   Review of Systems  Constitutional:  Negative for fever.  Gastrointestinal:  Positive for abdominal pain. Negative for nausea and vomiting.  Genitourinary:  Positive for dysuria.    Physical Exam Updated Vital Signs BP 133/84   Pulse 99   Temp 98.4 F (36.9 C)   Resp 16   Ht 5\' 5"  (1.651 m)   Wt 119.7 kg   SpO2 99%   BMI 43.93 kg/m  Physical Exam Vitals and nursing note reviewed.  Constitutional:      General: She is not in acute distress.    Appearance: She is well-developed.  HENT:     Head: Normocephalic and atraumatic.  Eyes:     Conjunctiva/sclera: Conjunctivae normal.  Cardiovascular:     Rate and Rhythm: Normal rate and regular rhythm.     Heart sounds: No murmur heard. Pulmonary:     Effort: Pulmonary effort is normal. No respiratory distress.  Breath sounds: Normal breath sounds.  Abdominal:     Palpations: Abdomen is soft.     Tenderness: There is abdominal tenderness in the suprapubic area. There is no right CVA tenderness or left CVA tenderness.  Musculoskeletal:        General: No swelling.     Cervical back: Neck supple.  Skin:    General: Skin is warm and dry.     Capillary Refill: Capillary refill takes less than 2 seconds.  Neurological:     Mental Status: She is alert.  Psychiatric:        Mood and Affect: Mood normal.     ED Results / Procedures / Treatments   Labs (all labs ordered are listed, but only abnormal results are displayed) Labs  Reviewed  URINALYSIS, ROUTINE W REFLEX MICROSCOPIC - Abnormal; Notable for the following components:      Result Value   APPearance CLOUDY (*)    Hgb urine dipstick LARGE (*)    Protein, ur 30 (*)    Leukocytes,Ua Shaurya Raw[place] (*)    All other components within normal limits  URINALYSIS, MICROSCOPIC (REFLEX) - Abnormal; Notable for the following components:   Bacteria, UA RARE (*)    All other components within normal limits  WET PREP, GENITAL  PREGNANCY, URINE    EKG None  Radiology No results found.  Procedures Procedures   Medications Ordered in ED Medications  ibuprofen (ADVIL) tablet 800 mg (800 mg Oral Given 12/27/22 1139)    ED Course/ Medical Decision Making/ A&P                           Medical Decision Making Patient is a 37 y.o. female, here for suprapubic pain, urinary frequency for the past day.  We will obtain a urinalysis, a wet prep given that patient frequently has BV yeast infections, and then a CT renal study to evaluate for possible ureteral stone causing UTI-like symptoms.  Amount and/or Complexity of Data Reviewed Labs: ordered.    Details: Large amount of blood, cloudy appearance, and rare bacteria in urinalysis, wet prep negative Radiology: ordered.    Details: CT renal study negative Discussion of management or test interpretation with external provider(s): Discussed with patient she does have very specific urinary symptoms, does have blood in her urine without a kidney stone, she states she is not currently menstruating either.  Pain is primarily suprapubic, and worse with urinating, we will go ahead and treat for urinary tract infection, and start her on Macrobid as she has a penicillin allergy.  I discussed return precautions, she has no CVA tenderness, and is in no distress, she was agreeable.  She was bladder scanned prior to leaving, and 0 mL out, stating that she was not retaining.  Understands that she will need to follow-up with primary care doctor  for further evaluation.  Yeast medication sent per patient request.  Risk Prescription drug management.    Final Clinical Impression(s) / ED Diagnoses Final diagnoses:  Urinary frequency  Urinary urgency  Pelvic pressure in female    Rx / DC Orders ED Discharge Orders          Ordered    nitrofurantoin, macrocrystal-monohydrate, (MACROBID) 100 MG capsule  2 times daily        12/27/22 1532    fluconazole (DIFLUCAN) 150 MG tablet  Daily        12/27/22 1538  Osvaldo Shipper, [place] 12/27/22 1648    Hayden Rasmussen, MD 12/27/22 [id number]

## 2022-12-27 NOTE — Discharge Instructions (Addendum)
Given that your symptoms are exactly the same as your past UTI, we will go ahead and treat you as you have a UTI.  If you develop fever, severe pain, please return to the ER.

## 2023-01-21 ENCOUNTER — Emergency Department (HOSPITAL_BASED_OUTPATIENT_CLINIC_OR_DEPARTMENT_OTHER)
Admission: EM | Admit: 2023-01-21 | Discharge: 2023-01-21 | Disposition: A | Discharge status: Home / Self Care | Payer: 59 | Attending: Emergency Medicine | Admitting: Emergency Medicine

## 2023-01-21 ENCOUNTER — Other Ambulatory Visit: Payer: Self-pay

## 2023-01-21 ENCOUNTER — Encounter (HOSPITAL_BASED_OUTPATIENT_CLINIC_OR_DEPARTMENT_OTHER): Payer: Self-pay | Admitting: Emergency Medicine

## 2023-01-21 DIAGNOSIS — M62830 Muscle spasm of back: Secondary | ICD-10-CM | POA: Insufficient documentation

## 2023-01-21 DIAGNOSIS — M549 Dorsalgia, unspecified: Secondary | ICD-10-CM | POA: Diagnosis present

## 2023-01-21 MED ORDER — ACETAMINOPHEN 500 MG PO TABS: 500.0000 mg | ORAL_TABLET | Freq: Four times a day (QID) | ORAL | 0 refills | Status: AC | PRN

## 2023-01-21 MED ORDER — METHOCARBAMOL 500 MG PO TABS: 500.0000 mg | ORAL_TABLET | Freq: Two times a day (BID) | ORAL | 0 refills | Status: DC

## 2023-01-21 MED ORDER — NAPROXEN 250 MG PO TABS
500.0000 mg | ORAL_TABLET | Freq: Once | ORAL | Status: AC
  Administered 2023-01-21: 500 mg via ORAL
  Filled 2023-01-21: qty 2

## 2023-01-21 MED ORDER — LIDOCAINE 5 % EX PTCH: 1.0000 | MEDICATED_PATCH | CUTANEOUS | 0 refills | Status: DC

## 2023-01-21 MED ORDER — NAPROXEN 500 MG PO TABS: 500.0000 mg | ORAL_TABLET | Freq: Two times a day (BID) | ORAL | 0 refills | Status: DC

## 2023-01-21 MED ORDER — METHOCARBAMOL 500 MG PO TABS
1000.0000 mg | ORAL_TABLET | Freq: Once | ORAL | Status: AC
  Administered 2023-01-21: 1000 mg via ORAL
  Filled 2023-01-21: qty 2

## 2023-01-21 MED ORDER — LIDOCAINE 5 % EX PTCH
1.0000 | MEDICATED_PATCH | CUTANEOUS | Status: DC
  Administered 2023-01-21: 1 via TRANSDERMAL
  Filled 2023-01-21: qty 1

## 2023-01-21 NOTE — Discharge Instructions (Addendum)
You came to the emergency department with pain in your upper back.  You have a muscle spasm on the left side and tension in the muscles on your right side.  As we discussed, heat will be better than ice as it will loosen up your muscles and increase the blood flow.  I have sent the following medications to your pharmacy:  Naproxen.  This may be used for pain and inflammation, do not take ibuprofen with this Acetaminophen.  This may be alternated with naproxen for pain Methocarbamol.  This is a muscle relaxant.  Only use as needed and remember it may make you drowsy so do not drive on this medication.  Additionally do not drink alcohol with this Lidocaine patches.  As we discussed these should only be worn for around 12 hours at a time.  You must have 12 hours patch free.  You may use up to 3 patches at the same time  Call your PCP for an appointment this week if you continue to have pain.  Return with any further concerns.  Your work note is attached.  It was a pleasure to meet you and we hope you feel better!

## 2023-01-21 NOTE — ED Provider Notes (Signed)
[facility] Provider Note   CSN: [id number] Arrival date & time: 01/21/23  1232     History  Chief Complaint  Patient presents with   Back Pain    Glenda Mann is a 37 y.o. female presenting today with upper back pain.  Has been going on since Thursday.  She says that the only thing she has lifted is her dog and she lifts dogs all the time.  At first she thought she slept wrong however she continues to have pain despite ibuprofen, Tylenol and her sisters oxycodone.   Back Pain      Home Medications Prior to Admission medications   Medication Sig Start Date End Date Taking? Authorizing Provider  acetaminophen (TYLENOL) 500 MG tablet Take 1 tablet (500 mg total) by mouth every 6 (six) hours as needed. 01/21/23  Yes Alic Hilburn A, PA-C  lidocaine (LIDODERM) 5 % Place 1 patch onto the skin daily. Remove & Discard patch within 12 hours or as directed by MD 01/21/23  Yes Ernst Cumpston A, PA-C  methocarbamol (ROBAXIN) 500 MG tablet Take 1 tablet (500 mg total) by mouth 2 (two) times daily. 01/21/23  Yes Marae Cottrell A, PA-C  naproxen (NAPROSYN) 500 MG tablet Take 1 tablet (500 mg total) by mouth 2 (two) times daily. 01/21/23  Yes Jameyah Fennewald A, PA-C  benzonatate (TESSALON) 100 MG capsule Take 1 capsule (100 mg total) by mouth every 8 (eight) hours. 11/10/21   Malvin Johns, MD  dapagliflozin propanediol (FARXIGA) 5 MG TABS tablet Take 1 tablet by mouth daily. 06/10/21   [provider]  glipiZIDE (GLUCOTROL) 10 MG tablet Take 10 mg by mouth daily before breakfast.    [provider]  levonorgestrel (MIRENA) 20 MCG/24HR IUD by Intrauterine route.    [provider]  metFORMIN (GLUCOPHAGE) 1000 MG tablet Take 1,000 mg by mouth 2 (two) times daily with a meal.    [provider]  nitrofurantoin, macrocrystal-monohydrate, (MACROBID) 100 MG capsule Take 1 capsule (100 mg total) by mouth 2 (two)  times daily. 12/27/22   Small, Brooke L, PA  ondansetron (ZOFRAN ODT) 4 MG disintegrating tablet Take 1 tablet (4 mg total) by mouth every 8 (eight) hours as needed for nausea or vomiting. 12/21/20   Leaphart, Zack Seal, PA-C  phenazopyridine (PYRIDIUM) 200 MG tablet Take 1 tablet (200 mg total) by mouth 3 (three) times daily. 01/07/22   Blanchie Dessert, MD  rosuvastatin (CRESTOR) 20 MG tablet Take 20 mg by mouth daily.    [provider]  Semaglutide (OZEMPIC, 1 MG/DOSE, [place]) Inject 1 mg/mL into the skin once a week.    [provider]  valACYclovir (VALTREX) 1000 MG tablet Take 1 tablet (1,000 mg total) by mouth 3 (three) times daily. 04/11/21   Molpus, Jenny Reichmann, MD      Allergies    Bactrim [sulfamethoxazole-trimethoprim], Penicillins, Reglan [metoclopramide], and Methylphenidate    Review of Systems   Review of Systems  Musculoskeletal:  Positive for back pain.    Physical Exam Updated Vital Signs BP (!) 147/108 (BP Location: Left Arm)   Pulse (!) 107   Temp 98.9 F (37.2 C) (Oral)   Resp 18   Ht 5' 5"$  (1.651 m)   Wt 119.7 kg   SpO2 98%   BMI 43.93 kg/m  Physical Exam Vitals and nursing note reviewed.  Constitutional:      Appearance: Normal appearance.  HENT:     Head: Normocephalic and atraumatic.  Eyes:     General: No scleral icterus.    Conjunctiva/sclera: Conjunctivae normal.  Pulmonary:     Effort: Pulmonary effort is normal. No respiratory distress.  Musculoskeletal:     Comments: Full range of motion of all levels of the spine.  Palpable muscle spasm of the left paraspinal muscles at the level of T2-3.  No midline tenderness.  No obvious deformities or injuries  Skin:    Findings: No rash.  Neurological:     Mental Status: She is alert.  Psychiatric:        Mood and Affect: Mood normal.     ED Results / Procedures / Treatments   Labs (all labs ordered are listed, but only abnormal results are displayed) Labs Reviewed - No data to  display  EKG None  Radiology No results found.  Procedures Procedures   Medications Ordered in ED Medications  lidocaine (LIDODERM) 5 % 1 patch (has no administration in time range)  naproxen (NAPROSYN) tablet 500 mg (has no administration in time range)  methocarbamol (ROBAXIN) tablet 1,000 mg (has no administration in time range)    ED Course/ Medical Decision Making/ A&P                             Medical Decision Making Risk OTC drugs. Prescription drug management.   37 year old female presenting today with upper back pain.  Has been going on since Thursday.  She thought she slept wrong however continues to have pain despite NSAIDs and Tylenol's.  Differential included but was not limited to radiculopathy, muscle spasm, muscle tear, cord compression.  Physical exam.  Full range of motion of all levels of the spine.  Palpable muscle spasm to the left thoracic paraspinals  Treatment: Given Robaxin, naproxen and Lidoderm  MDM/disposition: 37 year old female presenting with upper back pain.  Has been going on since Thursday.  No red flag symptoms.  Appears to be suffering from muscle spasms/tension.  No midline tenderness, do not believe imaging will be of benefit.  Continues to have full range of motion with normal strength in the bilateral upper extremities.  Pain is not radiating, low suspicion for atypical ACS.  I suspect this is musculoskeletal.  Will prescribe naproxen, Tylenol, Robaxin and Lidoderm.  Proper use was discussed.  She can follow-up with her PCP.   Final Clinical Impression(s) / ED Diagnoses Final diagnoses:  Muscle spasm of back    Rx / DC Orders ED Discharge Orders          Ordered    naproxen (NAPROSYN) 500 MG tablet  2 times daily        01/21/23 1351    methocarbamol (ROBAXIN) 500 MG tablet  2 times daily        01/21/23 1351    acetaminophen (TYLENOL) 500 MG tablet  Every 6 hours PRN        01/21/23 1351    lidocaine (LIDODERM) 5 %  Every  24 hours        01/21/23 1351           Results and diagnoses were explained to the patient. Return precautions discussed in full. Patient had no additional questions and expressed complete understanding.   This chart was dictated using voice recognition software.  Despite best efforts to proofread,  errors can occur which can change the documentation meaning.    Rhae Hammock, PA-C 01/21/23 1406    Lennice Sites, DO 01/21/23  1502  

## 2023-01-21 NOTE — ED Triage Notes (Signed)
Pt c/o upper back pain x 2 days. Pt reports that she picked up a dog but she always picks up her dog.

## 2023-01-22 ENCOUNTER — Other Ambulatory Visit: Payer: Self-pay

## 2023-01-22 ENCOUNTER — Encounter (HOSPITAL_BASED_OUTPATIENT_CLINIC_OR_DEPARTMENT_OTHER): Payer: Self-pay | Admitting: Emergency Medicine

## 2023-01-22 ENCOUNTER — Emergency Department (HOSPITAL_BASED_OUTPATIENT_CLINIC_OR_DEPARTMENT_OTHER)
Admission: EM | Admit: 2023-01-22 | Discharge: 2023-01-22 | Disposition: A | Discharge status: Home / Self Care | Payer: 59 | Attending: Emergency Medicine | Admitting: Emergency Medicine

## 2023-01-22 DIAGNOSIS — M62830 Muscle spasm of back: Secondary | ICD-10-CM | POA: Diagnosis not present

## 2023-01-22 DIAGNOSIS — M549 Dorsalgia, unspecified: Secondary | ICD-10-CM | POA: Diagnosis present

## 2023-01-22 DIAGNOSIS — Z7984 Long term (current) use of oral hypoglycemic drugs: Secondary | ICD-10-CM | POA: Diagnosis not present

## 2023-01-22 DIAGNOSIS — E119 Type 2 diabetes mellitus without complications: Secondary | ICD-10-CM | POA: Insufficient documentation

## 2023-01-22 MED ORDER — CYCLOBENZAPRINE HCL 5 MG PO TABS
5.0000 mg | ORAL_TABLET | Freq: Once | ORAL | Status: AC
  Administered 2023-01-22: 5 mg via ORAL
  Filled 2023-01-22: qty 1

## 2023-01-22 MED ORDER — KETOROLAC TROMETHAMINE 15 MG/ML IJ SOLN
15.0000 mg | Freq: Once | INTRAMUSCULAR | Status: AC
  Administered 2023-01-22: 15 mg via INTRAMUSCULAR
  Filled 2023-01-22: qty 1

## 2023-01-22 MED ORDER — LIDOCAINE 5 % EX PTCH
1.0000 | MEDICATED_PATCH | CUTANEOUS | Status: DC
  Administered 2023-01-22: 1 via TRANSDERMAL
  Filled 2023-01-22: qty 1

## 2023-01-22 MED ORDER — ACETAMINOPHEN 500 MG PO TABS
1000.0000 mg | ORAL_TABLET | Freq: Once | ORAL | Status: AC
  Administered 2023-01-22: 1000 mg via ORAL
  Filled 2023-01-22: qty 2

## 2023-01-22 MED ORDER — CYCLOBENZAPRINE HCL 5 MG PO TABS: 5.0000 mg | ORAL_TABLET | Freq: Two times a day (BID) | ORAL | 0 refills | Status: AC | PRN

## 2023-01-22 NOTE — ED Provider Notes (Signed)
[facility] Provider Note   CSN: [id number] Arrival date & time: 01/22/23  1716     History  Chief Complaint  Patient presents with   Back Pain    Glenda Mann is a 37 y.o. female with obesity, T2DM, hidradenitis axillaris who presents with back pain.  Ongoing upper back pain since Thursday. Tylenol/ibuprofen/sister's oxycodoen not helping.  Per chart review was seen here yesterday had exam concerning for muscle spasm of the left paraspinal muscles at level of T2-3.  Was given Robaxin naproxen and Lidoderm and had improvement of her symptoms and was discharged.  She states that her pain is continued and she has severe 10 out of 10 back pain right now, difficult for her to move around.  Denies any chest pain, shortness of breath, pleuritic pain, lower extremity edema, fever/chills, abdominal pain, nausea vomiting diarrhea constipation, radicular pain, urinary symptoms or retention/incontinence, saddle anesthesia.  Has never had this pain before.  No history of IVDU or cancer.  No history of DVT or PE, no recent hospitalizations surgeries or immobilizations, does not take hormones.  Denies any trauma to her back.   Back Pain      Home Medications Prior to Admission medications   Medication Sig Start Date End Date Taking? Authorizing Provider  cyclobenzaprine (FLEXERIL) 5 MG tablet Take 1 tablet (5 mg total) by mouth 2 (two) times daily as needed for up to 6 days for muscle spasms. 01/22/23 01/28/23 Yes Audley Hose, MD  acetaminophen (TYLENOL) 500 MG tablet Take 1 tablet (500 mg total) by mouth every 6 (six) hours as needed. 01/21/23   Redwine, Madison A, PA-C  benzonatate (TESSALON) 100 MG capsule Take 1 capsule (100 mg total) by mouth every 8 (eight) hours. 11/10/21   Malvin Johns, MD  dapagliflozin propanediol (FARXIGA) 5 MG TABS tablet Take 1 tablet by mouth daily. 06/10/21   [provider]  glipiZIDE (GLUCOTROL) 10 MG tablet  Take 10 mg by mouth daily before breakfast.    [provider]  levonorgestrel (MIRENA) 20 MCG/24HR IUD by Intrauterine route.    [provider]  lidocaine (LIDODERM) 5 % Place 1 patch onto the skin daily. Remove & Discard patch within 12 hours or as directed by MD 01/21/23   Redwine, Madison A, PA-C  metFORMIN (GLUCOPHAGE) 1000 MG tablet Take 1,000 mg by mouth 2 (two) times daily with a meal.    [provider]  methocarbamol (ROBAXIN) 500 MG tablet Take 1 tablet (500 mg total) by mouth 2 (two) times daily. 01/21/23   Redwine, Madison A, PA-C  naproxen (NAPROSYN) 500 MG tablet Take 1 tablet (500 mg total) by mouth 2 (two) times daily. 01/21/23   Redwine, Madison A, PA-C  nitrofurantoin, macrocrystal-monohydrate, (MACROBID) 100 MG capsule Take 1 capsule (100 mg total) by mouth 2 (two) times daily. 12/27/22   Small, Brooke L, PA  ondansetron (ZOFRAN ODT) 4 MG disintegrating tablet Take 1 tablet (4 mg total) by mouth every 8 (eight) hours as needed for nausea or vomiting. 12/21/20   Leaphart, Zack Seal, PA-C  phenazopyridine (PYRIDIUM) 200 MG tablet Take 1 tablet (200 mg total) by mouth 3 (three) times daily. 01/07/22   Blanchie Dessert, MD  rosuvastatin (CRESTOR) 20 MG tablet Take 20 mg by mouth daily.    [provider]  Semaglutide (OZEMPIC, 1 MG/DOSE, [place]) Inject 1 mg/mL into the skin once a week.    [provider]  valACYclovir (VALTREX) 1000 MG tablet  Take 1 tablet (1,000 mg total) by mouth 3 (three) times daily. 04/11/21   Molpus, Jenny Reichmann, MD      Allergies    Bactrim [sulfamethoxazole-trimethoprim], Penicillins, Reglan [metoclopramide], and Methylphenidate    Review of Systems   Review of Systems  Musculoskeletal:  Positive for back pain.   Review of systems Negative for f/c.  A 10 point review of systems was performed and is negative unless otherwise reported in HPI.  Physical Exam Updated Vital Signs BP (!) 141/81 (BP Location: Right Arm)   Pulse  92   Temp 97.9 F (36.6 C) (Oral)   Resp 16   Ht '5\' 5"'$  (1.651 m)   Wt 116.1 kg   SpO2 99%   BMI 42.60 kg/m  Physical Exam General: Uncomfortable appearing female, lying in bed.  HEENT: Sclera anicteric, MMM, trachea midline.  Cardiology: RRR, no murmurs/rubs/gallops. BL radial and DP pulses equal bilaterally.  Resp: Normal respiratory rate and effort. CTAB, no wheezes, rhonchi, crackles.  Abd: Soft, non-tender, non-distended. No rebound tenderness or guarding.  GU: Deferred. MSK: No peripheral edema or signs of trauma. Extremities without deformity or TTP. No cyanosis or clubbing. Skin: warm, dry. No rashes or lesions. Back: No midline C/T or L-spine midline tenderness to palpation or step-offs.  No signs of trauma to the back.  Tenderness palpation in the left thoracic paraspinal musculature, left rhomboid/trapezius muscle.  No CVA tenderness. ROM of back limited d/t pain. Neuro: A&Ox4, CNs II-XII grossly intact. 5/5 strength in all extremities. Sensation grossly intact.  Psych: Normal mood and affect.   ED Results / Procedures / Treatments   Labs (all labs ordered are listed, but only abnormal results are displayed) Labs Reviewed - No data to display  EKG None  Radiology No results found.  Procedures Procedures    Medications Ordered in ED Medications  lidocaine (LIDODERM) 5 % 1 patch (1 patch Transdermal Patch Applied 01/22/23 1834)  acetaminophen (TYLENOL) tablet 1,000 mg (1,000 mg Oral Given 01/22/23 1834)  cyclobenzaprine (FLEXERIL) tablet 5 mg (5 mg Oral Given 01/22/23 1834)  ketorolac (TORADOL) 15 MG/ML injection 15 mg (15 mg Intramuscular Given 01/22/23 1835)    ED Course/ Medical Decision Making/ A&P                          Medical Decision Making Risk OTC drugs. Prescription drug management.    MDM:    DDX for low back pain includes but is not limited to:   Consider MSK pain as presenting etiology and back strain. Patient was diagnosed w/ muscle  spasm yesterday and with significant muscular TTP I tend to agree. No back pain red flags on history or physical. Presentation not consistent with malignancy (lack of history of malignancy, lack of B symptoms), fracture (no trauma, no bony tenderness to palpation), cauda equina (no bowel or urinary incontinence/retention, no saddle anesthesia, no distal weakness), AAA, viscus perforation, osteomyelitis or epidural abscess (no IVDU, vertebral tenderness), renal colic, pyelonephritis (afebrile, no CVAT, no urinary symptoms). Given the clinical picture, no indication for imaging at this time. Will treat with tylenol/toradol/flexeril/lidocaine patch and reassess.  8:46 PM Patient's symptoms significantly improved, she appears much more comfortable. Patient will be DC'd with flexeril prescription and instructed not to take Flexeril and Robaxin at the same time.  She is warned as Flexeril can make patient's very drowsy and is warned about driving or operating heavy machinery while taking it.  She is instructed to alternate taking  Tylenol ibuprofen, get massages stretch ice heat and rest.  Patient reports understanding, will pick up the lidocaine patches from her pharmacy that she did not pick up from yesterday.  All questions answered to patient satisfaction, given discharge instructions and specific return precautions.     Additional history obtained from family at bedside.   Reevaluation: After the interventions noted above, I reevaluated the patient and found that they have :improved  Social Determinants of Health: Patient lives independently   Disposition:  DC w/ discharge instructions/return precautions. All questions answered to patient's satisfaction.    Co morbidities that complicate the patient evaluation  Past Medical History:  Diagnosis Date   BV (bacterial vaginosis)    Chlamydia    Gallstones    Gestational diabetes    Hidradenitis suppurativa    HSV (herpes simplex virus)  infection    Shingles    Sleep apnea      Medicines Meds ordered this encounter  Medications   acetaminophen (TYLENOL) tablet 1,000 mg   lidocaine (LIDODERM) 5 % 1 patch   cyclobenzaprine (FLEXERIL) tablet 5 mg   ketorolac (TORADOL) 15 MG/ML injection 15 mg   cyclobenzaprine (FLEXERIL) 5 MG tablet    Sig: Take 1 tablet (5 mg total) by mouth 2 (two) times daily as needed for up to 6 days for muscle spasms.    Dispense:  12 tablet    Refill:  0    I have reviewed the patients home medicines and have made adjustments as needed  Problem List / ED Course: Problem List Items Addressed This Visit   None Visit Diagnoses     Spasm of thoracic back muscle    -  Primary                   This note was created using dictation software, which may contain spelling or grammatical errors.    Audley Hose, MD 01/22/23 [phone]

## 2023-01-22 NOTE — Discharge Instructions (Addendum)
Thank you for coming to [facility] Emergency Department. You were seen for continued back pain. We did an exam, and these showed a thoracic muscle spasm. You can alternate taking Tylenol and ibuprofen as needed for pain. You can take 1,000 mg tylenol (acetaminophen) every 8 hours, and 800 mg ibuprofen every 8 hours. We have prescribed flexeril which you can take 5 mg twice per day as needed. Massage, heat/ice, rest, stretching, and lidocaine patches can also help.  Please follow up with your primary care provider within 1 week.   Do not hesitate to return to the ED or call 911 if you experience: -Worsening symptoms -Chest pain, shortness of breath -Numbness/tingling, fevers/chills, urinary incontinence or bowel incontinence -Lightheadedness, passing out -Fevers/chills -Anything else that concerns you

## 2023-01-22 NOTE — ED Triage Notes (Signed)
Continues to have back pain, medication given is not helping.

## 2023-01-22 NOTE — ED Notes (Signed)
Pt provided with snack and drink with provider approval.

## 2023-02-01 ENCOUNTER — Other Ambulatory Visit: Payer: Self-pay

## 2023-02-01 ENCOUNTER — Emergency Department (HOSPITAL_BASED_OUTPATIENT_CLINIC_OR_DEPARTMENT_OTHER): Payer: 59

## 2023-02-01 ENCOUNTER — Emergency Department (HOSPITAL_BASED_OUTPATIENT_CLINIC_OR_DEPARTMENT_OTHER)
Admission: EM | Admit: 2023-02-01 | Discharge: 2023-02-01 | Disposition: A | Discharge status: Home / Self Care | Payer: 59 | Attending: Emergency Medicine | Admitting: Emergency Medicine

## 2023-02-01 DIAGNOSIS — R0602 Shortness of breath: Secondary | ICD-10-CM | POA: Insufficient documentation

## 2023-02-01 DIAGNOSIS — Z20822 Contact with and (suspected) exposure to covid-19: Secondary | ICD-10-CM | POA: Diagnosis not present

## 2023-02-01 DIAGNOSIS — B3731 Acute candidiasis of vulva and vagina: Secondary | ICD-10-CM | POA: Insufficient documentation

## 2023-02-01 DIAGNOSIS — Z7984 Long term (current) use of oral hypoglycemic drugs: Secondary | ICD-10-CM | POA: Insufficient documentation

## 2023-02-01 DIAGNOSIS — N760 Acute vaginitis: Secondary | ICD-10-CM | POA: Diagnosis not present

## 2023-02-01 DIAGNOSIS — E119 Type 2 diabetes mellitus without complications: Secondary | ICD-10-CM | POA: Diagnosis not present

## 2023-02-01 LAB — TROPONIN I (HIGH SENSITIVITY)
Troponin I (High Sensitivity): 4 ng/L (ref <18)
Troponin I (High Sensitivity): 4 ng/L (ref <18)

## 2023-02-01 LAB — CBC
HCT: 34.5 % — ABNORMAL LOW (ref 36.0–46.0)
Hemoglobin: 11.5 g/dL — ABNORMAL LOW (ref 12.0–15.0)
MCH: 28.3 pg (ref 26.0–34.0)
MCHC: 33.3 g/dL (ref 30.0–36.0)
MCV: 85 fL (ref 80.0–100.0)
Platelets: 273 10*3/uL (ref 150–400)
RBC: 4.06 MIL/uL (ref 3.87–5.11)
RDW: 13.4 % (ref 11.5–15.5)
WBC: 11.6 10*3/uL — ABNORMAL HIGH (ref 4.0–10.5)
nRBC: 0 % (ref 0.0–0.2)

## 2023-02-01 LAB — URINALYSIS, ROUTINE W REFLEX MICROSCOPIC
Bilirubin Urine: NEGATIVE
Glucose, UA: >=500 mg/dL — AB
Hgb urine dipstick: NEGATIVE
Ketones, ur: NEGATIVE mg/dL
Leukocytes,Ua: NEGATIVE
Nitrite: NEGATIVE
Protein, ur: NEGATIVE mg/dL
Specific Gravity, Urine: >=1.03 (ref 1.005–1.030)
pH: 6 (ref 5.0–8.0)

## 2023-02-01 LAB — RESP PANEL BY RT-PCR (RSV, FLU A&B, COVID)  RVPGX2
Influenza A by PCR: NEGATIVE
Influenza B by PCR: NEGATIVE
Resp Syncytial Virus by PCR: NEGATIVE
SARS Coronavirus 2 by RT PCR: NEGATIVE

## 2023-02-01 LAB — BASIC METABOLIC PANEL
Anion gap: 7 (ref 5–15)
BUN: 10 mg/dL (ref 6–20)
CO2: 22 mmol/L (ref 22–32)
Calcium: 8.4 mg/dL — ABNORMAL LOW (ref 8.9–10.3)
Chloride: 102 mmol/L (ref 98–111)
Creatinine, Ser: 0.51 mg/dL (ref 0.44–1.00)
GFR, Estimated: >60 mL/min (ref >60)
Glucose, Bld: 264 mg/dL — ABNORMAL HIGH (ref 70–99)
Potassium: 3.4 mmol/L — ABNORMAL LOW (ref 3.5–5.1)
Sodium: 131 mmol/L — ABNORMAL LOW (ref 135–145)

## 2023-02-01 LAB — URINALYSIS, MICROSCOPIC (REFLEX)

## 2023-02-01 LAB — WET PREP, GENITAL
Sperm: NONE SEEN
Trich, Wet Prep: NONE SEEN
WBC, Wet Prep HPF POC: >=10 — AB (ref <10)

## 2023-02-01 LAB — D-DIMER, QUANTITATIVE: D-Dimer, Quant: <0.27 ug/mL-FEU (ref 0.00–0.50)

## 2023-02-01 MED ORDER — LORAZEPAM 2 MG/ML IJ SOLN
1.0000 mg | Freq: Once | INTRAMUSCULAR | Status: AC
  Administered 2023-02-01: 1 mg via INTRAVENOUS
  Filled 2023-02-01: qty 1

## 2023-02-01 MED ORDER — PROCHLORPERAZINE EDISYLATE 10 MG/2ML IJ SOLN
10.0000 mg | Freq: Once | INTRAMUSCULAR | Status: AC
  Administered 2023-02-01: 10 mg via INTRAVENOUS
  Filled 2023-02-01: qty 2

## 2023-02-01 MED ORDER — KETOROLAC TROMETHAMINE 15 MG/ML IJ SOLN
15.0000 mg | Freq: Once | INTRAMUSCULAR | Status: AC
  Administered 2023-02-01: 15 mg via INTRAVENOUS
  Filled 2023-02-01: qty 1

## 2023-02-01 MED ORDER — SODIUM CHLORIDE 0.9 % IV BOLUS
1000.0000 mL | Freq: Once | INTRAVENOUS | Status: AC
  Administered 2023-02-01: 1000 mL via INTRAVENOUS

## 2023-02-01 MED ORDER — IPRATROPIUM-ALBUTEROL 0.5-2.5 (3) MG/3ML IN SOLN
3.0000 mL | Freq: Once | RESPIRATORY_TRACT | Status: AC
  Administered 2023-02-01: 3 mL via RESPIRATORY_TRACT
  Filled 2023-02-01: qty 3

## 2023-02-01 MED ORDER — ALBUTEROL SULFATE HFA 108 (90 BASE) MCG/ACT IN AERS: 1.0000 | INHALATION_SPRAY | Freq: Four times a day (QID) | RESPIRATORY_TRACT | 0 refills | Status: AC | PRN

## 2023-02-01 MED ORDER — METRONIDAZOLE 500 MG PO TABS: 500.0000 mg | ORAL_TABLET | Freq: Two times a day (BID) | ORAL | 0 refills | Status: DC

## 2023-02-01 MED ORDER — OXYMETAZOLINE HCL 0.05 % NA SOLN
1.0000 | Freq: Once | NASAL | Status: AC
  Administered 2023-02-01: 1 via NASAL
  Filled 2023-02-01: qty 30

## 2023-02-01 MED ORDER — DIPHENHYDRAMINE HCL 50 MG/ML IJ SOLN
25.0000 mg | Freq: Once | INTRAMUSCULAR | Status: AC
  Administered 2023-02-01: 25 mg via INTRAVENOUS
  Filled 2023-02-01: qty 1

## 2023-02-01 MED ORDER — FLUCONAZOLE 150 MG PO TABS: 150.0000 mg | ORAL_TABLET | Freq: Every day | ORAL | 0 refills | Status: DC

## 2023-02-01 NOTE — Discharge Instructions (Addendum)
Note the workup today was overall reassuring.  It does not look like you are having a heart attack, blood clot in the lung, pneumonia.  Symptoms most likely secondary to upper respiratory viral infection.  Given your improvement of symptoms with breathing treatment, we will send in albuterol inhaler.  Recommend continued symptomatic therapy at home with allergy medicine in the form of Zyrtec/Claritin/Allegra, nasal steroid spray in the form of Nasacort/Flonase, Tylenol/Motrin as needed for pain/fever. You tested positive for both yeast infection as well as bacterial vaginosis.  Will treat this with Flagyl and Diflucan as directed.  Follow MyChart for the results of your STD testing. Recommend keeping upcoming PT appointment for your low back pain.  Please do not hesitate to return to emergency department for worrisome signs and symptoms we discussed become apparent.

## 2023-02-01 NOTE — ED Provider Notes (Signed)
[place] [facility] Provider Note   CSN: [id number] Arrival date & time: 02/01/23  1501     History  Chief Complaint  Patient presents with   Shortness of Breath    Glenda Mann is a 37 y.o. female.   Shortness of Breath   37 year old female presents emergency department with myriad of complaints.  Patient reports 2 to 3-day history of cough, nasal congestion, sore throat, mild shortness of breath, some chest heaviness.  Patient reports she works for [employer] with multiple potential sick exposures.  Patient also reports vaginal discharge described as white in appearance for the past 2 to 3 days and is somewhat concerned about STD.  Patient also reports frontal headache that has been present intermittently for the past several days.  Patient additionally reports nosebleed that occurred prior to arrival.  Denies fever, chills, abdominal pain, nausea, vomiting, urinary symptoms, vaginal bleeding, change in bowel habits.  Denies visual disturbance, gait abnormality, slurred speech, facial droop, weakness/sensory deficits in upper extremities.  Past medical history significant for diabetes mellitus, obesity  Home Medications Prior to Admission medications   Medication Sig Start Date End Date Taking? Authorizing Provider  albuterol (VENTOLIN HFA) 108 (90 Base) MCG/ACT inhaler Inhale 1-2 puffs into the lungs every 6 (six) hours as needed for wheezing or shortness of breath. 02/01/23  Yes Dion Saucier A, PA  fluconazole (DIFLUCAN) 150 MG tablet Take 1 tablet (150 mg total) by mouth daily. 02/01/23  Yes Dion Saucier A, PA  metroNIDAZOLE (FLAGYL) 500 MG tablet Take 1 tablet (500 mg total) by mouth 2 (two) times daily. 02/01/23  Yes Dion Saucier A, PA  acetaminophen (TYLENOL) 500 MG tablet Take 1 tablet (500 mg total) by mouth every 6 (six) hours as needed. 01/21/23   Redwine, Madison A, PA-C  benzonatate (TESSALON) 100 MG capsule Take 1 capsule (100 mg  total) by mouth every 8 (eight) hours. 11/10/21   Malvin Johns, MD  dapagliflozin propanediol (FARXIGA) 5 MG TABS tablet Take 1 tablet by mouth daily. 06/10/21   [provider]  glipiZIDE (GLUCOTROL) 10 MG tablet Take 10 mg by mouth daily before breakfast.    [provider]  levonorgestrel (MIRENA) 20 MCG/24HR IUD by Intrauterine route.    [provider]  lidocaine (LIDODERM) 5 % Place 1 patch onto the skin daily. Remove & Discard patch within 12 hours or as directed by MD 01/21/23   Redwine, Madison A, PA-C  metFORMIN (GLUCOPHAGE) 1000 MG tablet Take 1,000 mg by mouth 2 (two) times daily with a meal.    [provider]  methocarbamol (ROBAXIN) 500 MG tablet Take 1 tablet (500 mg total) by mouth 2 (two) times daily. 01/21/23   Redwine, Madison A, PA-C  naproxen (NAPROSYN) 500 MG tablet Take 1 tablet (500 mg total) by mouth 2 (two) times daily. 01/21/23   Redwine, Madison A, PA-C  nitrofurantoin, macrocrystal-monohydrate, (MACROBID) 100 MG capsule Take 1 capsule (100 mg total) by mouth 2 (two) times daily. 12/27/22   Small, Brooke L, PA  ondansetron (ZOFRAN ODT) 4 MG disintegrating tablet Take 1 tablet (4 mg total) by mouth every 8 (eight) hours as needed for nausea or vomiting. 12/21/20   Leaphart, Zack Seal, PA-C  phenazopyridine (PYRIDIUM) 200 MG tablet Take 1 tablet (200 mg total) by mouth 3 (three) times daily. 01/07/22   Blanchie Dessert, MD  rosuvastatin (CRESTOR) 20 MG tablet Take 20 mg by mouth daily.    [provider]  Semaglutide (  OZEMPIC, 1 MG/DOSE, [place]) Inject 1 mg/mL into the skin once a week.    [provider]  valACYclovir (VALTREX) 1000 MG tablet Take 1 tablet (1,000 mg total) by mouth 3 (three) times daily. 04/11/21   Molpus, John, MD      Allergies    Bactrim [sulfamethoxazole-trimethoprim], Penicillins, Reglan [metoclopramide], and Methylphenidate    Review of Systems   Review of Systems  Respiratory:  Positive for shortness  of breath.   All other systems reviewed and are negative.   Physical Exam Updated Vital Signs BP (!) 164/104   Pulse (!) 106   Temp 98.7 F (37.1 C)   Resp 18   Wt 116.1 kg   SpO2 99%   BMI 42.60 kg/m  Physical Exam Vitals and nursing note reviewed.  Constitutional:      General: She is not in acute distress.    Appearance: She is well-developed.  HENT:     Head: Normocephalic and atraumatic.     Mouth/Throat:     Comments: Mild posterior pharyngeal erythema.  Tonsils 1+ bilaterally with no obvious exudate.  Uvula midline rise symmetric with phonation.  No sublingual or submandibular swelling appreciated. Eyes:     Extraocular Movements: Extraocular movements intact.     Conjunctiva/sclera: Conjunctivae normal.     Pupils: Pupils are equal, round, and reactive to light.  Cardiovascular:     Rate and Rhythm: Normal rate and regular rhythm.     Heart sounds: No murmur heard. Pulmonary:     Effort: Pulmonary effort is normal. No respiratory distress.     Comments: Mild diffuse wheeze auscultated in bilateral mid/lower lung fields. Chest:     Chest wall: There is no dullness to percussion.  Abdominal:     Palpations: Abdomen is soft.     Tenderness: There is no abdominal tenderness.  Musculoskeletal:        General: No swelling.     Cervical back: Neck supple.     Comments: 01+ pitting edema bilateral lower extremities.  Skin:    General: Skin is warm and dry.     Capillary Refill: Capillary refill takes less than 2 seconds.  Neurological:     Mental Status: She is alert.     Comments: Alert and oriented to self, place, time and event.   Speech is fluent, clear without dysarthria or dysphasia.   Strength 5/5 in upper/lower extremities   Sensation intact in upper/lower extremities   Normal gait.  Normal finger-to-nose and feet tapping.  CN I not tested  CN II not tested CN III, IV, VI PERRLA and EOMs intact bilaterally  CN V Intact sensation to sharp and light  touch to the face  CN VII facial movements symmetric  CN VIII not tested  CN IX, X no uvula deviation, symmetric rise of soft palate  CN XI 5/5 SCM and trapezius strength bilaterally  CN XII Midline tongue protrusion, symmetric L/R movements     Psychiatric:        Mood and Affect: Mood normal.     ED Results / Procedures / Treatments   Labs (all labs ordered are listed, but only abnormal results are displayed) Labs Reviewed  WET PREP, GENITAL - Abnormal; Notable for the following components:      Result Value   Yeast Wet Prep HPF POC PRESENT (*)    Clue Cells Wet Prep HPF POC PRESENT (*)    WBC, Wet Prep HPF POC >=10 (*)    All other  components within normal limits  BASIC METABOLIC PANEL - Abnormal; Notable for the following components:   Sodium 131 (*)    Potassium 3.4 (*)    Glucose, Bld 264 (*)    Calcium 8.4 (*)    All other components within normal limits  CBC - Abnormal; Notable for the following components:   WBC 11.6 (*)    Hemoglobin 11.5 (*)    HCT 34.5 (*)    All other components within normal limits  URINALYSIS, ROUTINE W REFLEX MICROSCOPIC - Abnormal; Notable for the following components:   APPearance HAZY (*)    Glucose, UA >=500 (*)    All other components within normal limits  URINALYSIS, MICROSCOPIC (REFLEX) - Abnormal; Notable for the following components:   Bacteria, UA RARE (*)    All other components within normal limits  RESP PANEL BY RT-PCR (RSV, FLU A&B, COVID)  RVPGX2  D-DIMER, QUANTITATIVE  RPR  HIV ANTIBODY (ROUTINE TESTING W REFLEX)  GC/CHLAMYDIA PROBE AMP ([place]) NOT AT [facility]  TROPONIN I (HIGH SENSITIVITY)  TROPONIN I (HIGH SENSITIVITY)    EKG None  Radiology DG Chest 2 View  Result Date: 02/01/2023 CLINICAL DATA:  Shortness of breath. EXAM: CHEST - 2 VIEW COMPARISON:  Chest radiograph 07/20/2022, 11/10/2021, 10/16/2020 FINDINGS: Stable mildly enlarged cardiac silhouette. No focal consolidation, pleural effusion, or  pneumothorax. IMPRESSION: No acute cardiopulmonary abnormality.  Stable mild cardiomegaly. Electronically Signed   By: Ileana Roup M.D.   On: 02/01/2023 15:32    Procedures Procedures    Medications Ordered in ED Medications  oxymetazoline (AFRIN) 0.05 % nasal spray 1 spray (1 spray Each Nare Given 02/01/23 1611)  sodium chloride 0.9 % bolus 1,000 mL (0 mLs Intravenous Stopped 02/01/23 1922)  prochlorperazine (COMPAZINE) injection 10 mg (10 mg Intravenous Given 02/01/23 1749)  diphenhydrAMINE (BENADRYL) injection 25 mg (25 mg Intravenous Given 02/01/23 1749)  ketorolac (TORADOL) 15 MG/ML injection 15 mg (15 mg Intravenous Given 02/01/23 1750)  ipratropium-albuterol (DUONEB) 0.5-2.5 (3) MG/3ML nebulizer solution 3 mL (3 mLs Nebulization Given 02/01/23 1707)  LORazepam (ATIVAN) injection 1 mg (1 mg Intravenous Given 02/01/23 1807)    ED Course/ Medical Decision Making/ A&P Clinical Course as of 02/01/23 1933  Thu Feb 01, 2023  1631 DG Chest 2 View [CR]  1656 Discharge x 3 days [CR]    Clinical Course User Index [CR] Wilnette Kales, PA                             Medical Decision Making Amount and/or Complexity of Data Reviewed Labs: ordered. Radiology: ordered. Decision-making details documented in ED Course.  Risk OTC drugs. Prescription drug management.   This patient presents to the ED for concern of influenza-like illness, shortness of breath, vaginal discharge, headache, this involves an extensive number of treatment options, and is a complaint that carries with it a high risk of complications and morbidity.  The differential diagnosis includes flu, COVID, RSV, pneumonia, COPD/asthma exacerbation, chlamydia, gonorrhea, PID, syphilis, HIV, vulvovaginal candidiasis, BV, trichomoniasis,   Co morbidities that complicate the patient evaluation  See HPI   Additional history obtained:  Additional history obtained from EMR External records from outside source obtained and reviewed  including hospital.   Lab Tests:  I Ordered, and personally interpreted labs.  The pertinent results include: Mild leukocytosis of 11.6.  Mellitus anemia with a hemoglobin 11.5.  Platelets within normal range.  Multiple electrolyte abnormalities including hyponatremia, hypokalemia, hypocalcemia  of 131, 3.4 and 8.4 respectively.  No renal dysfunction.  Respiratory viral panel negative.  Initial troponin of 4 with repeat 4; EKG concerning for some tachycardia without acute ischemic changes so doubt ACS.  D-dimer negative so doubt PE.  UA with greater than 500 glucose, rare bacteria with 6-10 squamous epithelial cells.  Wet prep with clue cells present as well as yeast.  GC/chlamydia, HIV and RPR pending.   Imaging Studies ordered:  I ordered imaging studies including chest x-ray I independently visualized and interpreted imaging which showed no acute cardiopulmonary process I agree with the radiologist interpretation   Cardiac Monitoring: / EKG:  The patient was maintained on a cardiac monitor.  I personally viewed and interpreted the cardiac monitored which showed an underlying rhythm of: Sinus tachycardia without acute ischemic changes   Consultations Obtained:  N/a   Problem List / ED Course / Critical interventions / Medication management  Shortness of breath/BV/vulvo vaginal candidiasis/headache I ordered medication including 1 L normal saline, Ativan, Toradol, Benadryl, Compazine, DuoNeb, Afrin   Reevaluation of the patient after these medicines showed that the patient improved I have reviewed the patients home medicines and have made adjustments as needed   Social Determinants of Health:  Some cigarette use.  Denies illicit drug use.   Test / Admission - Considered:  Shortness of breath/BV/vulvo vaginal candidiasis/headache Vitals signs significant for hypertension as well as tachycardia. Otherwise within normal range and stable throughout visit. Laboratory/imaging  studies significant for: See above Patient presents with a myriad of complaints and requesting full workup.   Regarding patient's chest pain: Doubt ACS given delta negative troponin, lack of EKG changes.  Patient has heart score of 0-3 so low suspicion for ACS.  Doubt PE given lack of pleuritic chest pain, risk factors and negative D-dimer.  Doubt aortic dissection, pneumothorax, pneumonia, pericarditis/myocarditis/tamponade.   Regarding patient's headache: Doubt CVA, cerebral venous thrombosis, aortic dissection, pseudotumor cerebri.  Patient noted resolution of headache symptoms with administration of migraine cocktail.   Regarding patient's respiratory symptoms: Symptoms most likely consistent with upper respiratory viral illness.  Given patient's perceived shortness of breath, did notice clinically some wheeze appreciated and patient noted significant improvement with administration of DuoNeb.  Will trial outpatient albuterol inhaler to take as needed for shortness of breath/wheeze.  Additional symptomatic therapy recommended for upper respiratory viral illness of antihistamine, nasal steroid spray and Tylenol/Motrin as needed for pain/fever.   Regarding patient's vaginal discharge: Will send in Flagyl as well as Diflucan for treatment of patient's bacterial vaginosis as well as vulvovaginal candidiasis.  Patient's GC/chlamydia, syphilis, HIV pending.  Recommend following up on MyChart for results for additional treatment if necessary. Patient overall well-appearing, afebrile in no acute distress.  Patient recommended follow-up with primary care for reevaluation of symptoms.  Treatment plan discussed at length with patient and she acknowledged understanding was agreeable to said plan. Worrisome signs and symptoms were discussed with the patient, and the patient acknowledged understanding to return to the ED if noticed. Patient was stable upon discharge.          Final Clinical Impression(s) / ED  Diagnoses Final diagnoses:  Shortness of breath  Bacterial vaginosis  Vulvovaginal candidiasis    Rx / DC Orders ED Discharge Orders          Ordered    metroNIDAZOLE (FLAGYL) 500 MG tablet  2 times daily        02/01/23 1854    albuterol (VENTOLIN HFA) 108 (90 Base) MCG/ACT  inhaler  Every 6 hours PRN        02/01/23 1854    fluconazole (DIFLUCAN) 150 MG tablet  Daily        02/01/23 1854              Wilnette Kales, [place] 02/01/23 1933    Drenda Freeze, MD 02/01/23 [phone]

## 2023-02-01 NOTE — ED Triage Notes (Signed)
Pt arrives with SOB that started 3 days ago. Pt endorses CP, dizziness, and nasal congestion. Per pt, she has also had nosebleeds. No bleeding noted at this time.

## 2023-02-02 LAB — GC/CHLAMYDIA PROBE AMP (~~LOC~~) NOT AT ARMC
Chlamydia: NEGATIVE
Comment: NEGATIVE
Comment: NORMAL
Neisseria Gonorrhea: NEGATIVE

## 2023-02-02 LAB — HIV ANTIBODY (ROUTINE TESTING W REFLEX): HIV Screen 4th Generation wRfx: NONREACTIVE

## 2023-02-02 LAB — RPR: RPR Ser Ql: NONREACTIVE

## 2023-03-12 ENCOUNTER — Emergency Department (HOSPITAL_BASED_OUTPATIENT_CLINIC_OR_DEPARTMENT_OTHER): Payer: 59

## 2023-03-12 ENCOUNTER — Inpatient Hospital Stay (HOSPITAL_BASED_OUTPATIENT_CLINIC_OR_DEPARTMENT_OTHER)
Admission: EM | Admit: 2023-03-12 | Discharge: 2023-03-15 | DRG: 872 | Disposition: A | Discharge status: Home / Self Care | Payer: 59 | Attending: Internal Medicine | Admitting: Internal Medicine

## 2023-03-12 ENCOUNTER — Encounter (HOSPITAL_BASED_OUTPATIENT_CLINIC_OR_DEPARTMENT_OTHER): Payer: Self-pay | Admitting: Emergency Medicine

## 2023-03-12 ENCOUNTER — Other Ambulatory Visit: Payer: Self-pay

## 2023-03-12 DIAGNOSIS — Z79899 Other long term (current) drug therapy: Secondary | ICD-10-CM

## 2023-03-12 DIAGNOSIS — R6520 Severe sepsis without septic shock: Secondary | ICD-10-CM | POA: Diagnosis present

## 2023-03-12 DIAGNOSIS — E785 Hyperlipidemia, unspecified: Secondary | ICD-10-CM | POA: Diagnosis present

## 2023-03-12 DIAGNOSIS — L0390 Cellulitis, unspecified: Secondary | ICD-10-CM | POA: Diagnosis present

## 2023-03-12 DIAGNOSIS — Z1152 Encounter for screening for COVID-19: Secondary | ICD-10-CM

## 2023-03-12 DIAGNOSIS — Z6841 Body Mass Index (BMI) 40.0 and over, adult: Secondary | ICD-10-CM

## 2023-03-12 DIAGNOSIS — L02416 Cutaneous abscess of left lower limb: Secondary | ICD-10-CM | POA: Diagnosis present

## 2023-03-12 DIAGNOSIS — A419 Sepsis, unspecified organism: Secondary | ICD-10-CM | POA: Diagnosis not present

## 2023-03-12 DIAGNOSIS — Z8249 Family history of ischemic heart disease and other diseases of the circulatory system: Secondary | ICD-10-CM

## 2023-03-12 DIAGNOSIS — I10 Essential (primary) hypertension: Secondary | ICD-10-CM | POA: Diagnosis present

## 2023-03-12 DIAGNOSIS — B9689 Other specified bacterial agents as the cause of diseases classified elsewhere: Secondary | ICD-10-CM | POA: Diagnosis present

## 2023-03-12 DIAGNOSIS — L03116 Cellulitis of left lower limb: Secondary | ICD-10-CM | POA: Diagnosis present

## 2023-03-12 DIAGNOSIS — Z7985 Long-term (current) use of injectable non-insulin antidiabetic drugs: Secondary | ICD-10-CM

## 2023-03-12 DIAGNOSIS — E1165 Type 2 diabetes mellitus with hyperglycemia: Secondary | ICD-10-CM | POA: Diagnosis present

## 2023-03-12 DIAGNOSIS — F17210 Nicotine dependence, cigarettes, uncomplicated: Secondary | ICD-10-CM | POA: Diagnosis present

## 2023-03-12 DIAGNOSIS — Z7984 Long term (current) use of oral hypoglycemic drugs: Secondary | ICD-10-CM

## 2023-03-12 DIAGNOSIS — E1142 Type 2 diabetes mellitus with diabetic polyneuropathy: Secondary | ICD-10-CM

## 2023-03-12 DIAGNOSIS — B9562 Methicillin resistant Staphylococcus aureus infection as the cause of diseases classified elsewhere: Secondary | ICD-10-CM | POA: Diagnosis present

## 2023-03-12 DIAGNOSIS — L0291 Cutaneous abscess, unspecified: Principal | ICD-10-CM | POA: Diagnosis present

## 2023-03-12 DIAGNOSIS — L732 Hidradenitis suppurativa: Secondary | ICD-10-CM | POA: Diagnosis present

## 2023-03-12 DIAGNOSIS — G4733 Obstructive sleep apnea (adult) (pediatric): Secondary | ICD-10-CM | POA: Diagnosis present

## 2023-03-12 DIAGNOSIS — Z880 Allergy status to penicillin: Secondary | ICD-10-CM

## 2023-03-12 DIAGNOSIS — B009 Herpesviral infection, unspecified: Secondary | ICD-10-CM | POA: Diagnosis present

## 2023-03-12 DIAGNOSIS — Z888 Allergy status to other drugs, medicaments and biological substances status: Secondary | ICD-10-CM

## 2023-03-12 LAB — CBC WITH DIFFERENTIAL/PLATELET
Abs Immature Granulocytes: 0.05 10*3/uL (ref 0.00–0.07)
Basophils Absolute: 0.1 10*3/uL (ref 0.0–0.1)
Basophils Relative: 0 %
Eosinophils Absolute: 0.2 10*3/uL (ref 0.0–0.5)
Eosinophils Relative: 2 %
HCT: 35.5 % — ABNORMAL LOW (ref 36.0–46.0)
Hemoglobin: 11.3 g/dL — ABNORMAL LOW (ref 12.0–15.0)
Immature Granulocytes: 0 %
Lymphocytes Relative: 22 %
Lymphs Abs: 2.8 10*3/uL (ref 0.7–4.0)
MCH: 27.3 pg (ref 26.0–34.0)
MCHC: 31.8 g/dL (ref 30.0–36.0)
MCV: 85.7 fL (ref 80.0–100.0)
Monocytes Absolute: 0.7 10*3/uL (ref 0.1–1.0)
Monocytes Relative: 6 %
Neutro Abs: 9 10*3/uL — ABNORMAL HIGH (ref 1.7–7.7)
Neutrophils Relative %: 70 %
Platelets: 304 10*3/uL (ref 150–400)
RBC: 4.14 MIL/uL (ref 3.87–5.11)
RDW: 13.2 % (ref 11.5–15.5)
WBC: 12.8 10*3/uL — ABNORMAL HIGH (ref 4.0–10.5)
nRBC: 0 % (ref 0.0–0.2)

## 2023-03-12 LAB — RESP PANEL BY RT-PCR (RSV, FLU A&B, COVID)  RVPGX2
Influenza A by PCR: NEGATIVE
Influenza B by PCR: NEGATIVE
Resp Syncytial Virus by PCR: NEGATIVE
SARS Coronavirus 2 by RT PCR: NEGATIVE

## 2023-03-12 LAB — URINALYSIS, ROUTINE W REFLEX MICROSCOPIC
Bilirubin Urine: NEGATIVE
Glucose, UA: >=500 mg/dL — AB
Ketones, ur: NEGATIVE mg/dL
Leukocytes,Ua: NEGATIVE
Nitrite: NEGATIVE
Protein, ur: NEGATIVE mg/dL
Specific Gravity, Urine: >=1.03 (ref 1.005–1.030)
pH: 5.5 (ref 5.0–8.0)

## 2023-03-12 LAB — COMPREHENSIVE METABOLIC PANEL
ALT: 43 U/L (ref 0–44)
AST: 37 U/L (ref 15–41)
Albumin: 3.4 g/dL — ABNORMAL LOW (ref 3.5–5.0)
Alkaline Phosphatase: 65 U/L (ref 38–126)
Anion gap: 12 (ref 5–15)
BUN: 11 mg/dL (ref 6–20)
CO2: 21 mmol/L — ABNORMAL LOW (ref 22–32)
Calcium: 8.3 mg/dL — ABNORMAL LOW (ref 8.9–10.3)
Chloride: 99 mmol/L (ref 98–111)
Creatinine, Ser: 0.63 mg/dL (ref 0.44–1.00)
GFR, Estimated: >60 mL/min (ref >60)
Glucose, Bld: 318 mg/dL — ABNORMAL HIGH (ref 70–99)
Potassium: 3.8 mmol/L (ref 3.5–5.1)
Sodium: 132 mmol/L — ABNORMAL LOW (ref 135–145)
Total Bilirubin: 0.4 mg/dL (ref 0.3–1.2)
Total Protein: 7.1 g/dL (ref 6.5–8.1)

## 2023-03-12 LAB — URINALYSIS, MICROSCOPIC (REFLEX)

## 2023-03-12 LAB — LACTIC ACID, PLASMA
Lactic Acid, Venous: 2.8 mmol/L (ref 0.5–1.9)
Lactic Acid, Venous: 1.1 mmol/L (ref 0.5–1.9)

## 2023-03-12 LAB — PREGNANCY, URINE: Preg Test, Ur: NEGATIVE

## 2023-03-12 LAB — CBG MONITORING, ED: Glucose-Capillary: 210 mg/dL — ABNORMAL HIGH (ref 70–99)

## 2023-03-12 MED ORDER — IOHEXOL 350 MG/ML SOLN
100.0000 mL | Freq: Once | INTRAVENOUS | Status: AC | PRN
  Administered 2023-03-12: 100 mL via INTRAVENOUS

## 2023-03-12 MED ORDER — SODIUM CHLORIDE 0.9 % IV SOLN
2.0000 g | Freq: Once | INTRAVENOUS | Status: AC
  Administered 2023-03-12: 2 g via INTRAVENOUS
  Filled 2023-03-12: qty 20

## 2023-03-12 MED ORDER — VANCOMYCIN HCL 1250 MG/250ML IV SOLN
1250.0000 mg | Freq: Two times a day (BID) | INTRAVENOUS | Status: DC
  Administered 2023-03-13 – 2023-03-15 (×5): 1250 mg via INTRAVENOUS
  Filled 2023-03-12 (×6): qty 250

## 2023-03-12 MED ORDER — VANCOMYCIN HCL IN DEXTROSE 1-5 GM/200ML-% IV SOLN: 1000.0000 mg | Freq: Once | INTRAVENOUS | Status: DC

## 2023-03-12 MED ORDER — LIDOCAINE-EPINEPHRINE (PF) 2 %-1:200000 IJ SOLN
10.0000 mL | Freq: Once | INTRAMUSCULAR | Status: AC
  Administered 2023-03-12: 10 mL
  Filled 2023-03-12: qty 20

## 2023-03-12 MED ORDER — VANCOMYCIN HCL IN DEXTROSE 1-5 GM/200ML-% IV SOLN
1000.0000 mg | Freq: Once | INTRAVENOUS | Status: AC
  Administered 2023-03-12: 1000 mg via INTRAVENOUS
  Filled 2023-03-12: qty 200

## 2023-03-12 MED ORDER — LACTATED RINGERS IV BOLUS
1000.0000 mL | Freq: Once | INTRAVENOUS | Status: AC
  Administered 2023-03-13: 1000 mL via INTRAVENOUS

## 2023-03-12 MED ORDER — LACTATED RINGERS IV BOLUS (SEPSIS)
1000.0000 mL | Freq: Once | INTRAVENOUS | Status: AC
  Administered 2023-03-12: 1000 mL via INTRAVENOUS

## 2023-03-12 MED ORDER — ONDANSETRON HCL 4 MG/2ML IJ SOLN
4.0000 mg | Freq: Once | INTRAMUSCULAR | Status: AC
  Administered 2023-03-12: 4 mg via INTRAVENOUS
  Filled 2023-03-12: qty 2

## 2023-03-12 MED ORDER — LACTATED RINGERS IV SOLN: INTRAVENOUS | Status: DC

## 2023-03-12 MED ORDER — MORPHINE SULFATE (PF) 4 MG/ML IV SOLN
4.0000 mg | Freq: Once | INTRAVENOUS | Status: AC
  Administered 2023-03-12: 4 mg via INTRAVENOUS
  Filled 2023-03-12: qty 1

## 2023-03-12 MED ORDER — FENTANYL CITRATE PF 50 MCG/ML IJ SOSY
50.0000 ug | PREFILLED_SYRINGE | Freq: Once | INTRAMUSCULAR | Status: DC
  Filled 2023-03-12: qty 1

## 2023-03-12 NOTE — ED Notes (Signed)
Pt remain in CT

## 2023-03-12 NOTE — ED Provider Notes (Signed)
[place] [facility] DEPARTMENT AT [facility] Provider Note   CSN: [id number] Arrival date & time: 03/12/23  1702     History  Chief Complaint  Patient presents with   Abscess   Back Pain    Glenda Mann is a 37 y.o. female.   Abscess Back Pain Associated symptoms: abdominal pain and chest pain      37 year old female with medical history significant for bacterial vaginosis, chlamydia, gallstones, HSV, shingles, OSA on CPAP, hidradenitis axillaris, severe obesity, DM 2 multiple recurrent abscesses who presents to the emergency department with multiple complaints.  The patient states that she developed an abscess on her left medial thigh that subsequently ruptured yesterday and drained and now is having worsening pain and swelling and redness surrounding it.  It is reaccumulating fluid.  She additionally states that she has a history of multiple abscesses and notes 1 underneath her right breast on the right.  She endorses right upper quadrant abdominal pain as well.  She states that she is having mild pleuritic chest discomfort that started today.  She denies any chest pressure, no fevers does endorse chills.  She denies any nausea or vomiting.  No vaginal discharge or bleeding.  She denies any rapid progression of redness or swelling.  Home Medications Prior to Admission medications   Medication Sig Start Date End Date Taking? Authorizing Provider  acetaminophen (TYLENOL) 500 MG tablet Take 1 tablet (500 mg total) by mouth every 6 (six) hours as needed. 01/21/23   Redwine, Madison A, PA-C  albuterol (VENTOLIN HFA) 108 (90 Base) MCG/ACT inhaler Inhale 1-2 puffs into the lungs every 6 (six) hours as needed for wheezing or shortness of breath. 02/01/23   Peter Garter, PA  benzonatate (TESSALON) 100 MG capsule Take 1 capsule (100 mg total) by mouth every 8 (eight) hours. 11/10/21   Rolan Bucco, MD  dapagliflozin propanediol (FARXIGA) 5 MG TABS tablet Take 1 tablet by  mouth daily. 06/10/21   [provider]  fluconazole (DIFLUCAN) 150 MG tablet Take 1 tablet (150 mg total) by mouth daily. 02/01/23   Sherian Maroon A, PA  glipiZIDE (GLUCOTROL) 10 MG tablet Take 10 mg by mouth daily before breakfast.    [provider]  levonorgestrel (MIRENA) 20 MCG/24HR IUD by Intrauterine route.    [provider]  lidocaine (LIDODERM) 5 % Place 1 patch onto the skin daily. Remove & Discard patch within 12 hours or as directed by MD 01/21/23   Redwine, Madison A, PA-C  metFORMIN (GLUCOPHAGE) 1000 MG tablet Take 1,000 mg by mouth 2 (two) times daily with a meal.    [provider]  methocarbamol (ROBAXIN) 500 MG tablet Take 1 tablet (500 mg total) by mouth 2 (two) times daily. 01/21/23   Redwine, Madison A, PA-C  metroNIDAZOLE (FLAGYL) 500 MG tablet Take 1 tablet (500 mg total) by mouth 2 (two) times daily. 02/01/23   Peter Garter, PA  naproxen (NAPROSYN) 500 MG tablet Take 1 tablet (500 mg total) by mouth 2 (two) times daily. 01/21/23   Redwine, Madison A, PA-C  nitrofurantoin, macrocrystal-monohydrate, (MACROBID) 100 MG capsule Take 1 capsule (100 mg total) by mouth 2 (two) times daily. 12/27/22   Small, Brooke L, PA  ondansetron (ZOFRAN ODT) 4 MG disintegrating tablet Take 1 tablet (4 mg total) by mouth every 8 (eight) hours as needed for nausea or vomiting. 12/21/20   Rise Mu, PA-C  phenazopyridine (PYRIDIUM) 200 MG tablet Take 1 tablet (200 mg total)  by mouth 3 (three) times daily. 01/07/22   Gwyneth Sprout, MD  rosuvastatin (CRESTOR) 20 MG tablet Take 20 mg by mouth daily.    [provider]  Semaglutide (OZEMPIC, 1 MG/DOSE, [place]) Inject 1 mg/mL into the skin once a week.    [provider]  valACYclovir (VALTREX) 1000 MG tablet Take 1 tablet (1,000 mg total) by mouth 3 (three) times daily. 04/11/21   Molpus, John, MD      Allergies    Bactrim [sulfamethoxazole-trimethoprim], Penicillins, Reglan [metoclopramide],  and Methylphenidate    Review of Systems   Review of Systems  Cardiovascular:  Positive for chest pain and leg swelling.  Gastrointestinal:  Positive for abdominal pain.  All other systems reviewed and are negative.   Physical Exam Updated Vital Signs BP (!) 143/89   Pulse 96   Temp 98 F (36.7 C) (Oral)   Resp (!) 23   Ht  (1.651 m)   Wt 120.2 kg   SpO2 97%   BMI 44.10 kg/m  Physical Exam Vitals and nursing note reviewed.  Constitutional:      General: She is not in acute distress.    Appearance: She is well-developed.  HENT:     Head: Normocephalic and atraumatic.  Eyes:     Conjunctiva/sclera: Conjunctivae normal.  Cardiovascular:     Rate and Rhythm: Regular rhythm. Tachycardia present.  Pulmonary:     Effort: Pulmonary effort is normal. No respiratory distress.     Breath sounds: Normal breath sounds.  Chest:     Comments: Skin breakdown to the inferior right breast, no clear abscess, no drainage, no surrounding cellulitis Abdominal:     Palpations: Abdomen is soft.     Tenderness: There is abdominal tenderness in the right upper quadrant.     Comments: Right upper quadrant tenderness to palpation.  Right-sided chest wall abscess/cellulitis noted  Musculoskeletal:        General: No swelling.     Cervical back: Neck supple.     Comments: Abscess to the left medial thigh noted, draining purulent fluid, appears to be reaccumulating pus, surrounding cellulitis and tenderness, no crepitus palpated, distal pulses intact.  No pain out of proportion to exam.  Skin:    General: Skin is warm and dry.     Capillary Refill: Capillary refill takes less than 2 seconds.  Neurological:     Mental Status: She is alert.  Psychiatric:        Mood and Affect: Mood normal.     ED Results / Procedures / Treatments   Labs (all labs ordered are listed, but only abnormal results are displayed) Labs Reviewed  LACTIC ACID, PLASMA - Abnormal; Notable for the following  components:      Result Value   Lactic Acid, Venous 2.8 (*)    All other components within normal limits  COMPREHENSIVE METABOLIC PANEL - Abnormal; Notable for the following components:   Sodium 132 (*)    CO2 21 (*)    Glucose, Bld 318 (*)    Calcium 8.3 (*)    Albumin 3.4 (*)    All other components within normal limits  CBC WITH DIFFERENTIAL/PLATELET - Abnormal; Notable for the following components:   WBC 12.8 (*)    Hemoglobin 11.3 (*)    HCT 35.5 (*)    Neutro Abs 9.0 (*)    All other components within normal limits  URINALYSIS, ROUTINE W REFLEX MICROSCOPIC - Abnormal; Notable for the following components:   APPearance  CLOUDY (*)    Glucose, UA >=500 (*)    Hgb urine dipstick TRACE (*)    All other components within normal limits  URINALYSIS, MICROSCOPIC (REFLEX) - Abnormal; Notable for the following components:   Bacteria, UA RARE (*)    All other components within normal limits  CBG MONITORING, ED - Abnormal; Notable for the following components:   Glucose-Capillary 210 (*)    All other components within normal limits  RESP PANEL BY RT-PCR (RSV, FLU A&B, COVID)  RVPGX2  CULTURE, BLOOD (ROUTINE X 2)  CULTURE, BLOOD (ROUTINE X 2)  LACTIC ACID, PLASMA  PREGNANCY, URINE  HCG, QUANTITATIVE, PREGNANCY    EKG EKG Interpretation  Date/Time:  Monday March 12 2023 17:21:52 EDT Ventricular Rate:  106 PR Interval:  145 QRS Duration: 87 QT Interval:  347 QTC Calculation: 461 R Axis:   87 Text Interpretation: Sinus tachycardia Confirmed by Ernie Avena ([id number]) on 03/12/2023 7:39:46 PM  Radiology CT ABDOMEN PELVIS W CONTRAST  Result Date: 03/12/2023 CLINICAL DATA:  Pulmonary embolism suspected. High probability. Right-sided back pain for 2 months. Right upper quadrant pain. Also reports abscess left lateral thigh. EXAM: CT ANGIOGRAPHY CHEST CT ABDOMEN AND PELVIS WITH CONTRAST TECHNIQUE: Multidetector CT imaging of the chest was performed using the standard protocol during  bolus administration of intravenous contrast. Multiplanar CT image reconstructions and MIPs were obtained to evaluate the vascular anatomy. Multidetector CT imaging of the abdomen and pelvis was performed using the standard protocol during bolus administration of intravenous contrast. RADIATION DOSE REDUCTION: This exam was performed according to the departmental dose-optimization program which includes automated exposure control, adjustment of the mA and/or kV according to patient size and/or use of iterative reconstruction technique. CONTRAST:  OMNIPAQUE IOHEXOL 350 MG/ML SOLN COMPARISON:  Portable chest 02/03/2023, PA and lateral chest 02/01/2023, CT without contrast 12/27/2022, and CT with contrast 12/02/2017 FINDINGS: CTA CHEST FINDINGS Cardiovascular: There is mild-to-moderate cardiomegaly with a left chamber predominance. The pulmonary veins are normal caliber. The pulmonary trunk is slightly prominent 3.2 cm indicating arterial hypertension. There is IVC and hepatic vein contrast reflux which could be seen with right heart dysfunction or tricuspid regurgitation, but the right heart chambers are not enlarged. There is a small circumferential pericardial effusion. There are no visible coronary calcific plaques. The aorta and great vessels are normal. The pulmonary arteries are free of thrombotic filling defects through the segmental divisions. The subsegmental arterial bed is unopacified due to bolus timing and not evaluated. A subsegmental embolus could be missed. Mediastinum/Nodes: Moderate elevation right hemidiaphragm. No thyroid or axillary mass is seen. Thoracic esophagus with with a normal wall thickness. The trachea and main bronchi are clear. No intrathoracic adenopathy is seen. Lungs/Pleura: Lungs are clear apart from mild diffuse bronchial thickening. No pleural effusion or pneumothorax. Musculoskeletal: There is a 2 cm subcutaneous cystic lesion, Hounsfield density of -5, in the cleavage area  between the breasts to the left, statistically most likely a sebaceous cyst. Infectious process not excluded. Please correlate clinically. There are no acute or significant osseous findings. The ribcage is intact. Review of the MIP images confirms the above findings. CT ABDOMEN and PELVIS FINDINGS Hepatobiliary: The liver moderately steatotic measuring 19 cm length. There is no mass enhancement. The gallbladder is absent, without biliary dilatation. Pancreas: No focal abnormality, ductal dilatation or inflammatory change. Spleen: No mass.  No splenomegaly. Adrenals/Urinary Tract: No adrenal or renal cortical mass is seen. There is contrast in the renal collecting systems and ureters but there  is no hydronephrosis or visible filling defect. The contrast could obscure small intrarenal stones. The bladder is only partially distended but unremarkable for the degree of distention. Stomach/Bowel: No dilatation or wall thickening including the appendix. Scattered uncomplicated left colonic diverticulosis. Mild-to-moderate constipation. Vascular/Lymphatic: No significant vascular findings are present. No enlarged abdominal or pelvic lymph nodes. There are multiple pelvic phleboliths. Reproductive: The uterus is intact. IUD again noted in expected position. There is a homogeneous 4.7 cm thin walled left ovarian cyst, Hounsfield density of 21, with no adjacent inflammatory reaction or edema. Follow-up pelvic ultrasound recommended if there are localizing symptoms. No follow-up imaging is recommended if there are no localizing symptoms. The right ovary is unremarkable. Other: There is trace fluid in the pelvic cul-de-sac. There is no free hemorrhage, free air, acute inflammatory change or abscess. There is rectus diastasis and small fat hernia at the umbilicus. There is no incarcerated hernia. Musculoskeletal: Mild unilateral chronic right sacroiliitis without erosive changes. No acute or significant osseous findings. Review  of the MIP images confirms the above findings. IMPRESSION: 1. No evidence of arterial embolus through the segmental divisions. The subsegmental arterial bed is unopacified due to bolus timing and not evaluated. 2. Cardiomegaly with small circumferential pericardial effusion. 3. Slight prominence of the pulmonary trunk indicating arterial hypertension. 4. IVC and hepatic vein contrast reflux which could be seen with right heart dysfunction or tricuspid regurgitation, but the right heart chambers are not enlarged. 5. Bronchitis without evidence of pneumonia. 6. 2 cm subcutaneous cystic lesion in the cleavage area between the breasts to the left, statistically most likely a sebaceous cyst. Infectious process not excluded. 7. Constipation and diverticulosis. 8. 4.7 cm thin walled left ovarian cyst. Follow-up pelvic ultrasound recommended if there are localizing symptoms. 9. Trace fluid in the pelvic cul-de-sac, probably physiologic or could be due to cyst leakage. 10. Moderate hepatic steatosis. 11. 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 12. Rectus diastasis and small fat hernia at the umbilicus. Electronically Signed   By: Almira Bar M.D.   On: 03/12/2023 22:19   CT Angio Chest PE W and/or Wo Contrast  Result Date: 03/12/2023 CLINICAL DATA:  Pulmonary embolism suspected. High probability. Right-sided back pain for 2 months. Right upper quadrant pain. Also reports abscess left lateral thigh. EXAM: CT ANGIOGRAPHY CHEST CT ABDOMEN AND PELVIS WITH CONTRAST TECHNIQUE: Multidetector CT imaging of the chest was performed using the standard protocol during bolus administration of intravenous contrast. Multiplanar CT image reconstructions and MIPs were obtained to evaluate the vascular anatomy. Multidetector CT imaging of the abdomen and pelvis was performed using the standard protocol during bolus administration of intravenous contrast. RADIATION DOSE REDUCTION: This exam was performed  according to the departmental dose-optimization program which includes automated exposure control, adjustment of the mA and/or kV according to patient size and/or use of iterative reconstruction technique. CONTRAST:  OMNIPAQUE IOHEXOL 350 MG/ML SOLN COMPARISON:  Portable chest 02/03/2023, PA and lateral chest 02/01/2023, CT without contrast 12/27/2022, and CT with contrast 12/02/2017 FINDINGS: CTA CHEST FINDINGS Cardiovascular: There is mild-to-moderate cardiomegaly with a left chamber predominance. The pulmonary veins are normal caliber. The pulmonary trunk is slightly prominent 3.2 cm indicating arterial hypertension. There is IVC and hepatic vein contrast reflux which could be seen with right heart dysfunction or tricuspid regurgitation, but the right heart chambers are not enlarged. There is a small circumferential pericardial effusion. There are no visible coronary calcific plaques. The aorta and great vessels are normal. The pulmonary arteries are  free of thrombotic filling defects through the segmental divisions. The subsegmental arterial bed is unopacified due to bolus timing and not evaluated. A subsegmental embolus could be missed. Mediastinum/Nodes: Moderate elevation right hemidiaphragm. No thyroid or axillary mass is seen. Thoracic esophagus with with a normal wall thickness. The trachea and main bronchi are clear. No intrathoracic adenopathy is seen. Lungs/Pleura: Lungs are clear apart from mild diffuse bronchial thickening. No pleural effusion or pneumothorax. Musculoskeletal: There is a 2 cm subcutaneous cystic lesion, Hounsfield density of -5, in the cleavage area between the breasts to the left, statistically most likely a sebaceous cyst. Infectious process not excluded. Please correlate clinically. There are no acute or significant osseous findings. The ribcage is intact. Review of the MIP images confirms the above findings. CT ABDOMEN and PELVIS FINDINGS Hepatobiliary: The liver moderately  steatotic measuring 19 cm length. There is no mass enhancement. The gallbladder is absent, without biliary dilatation. Pancreas: No focal abnormality, ductal dilatation or inflammatory change. Spleen: No mass.  No splenomegaly. Adrenals/Urinary Tract: No adrenal or renal cortical mass is seen. There is contrast in the renal collecting systems and ureters but there is no hydronephrosis or visible filling defect. The contrast could obscure small intrarenal stones. The bladder is only partially distended but unremarkable for the degree of distention. Stomach/Bowel: No dilatation or wall thickening including the appendix. Scattered uncomplicated left colonic diverticulosis. Mild-to-moderate constipation. Vascular/Lymphatic: No significant vascular findings are present. No enlarged abdominal or pelvic lymph nodes. There are multiple pelvic phleboliths. Reproductive: The uterus is intact. IUD again noted in expected position. There is a homogeneous 4.7 cm thin walled left ovarian cyst, Hounsfield density of 21, with no adjacent inflammatory reaction or edema. Follow-up pelvic ultrasound recommended if there are localizing symptoms. No follow-up imaging is recommended if there are no localizing symptoms. The right ovary is unremarkable. Other: There is trace fluid in the pelvic cul-de-sac. There is no free hemorrhage, free air, acute inflammatory change or abscess. There is rectus diastasis and small fat hernia at the umbilicus. There is no incarcerated hernia. Musculoskeletal: Mild unilateral chronic right sacroiliitis without erosive changes. No acute or significant osseous findings. Review of the MIP images confirms the above findings. IMPRESSION: 1. No evidence of arterial embolus through the segmental divisions. The subsegmental arterial bed is unopacified due to bolus timing and not evaluated. 2. Cardiomegaly with small circumferential pericardial effusion. 3. Slight prominence of the pulmonary trunk indicating  arterial hypertension. 4. IVC and hepatic vein contrast reflux which could be seen with right heart dysfunction or tricuspid regurgitation, but the right heart chambers are not enlarged. 5. Bronchitis without evidence of pneumonia. 6. 2 cm subcutaneous cystic lesion in the cleavage area between the breasts to the left, statistically most likely a sebaceous cyst. Infectious process not excluded. 7. Constipation and diverticulosis. 8. 4.7 cm thin walled left ovarian cyst. Follow-up pelvic ultrasound recommended if there are localizing symptoms. 9. Trace fluid in the pelvic cul-de-sac, probably physiologic or could be due to cyst leakage. 10. Moderate hepatic steatosis. 11. 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 12. Rectus diastasis and small fat hernia at the umbilicus. Electronically Signed   By: Almira Bar M.D.   On: 03/12/2023 22:19   DG Femur Min 2 Views Left  Result Date: 03/12/2023 CLINICAL DATA:  Right-sided back pain for 2 months. Evaluate for gas forming organisms. Reports fluid-filled abscess on left lateral thigh. EXAM: LEFT FEMUR 2 VIEWS COMPARISON:  CT abdomen and pelvis 03/12/2023 at 9:41 p.m. FINDINGS:  There is no evidence of fracture or other focal bone lesions. Soft tissues are unremarkable. No evidence of soft tissue gas. IMPRESSION: Negative. Electronically Signed   By: Minerva Fester M.D.   On: 03/12/2023 22:11    Procedures .Critical Care  Performed by: Ernie Avena, MD Authorized by: Ernie Avena, MD   Critical care provider statement:    Critical care time (minutes):  30   Critical care was necessary to treat or prevent imminent or life-threatening deterioration of the following conditions:  Sepsis   Critical care was time spent personally by me on the following activities:  Development of treatment plan with patient or surrogate, discussions with consultants, evaluation of patient's response to treatment, examination of patient, ordering and  review of laboratory studies, ordering and review of radiographic studies, ordering and performing treatments and interventions, pulse oximetry, re-evaluation of patient's condition and review of old charts   Care discussed with: admitting provider   .Marland KitchenIncision and Drainage  Date/Time: 03/12/2023 11:32 PM  Performed by: Ernie Avena, MD Authorized by: Ernie Avena, MD   Consent:    Consent obtained:  Verbal   Consent given by:  Patient   Risks discussed:  Bleeding, incomplete drainage and pain Universal protocol:    Patient identity confirmed:  Verbally with patient Location:    Type:  Abscess   Size:  2cm   Location:  Lower extremity   Lower extremity location:  Leg   Leg location:  L upper leg Procedure type:    Complexity:  Simple Procedure details:    Incision types:  Stab incision   Drainage:  Purulent and serous   Drainage amount:  Moderate   Wound treatment:  Wound left open Post-procedure details:    Procedure completion:  Tolerated     Medications Ordered in ED Medications  lactated ringers infusion ( Intravenous New Bag/Given 03/12/23 1935)  lactated ringers bolus 1,000 mL (has no administration in time range)  vancomycin (VANCOREADY) IVPB 1250 mg/250 mL (has no administration in time range)  lactated ringers bolus 1,000 mL (1,000 mLs Intravenous New Bag/Given 03/12/23 1900)  cefTRIAXone (ROCEPHIN) 2 g in sodium chloride 0.9 % 100 mL IVPB (0 g Intravenous Stopped 03/12/23 1945)  vancomycin (VANCOCIN) IVPB 1000 mg/200 mL premix (0 mg Intravenous Stopped 03/12/23 2309)    Followed by  vancomycin (VANCOCIN) IVPB 1000 mg/200 mL premix (0 mg Intravenous Stopped 03/12/23 2204)  morphine (PF) 4 MG/ML injection 4 mg (4 mg Intravenous Given 03/12/23 1923)  iohexol (OMNIPAQUE) 350 MG/ML injection 100 mL (100 mLs Intravenous Contrast Given 03/12/23 2018)  ondansetron (ZOFRAN) injection 4 mg (4 mg Intravenous Given 03/12/23 2114)  lidocaine-EPINEPHrine (XYLOCAINE W/EPI) 2  %-1:200000 (PF) injection 10 mL (10 mLs Infiltration Given 03/12/23 2311)    ED Course/ Medical Decision Making/ A&P Clinical Course as of 03/12/23 2335  Mon Mar 12, 2023  2111 Lactic Acid, Venous(!!): 2.8 [JL]  2111 WBC(!): 12.8 [JL]    Clinical Course User Index [JL] Ernie Avena, MD                             Medical Decision Making Amount and/or Complexity of Data Reviewed Labs: ordered. Decision-making details documented in ED Course. Radiology: ordered.  Risk Prescription drug management. Decision regarding hospitalization.     38 year old female with medical history significant for bacterial vaginosis, chlamydia, gallstones, HSV, shingles, OSA on CPAP, hidradenitis axillaris, severe obesity, DM 2 multiple recurrent abscesses who presents to the  emergency department with multiple complaints.  The patient states that she developed an abscess on her left medial thigh that subsequently ruptured yesterday and drained and now is having worsening pain and swelling and redness surrounding it.  It is reaccumulating fluid.  She additionally states that she has a history of multiple abscesses and notes 1 underneath her right breast on the right.  She endorses right upper quadrant abdominal pain as well.  She states that she is having mild pleuritic chest discomfort that started today.  She denies any chest pressure, no fevers does endorse chills.  She denies any nausea or vomiting.  No vaginal discharge or bleeding.  She denies any rapid progression of redness or swelling.  On arrival, the patient was afebrile, temperature 98.1, tachycardic heart rate 113, not tachypneic RR 18, BP 145/103, saturating 100% on room air, sinus tachycardia noted on cardiac telemetry.  Physical exam significant for multiple abscesses/areas of cellulitis along the patient's body.  She has a history of recurrent abscesses.  She is not currently on antibiotics.  Her left thigh abscess appears to be draining, no  surrounding crepitus, distally neurovascularly intact.  Concern for cellulitis and sepsis from recurrent abscesses.  Code sepsis was called and the patient was administered IV fluids and started on broad-spectrum antibiotics.  Laboratory evaluation significant for CBC with leukocytosis to 12.8, and anemia to 11.3, urinalysis without evidence of UTI, glucosuria present, mild hematuria present, CMP without an anion gap acidosis with a bicarbonate of 21, hyperglycemia to 318 noted, normal renal function, normal liver function, blood cultures x 2 collected prior to antibiotic administration, hCG collected, COVID-19 and influenza PCR testing collected and negative, lactic acid normal at 1.1, urine pregnancy negative.  The patient was covered with broad-spectrum antibiotics include IV vancomycin and IV Rocephin.  She was administered 2 L IV fluid bolus and started on an LR infusion.    CTA PE and CT Abdomen Pelvis: IMPRESSION:  1. No evidence of arterial embolus through the segmental divisions.  The subsegmental arterial bed is unopacified due to bolus timing and  not evaluated.  2. Cardiomegaly with small circumferential pericardial effusion.  3. Slight prominence of the pulmonary trunk indicating arterial  hypertension.  4. IVC and hepatic vein contrast reflux which could be seen with  right heart dysfunction or tricuspid regurgitation, but the right  heart chambers are not enlarged.  5. Bronchitis without evidence of pneumonia.  6. 2 cm subcutaneous cystic lesion in the cleavage area between the  breasts to the left, statistically most likely a sebaceous cyst.  Infectious process not excluded.  7. Constipation and diverticulosis.  8. 4.7 cm thin walled left ovarian cyst. Follow-up pelvic ultrasound  recommended if there are localizing symptoms.  9. Trace fluid in the pelvic cul-de-sac, probably physiologic or  could be due to cyst leakage.  10. Moderate hepatic steatosis.  11. Mild unilateral  chronic right sacroiliitis without erosive  changes.  12. Rectus diastasis and small fat hernia at the umbilicus.    XR Left Femur: Negative  The patient's left thigh abscess was incised and drained with a simple stab incision.  No soft tissue gas, surrounding cellulitis, x-ray without soft tissue gas as well, no crepitus, low concern for necrotizing fasciitis.  She remained hemodynamically stable in the emergency department, initial lactic acid was elevated at 2.8, subsequent reassessment and revealed continued hemodynamic stability, repeat lactic acid was normal at 1.1 after fluid resuscitation.  Blood sugar downtrended to 210 following fluid resuscitation as  well.  Hospitalist medicine was consulted for admission, Dr.Rathore accepting.  Final Clinical Impression(s) / ED Diagnoses Final diagnoses:  Abscess  Cellulitis, unspecified cellulitis site  Sepsis, due to unspecified organism, unspecified whether acute organ dysfunction present    Rx / DC Orders ED Discharge Orders     None         Ernie Avena, MD 03/12/23 2335

## 2023-03-12 NOTE — Progress Notes (Signed)
Pharmacy Antibiotic Note  Glenda Mann is a 37 y.o. female for which pharmacy has been consulted for vancomycin dosing for cellulitis.  SCr 0.63 WBC 12.8; LA 2.8; T 98; HR 105; RR 17 COVID neg / flu neg  Plan: Ceftriaxone per MD Vancomycin 2000 mg once then 1250 mg q12hr (eAUC 535.3) unless change in renal function Trend WBC, Fever, Renal function F/u cultures, clinical course, WBC De-escalate when able Levels at steady state  Height: 5\' 5"  (165.1 cm) Weight: 120.2 kg (265 lb) IBW/kg (Calculated) : 57  Temp (24hrs), Avg:99.1 F (37.3 C), Min:99.1 F (37.3 C), Max:99.1 F (37.3 C)  Recent Labs  Lab 03/12/23 1716  WBC 12.8*  CREATININE 0.63  LATICACIDVEN 2.8*    Estimated Creatinine Clearance: 127.5 mL/min (by C-G formula based on SCr of 0.63 mg/dL).    Allergies  Allergen Reactions   Bactrim [Sulfamethoxazole-Trimethoprim]    Penicillins     Family history   Reglan [Metoclopramide]     Developed restlessness after IV Reglan given.   Methylphenidate Anxiety    Jittery   Microbiology results: Pending  Thank you for allowing pharmacy to be a part of this patient's care.  Delmar Landau, PharmD, BCPS 03/12/2023 6:29 PM ED Clinical Pharmacist -  [phone]

## 2023-03-12 NOTE — Plan of Care (Signed)
37 year old female with past medical history of gallstones, gestational diabetes, hidradenitis suppurativa, HSV, shingles, sleep apnea, chlamydia, bacterial vaginosis presented to ED with left thigh abscess related to her history of hidradenitis suppurativa.  Abscess had ruptured yesterday and has some signs of surrounding cellulitis.  ED physician is able to appreciate an area which is still not draining and will do bedside I&D.  No crepitus on exam.  Patient also complained of right-sided abdominal pain and pleuritic chest pain.  In the ED, temperature 99.1 F and tachycardic to the 110s on arrival.  Not hypotensive.  Labs showing WBC 12.8, hemoglobin 11.3, sodium 132, bicarb 21, anion gap 12, glucose 318, BUN 11, creatinine 0.6, normal LFTs, lactic acid 2.8> 1.1, UA without signs of infection.  Blood cultures drawn.  X-ray of left femur negative for soft tissue gas.  CT chest/abdomen/pelvis with contrast: "IMPRESSION: 1. No evidence of arterial embolus through the segmental divisions. The subsegmental arterial bed is unopacified due to bolus timing and not evaluated. 2. Cardiomegaly with small circumferential pericardial effusion. 3. Slight prominence of the pulmonary trunk indicating arterial hypertension. 4. IVC and hepatic vein contrast reflux which could be seen with right heart dysfunction or tricuspid regurgitation, but the right heart chambers are not enlarged. 5. Bronchitis without evidence of pneumonia. 6. 2 cm subcutaneous cystic lesion in the cleavage area between the breasts to the left, statistically most likely a sebaceous cyst. Infectious process not excluded. 7. Constipation and diverticulosis. 8. 4.7 cm thin walled left ovarian cyst. Follow-up pelvic ultrasound recommended if there are localizing symptoms. 9. Trace fluid in the pelvic cul-de-sac, probably physiologic or could be due to cyst leakage. 10. Moderate hepatic steatosis. 11. Mild unilateral chronic right  sacroiliitis without erosive changes. 12. Rectus diastasis and small fat hernia at the umbilicus."  Patient received fentanyl, morphine, Zofran, vancomycin, ceftriaxone, and 2 L IV fluid boluses in the ED. Admission requested for sepsis secondary to left thigh abscess/cellulitis.  [facility] will assume care on arrival to accepting facility. Until arrival, care as per EDP. However, [facility] available 24/7 for questions and assistance.  Nursing staff, please page [facility] Admits and Consults [phone]) as soon as the patient arrives to the hospital.

## 2023-03-12 NOTE — ED Notes (Signed)
Pt to get 2 grams of Vanc per pharmacy

## 2023-03-12 NOTE — Sepsis Progress Note (Signed)
Elink monitoring for the code sepsis protocol.  

## 2023-03-12 NOTE — ED Triage Notes (Signed)
Pt arrives pov, steady gait, c/o RT side back pain x 2 months Endorses getting phys. therapy with no relief. Also reports fluid filled abscess on LT lateral thigh, and RUQ. Hydrocodone at 0730 with mild relief

## 2023-03-13 DIAGNOSIS — E785 Hyperlipidemia, unspecified: Secondary | ICD-10-CM | POA: Diagnosis present

## 2023-03-13 DIAGNOSIS — E1165 Type 2 diabetes mellitus with hyperglycemia: Secondary | ICD-10-CM | POA: Diagnosis present

## 2023-03-13 DIAGNOSIS — Z79899 Other long term (current) drug therapy: Secondary | ICD-10-CM | POA: Diagnosis not present

## 2023-03-13 DIAGNOSIS — Z7984 Long term (current) use of oral hypoglycemic drugs: Secondary | ICD-10-CM | POA: Diagnosis not present

## 2023-03-13 DIAGNOSIS — Z888 Allergy status to other drugs, medicaments and biological substances status: Secondary | ICD-10-CM | POA: Diagnosis not present

## 2023-03-13 DIAGNOSIS — L02416 Cutaneous abscess of left lower limb: Secondary | ICD-10-CM | POA: Diagnosis present

## 2023-03-13 DIAGNOSIS — L03116 Cellulitis of left lower limb: Secondary | ICD-10-CM | POA: Diagnosis present

## 2023-03-13 DIAGNOSIS — L732 Hidradenitis suppurativa: Secondary | ICD-10-CM | POA: Diagnosis present

## 2023-03-13 DIAGNOSIS — Z8249 Family history of ischemic heart disease and other diseases of the circulatory system: Secondary | ICD-10-CM | POA: Diagnosis not present

## 2023-03-13 DIAGNOSIS — G4733 Obstructive sleep apnea (adult) (pediatric): Secondary | ICD-10-CM | POA: Diagnosis present

## 2023-03-13 DIAGNOSIS — B9562 Methicillin resistant Staphylococcus aureus infection as the cause of diseases classified elsewhere: Secondary | ICD-10-CM | POA: Diagnosis present

## 2023-03-13 DIAGNOSIS — L0291 Cutaneous abscess, unspecified: Principal | ICD-10-CM | POA: Diagnosis present

## 2023-03-13 DIAGNOSIS — R6520 Severe sepsis without septic shock: Secondary | ICD-10-CM | POA: Diagnosis present

## 2023-03-13 DIAGNOSIS — Z7985 Long-term (current) use of injectable non-insulin antidiabetic drugs: Secondary | ICD-10-CM | POA: Diagnosis not present

## 2023-03-13 DIAGNOSIS — Z1152 Encounter for screening for COVID-19: Secondary | ICD-10-CM | POA: Diagnosis not present

## 2023-03-13 DIAGNOSIS — A419 Sepsis, unspecified organism: Secondary | ICD-10-CM | POA: Diagnosis present

## 2023-03-13 DIAGNOSIS — I10 Essential (primary) hypertension: Secondary | ICD-10-CM | POA: Diagnosis present

## 2023-03-13 DIAGNOSIS — Z880 Allergy status to penicillin: Secondary | ICD-10-CM | POA: Diagnosis not present

## 2023-03-13 DIAGNOSIS — F17210 Nicotine dependence, cigarettes, uncomplicated: Secondary | ICD-10-CM | POA: Diagnosis present

## 2023-03-13 DIAGNOSIS — B009 Herpesviral infection, unspecified: Secondary | ICD-10-CM | POA: Diagnosis present

## 2023-03-13 DIAGNOSIS — Z6841 Body Mass Index (BMI) 40.0 and over, adult: Secondary | ICD-10-CM | POA: Diagnosis not present

## 2023-03-13 DIAGNOSIS — B9689 Other specified bacterial agents as the cause of diseases classified elsewhere: Secondary | ICD-10-CM | POA: Diagnosis present

## 2023-03-13 LAB — BLOOD CULTURE ID PANEL (REFLEXED) - BCID2

## 2023-03-13 LAB — BASIC METABOLIC PANEL
Anion gap: 9 (ref 5–15)
BUN: 9 mg/dL (ref 6–20)
CO2: 23 mmol/L (ref 22–32)
Calcium: 8.2 mg/dL — ABNORMAL LOW (ref 8.9–10.3)
Chloride: 103 mmol/L (ref 98–111)
Creatinine, Ser: 0.55 mg/dL (ref 0.44–1.00)
GFR, Estimated: >60 mL/min (ref >60)
Glucose, Bld: 177 mg/dL — ABNORMAL HIGH (ref 70–99)
Potassium: 3.9 mmol/L (ref 3.5–5.1)
Sodium: 135 mmol/L (ref 135–145)

## 2023-03-13 LAB — CBC
HCT: 33.5 % — ABNORMAL LOW (ref 36.0–46.0)
Hemoglobin: 10.7 g/dL — ABNORMAL LOW (ref 12.0–15.0)
MCH: 27.9 pg (ref 26.0–34.0)
MCHC: 31.9 g/dL (ref 30.0–36.0)
MCV: 87.2 fL (ref 80.0–100.0)
Platelets: 289 10*3/uL (ref 150–400)
RBC: 3.84 MIL/uL — ABNORMAL LOW (ref 3.87–5.11)
RDW: 13.2 % (ref 11.5–15.5)
WBC: 13.5 10*3/uL — ABNORMAL HIGH (ref 4.0–10.5)
nRBC: 0 % (ref 0.0–0.2)

## 2023-03-13 LAB — HEMOGLOBIN A1C
Hgb A1c MFr Bld: 8.2 % — ABNORMAL HIGH (ref 4.8–5.6)
Mean Plasma Glucose: 188.64 mg/dL

## 2023-03-13 LAB — GLUCOSE, CAPILLARY
Glucose-Capillary: 163 mg/dL — ABNORMAL HIGH (ref 70–99)
Glucose-Capillary: 204 mg/dL — ABNORMAL HIGH (ref 70–99)
Glucose-Capillary: 293 mg/dL — ABNORMAL HIGH (ref 70–99)
Glucose-Capillary: 216 mg/dL — ABNORMAL HIGH (ref 70–99)
Glucose-Capillary: 337 mg/dL — ABNORMAL HIGH (ref 70–99)

## 2023-03-13 LAB — PHOSPHORUS: Phosphorus: 4.3 mg/dL (ref 2.5–4.6)

## 2023-03-13 LAB — HCG, QUANTITATIVE, PREGNANCY: hCG, Beta Chain, Quant, S: <1 m[IU]/mL (ref <5)

## 2023-03-13 LAB — CULTURE, BLOOD (ROUTINE X 2)

## 2023-03-13 LAB — MAGNESIUM: Magnesium: 1.6 mg/dL — ABNORMAL LOW (ref 1.7–2.4)

## 2023-03-13 LAB — MRSA NEXT GEN BY PCR, NASAL: MRSA by PCR Next Gen: DETECTED — AB

## 2023-03-13 MED ORDER — ALBUTEROL SULFATE (2.5 MG/3ML) 0.083% IN NEBU: 2.5000 mg | INHALATION_SOLUTION | Freq: Four times a day (QID) | RESPIRATORY_TRACT | Status: DC | PRN

## 2023-03-13 MED ORDER — ROSUVASTATIN CALCIUM 10 MG PO TABS
20.0000 mg | ORAL_TABLET | Freq: Every day | ORAL | Status: DC
  Administered 2023-03-13 – 2023-03-15 (×3): 20 mg via ORAL
  Filled 2023-03-13 (×3): qty 2

## 2023-03-13 MED ORDER — HYDROMORPHONE HCL 1 MG/ML IJ SOLN
0.5000 mg | INTRAMUSCULAR | Status: AC | PRN
  Administered 2023-03-13 (×2): 0.5 mg via INTRAVENOUS
  Filled 2023-03-13 (×3): qty 0.5

## 2023-03-13 MED ORDER — SODIUM CHLORIDE 0.9 % IV SOLN
2.0000 g | INTRAVENOUS | Status: DC
  Administered 2023-03-13: 2 g via INTRAVENOUS
  Filled 2023-03-13: qty 20

## 2023-03-13 MED ORDER — ALBUTEROL SULFATE HFA 108 (90 BASE) MCG/ACT IN AERS: 1.0000 | INHALATION_SPRAY | Freq: Four times a day (QID) | RESPIRATORY_TRACT | Status: DC | PRN

## 2023-03-13 MED ORDER — FLUTICASONE PROPIONATE 50 MCG/ACT NA SUSP
1.0000 | Freq: Every day | NASAL | Status: DC
  Administered 2023-03-13 – 2023-03-15 (×3): 1 via NASAL
  Filled 2023-03-13: qty 16

## 2023-03-13 MED ORDER — CHLORHEXIDINE GLUCONATE CLOTH 2 % EX PADS
6.0000 | MEDICATED_PAD | Freq: Every day | CUTANEOUS | Status: DC
  Administered 2023-03-14 – 2023-03-15 (×2): 6 via TOPICAL

## 2023-03-13 MED ORDER — MORPHINE SULFATE (PF) 4 MG/ML IV SOLN
4.0000 mg | Freq: Once | INTRAVENOUS | Status: AC
  Administered 2023-03-13: 4 mg via INTRAVENOUS
  Filled 2023-03-13: qty 1

## 2023-03-13 MED ORDER — METRONIDAZOLE 0.75 % VA GEL
1.0000 | Freq: Every day | VAGINAL | Status: DC
  Administered 2023-03-13 – 2023-03-14 (×2): 1 via VAGINAL
  Filled 2023-03-13: qty 70

## 2023-03-13 MED ORDER — INSULIN ASPART 100 UNIT/ML IJ SOLN
0.0000 [IU] | Freq: Every day | INTRAMUSCULAR | Status: DC
  Administered 2023-03-13: 4 [IU] via SUBCUTANEOUS
  Administered 2023-03-14: 3 [IU] via SUBCUTANEOUS

## 2023-03-13 MED ORDER — ENOXAPARIN SODIUM 60 MG/0.6ML IJ SOSY
60.0000 mg | PREFILLED_SYRINGE | INTRAMUSCULAR | Status: DC
  Administered 2023-03-13 – 2023-03-15 (×3): 60 mg via SUBCUTANEOUS
  Filled 2023-03-13 (×3): qty 0.6

## 2023-03-13 MED ORDER — POLYETHYLENE GLYCOL 3350 17 G PO PACK: 17.0000 g | PACK | Freq: Every day | ORAL | Status: DC | PRN

## 2023-03-13 MED ORDER — ROSUVASTATIN CALCIUM 10 MG PO TABS: 20.0000 mg | ORAL_TABLET | Freq: Every day | ORAL | Status: DC

## 2023-03-13 MED ORDER — MUPIROCIN 2 % EX OINT
1.0000 | TOPICAL_OINTMENT | Freq: Two times a day (BID) | CUTANEOUS | Status: DC
  Administered 2023-03-13 – 2023-03-15 (×5): 1 via NASAL
  Filled 2023-03-13 (×2): qty 22

## 2023-03-13 MED ORDER — ACETAMINOPHEN 325 MG PO TABS
650.0000 mg | ORAL_TABLET | Freq: Four times a day (QID) | ORAL | Status: DC | PRN
  Administered 2023-03-13: 650 mg via ORAL
  Filled 2023-03-13: qty 2

## 2023-03-13 MED ORDER — PROCHLORPERAZINE EDISYLATE 10 MG/2ML IJ SOLN: 5.0000 mg | Freq: Four times a day (QID) | INTRAMUSCULAR | Status: DC | PRN

## 2023-03-13 MED ORDER — LIDOCAINE 5 % EX PTCH
2.0000 | MEDICATED_PATCH | CUTANEOUS | Status: DC
  Administered 2023-03-13 – 2023-03-14 (×2): 2 via TRANSDERMAL
  Filled 2023-03-13 (×2): qty 2

## 2023-03-13 MED ORDER — OXYCODONE HCL 5 MG PO TABS
5.0000 mg | ORAL_TABLET | Freq: Four times a day (QID) | ORAL | Status: AC | PRN
  Administered 2023-03-13 (×4): 5 mg via ORAL
  Filled 2023-03-13 (×4): qty 1

## 2023-03-13 MED ORDER — GUAIFENESIN-DM 100-10 MG/5ML PO SYRP
5.0000 mL | ORAL_SOLUTION | ORAL | Status: DC | PRN
  Administered 2023-03-13 – 2023-03-15 (×8): 5 mL via ORAL
  Filled 2023-03-13 (×8): qty 5

## 2023-03-13 MED ORDER — FLUCONAZOLE 150 MG PO TABS
150.0000 mg | ORAL_TABLET | Freq: Every day | ORAL | Status: DC
  Administered 2023-03-13 – 2023-03-15 (×3): 150 mg via ORAL
  Filled 2023-03-13 (×3): qty 1

## 2023-03-13 MED ORDER — MELATONIN 5 MG PO TABS
5.0000 mg | ORAL_TABLET | Freq: Every evening | ORAL | Status: DC | PRN
  Administered 2023-03-13 – 2023-03-14 (×3): 5 mg via ORAL
  Filled 2023-03-13 (×3): qty 1

## 2023-03-13 MED ORDER — INSULIN ASPART 100 UNIT/ML IJ SOLN
0.0000 [IU] | Freq: Three times a day (TID) | INTRAMUSCULAR | Status: DC
  Administered 2023-03-13 (×2): 3 [IU] via SUBCUTANEOUS
  Administered 2023-03-13: 5 [IU] via SUBCUTANEOUS
  Administered 2023-03-14 (×3): 3 [IU] via SUBCUTANEOUS
  Administered 2023-03-15: 5 [IU] via SUBCUTANEOUS
  Administered 2023-03-15: 7 [IU] via SUBCUTANEOUS

## 2023-03-13 MED ORDER — LACTATED RINGERS IV SOLN: INTRAVENOUS | Status: DC

## 2023-03-13 NOTE — H&P (Signed)
History and Physical  Glenda Mann FAO:[id number] DOB: 05/08/1986 DOA: 03/12/2023  Referring physician: Accepted by Dr. Loney Loh [facility], Hospitalist service.  PCP: Drema Halon, FNP  Outpatient Specialists: None Patient coming from: Home through [facility] ED  Chief Complaint: Left thigh abscess  HPI: Glenda Mann is a 37 y.o. female with medical history significant for bacterial vaginosis, chlamydia, HSV, hidradenitis axillaris, morbid obesity, type 2 diabetes, OSA on CPAP, asthma, multiple recurrent abscesses, who initially presented to [facility] ED with complaints of an abscess on her left anterior thigh that ruptured yesterday.  Associated with severe pain, swelling and redness around it.  Also endorses right upper quadrant abdominal pain worse with taking a deep breath which started today.  No subjective fevers, admits to intermittent chills.  In the ED, afebrile, with sinus tachycardia, pulse of 113.  Due to concern for cellulitis and sepsis from recurrent abscesses, code sepsis was called.  Peripheral blood cultures x 2 were obtained.    The patient received broad-spectrum IV antibiotics IV vancomycin and Rocephin, along with IV fluid per sepsis protocol.  [facility], hospitalist service, was asked to admit.  The patient was accepted by Dr. Loney Loh, [facility], to [facility] telemetry unit as observation status.  ED Course: BP 121/72, pulse 107.  Respiratory rate 25, O2 saturation 95% on room air.  Lab studies markable for WBC 12.8, hemoglobin 11.3, neutrophil count 9.0.  Serum sodium 132, serum bicarb 21, glucose 318, albumin 3.4.  Initial lactic acid 2.8, repeat 1.1.  Review of Systems: Review of systems as noted in the HPI. All other systems reviewed and are negative.   Past Medical History:  Diagnosis Date   BV (bacterial vaginosis)    Chlamydia    Gallstones    Gestational diabetes    Hidradenitis suppurativa    HSV (herpes simplex virus) infection    Shingles    Sleep apnea     Past Surgical History:  Procedure Laterality Date   CHOLECYSTECTOMY     INCISE AND DRAIN ABCESS      Social History:  reports that she has been smoking cigarettes. She has been smoking an average of .5 packs per day. She has never used smokeless tobacco. She reports current alcohol use. She reports that she does not use drugs.   Allergies  Allergen Reactions   Bactrim [Sulfamethoxazole-Trimethoprim]    Penicillins     Family history   Reglan [Metoclopramide]     Developed restlessness after IV Reglan given.   Methylphenidate Anxiety    Jittery    Family History  Problem Relation Age of Onset   Migraines Mother    Hypertension Maternal Grandmother    Breast cancer Paternal Grandmother       Prior to Admission medications   Medication Sig Start Date End Date Taking? Authorizing Provider  acetaminophen (TYLENOL) 500 MG tablet Take 1 tablet (500 mg total) by mouth every 6 (six) hours as needed. 01/21/23   Redwine, Madison A, PA-C  albuterol (VENTOLIN HFA) 108 (90 Base) MCG/ACT inhaler Inhale 1-2 puffs into the lungs every 6 (six) hours as needed for wheezing or shortness of breath. 02/01/23   Peter Garter, PA  benzonatate (TESSALON) 100 MG capsule Take 1 capsule (100 mg total) by mouth every 8 (eight) hours. 11/10/21   Rolan Bucco, MD  dapagliflozin propanediol (FARXIGA) 5 MG TABS tablet Take 1 tablet by mouth daily. 06/10/21   [provider]  fluconazole (DIFLUCAN) 150 MG tablet Take 1 tablet (150 mg total) by  mouth daily. 02/01/23   Sherian Maroon A, PA  glipiZIDE (GLUCOTROL) 10 MG tablet Take 10 mg by mouth daily before breakfast.    [provider]  levonorgestrel (MIRENA) 20 MCG/24HR IUD by Intrauterine route.    [provider]  lidocaine (LIDODERM) 5 % Place 1 patch onto the skin daily. Remove & Discard patch within 12 hours or as directed by MD 01/21/23   Redwine, Madison A, PA-C  metFORMIN (GLUCOPHAGE) 1000 MG tablet Take 1,000 mg by mouth  2 (two) times daily with a meal.    [provider]  methocarbamol (ROBAXIN) 500 MG tablet Take 1 tablet (500 mg total) by mouth 2 (two) times daily. 01/21/23   Redwine, Madison A, PA-C  metroNIDAZOLE (FLAGYL) 500 MG tablet Take 1 tablet (500 mg total) by mouth 2 (two) times daily. 02/01/23   Peter Garter, PA  naproxen (NAPROSYN) 500 MG tablet Take 1 tablet (500 mg total) by mouth 2 (two) times daily. 01/21/23   Redwine, Madison A, PA-C  nitrofurantoin, macrocrystal-monohydrate, (MACROBID) 100 MG capsule Take 1 capsule (100 mg total) by mouth 2 (two) times daily. 12/27/22   Small, Brooke L, PA  ondansetron (ZOFRAN ODT) 4 MG disintegrating tablet Take 1 tablet (4 mg total) by mouth every 8 (eight) hours as needed for nausea or vomiting. 12/21/20   Leaphart, Lynann Beaver, PA-C  phenazopyridine (PYRIDIUM) 200 MG tablet Take 1 tablet (200 mg total) by mouth 3 (three) times daily. 01/07/22   Gwyneth Sprout, MD  rosuvastatin (CRESTOR) 20 MG tablet Take 20 mg by mouth daily.    [provider]  Semaglutide (OZEMPIC, 1 MG/DOSE, [place]) Inject 1 mg/mL into the skin once a week.    [provider]  valACYclovir (VALTREX) 1000 MG tablet Take 1 tablet (1,000 mg total) by mouth 3 (three) times daily. 04/11/21   Molpus, John, MD    Physical Exam: BP 121/72   Pulse 99   Temp 98.2 F (36.8 C) (Oral)   Resp (!) 24   Ht  (1.651 m)   Wt 120.2 kg   SpO2 98%   BMI 44.10 kg/m   General: 37 y.o. year-old female well developed well nourished in no acute distress.  Alert and oriented x3. Cardiovascular: Tachycardic with no rubs or gallops.  No thyromegaly or JVD noted.  No lower extremity edema. 2/4 pulses in all 4 extremities. Respiratory: Clear to auscultation with no wheezes or rales. Good inspiratory effort. Abdomen: Soft nontender nondistended with normal bowel sounds x4 quadrants. Muskuloskeletal: No cyanosis, clubbing or edema noted bilaterally Neuro: CN II-XII intact, strength,  sensation, reflexes Skin: Left anterior thigh incised and drained abscess with erythema surrounding lesion. Psychiatry: Judgement and insight appear normal. Mood is appropriate for condition and setting          Labs on Admission:  Basic Metabolic Panel: Recent Labs  Lab 03/12/23 1716  NA 132*  K 3.8  CL 99  CO2 21*  GLUCOSE 318*  BUN 11  CREATININE 0.63  CALCIUM 8.3*   Liver Function Tests: Recent Labs  Lab 03/12/23 1716  AST 37  ALT 43  ALKPHOS 65  BILITOT 0.4  PROT 7.1  ALBUMIN 3.4*   No results for input(s): "LIPASE", "AMYLASE" in the last 168 hours. No results for input(s): "AMMONIA" in the last 168 hours. CBC: Recent Labs  Lab 03/12/23 1716  WBC 12.8*  NEUTROABS 9.0*  HGB 11.3*  HCT 35.5*  MCV 85.7  PLT 304   Cardiac Enzymes:  No results for input(s): "CKTOTAL", "CKMB", "CKMBINDEX", "TROPONINI" in the last 168 hours.  BNP (last 3 results) No results for input(s): "BNP" in the last 8760 hours.  ProBNP (last 3 results) No results for input(s): "PROBNP" in the last 8760 hours.  CBG: Recent Labs  Lab 03/12/23 2001  GLUCAP 210*    Radiological Exams on Admission: CT ABDOMEN PELVIS W CONTRAST  Result Date: 03/12/2023 CLINICAL DATA:  Pulmonary embolism suspected. High probability. Right-sided back pain for 2 months. Right upper quadrant pain. Also reports abscess left lateral thigh. EXAM: CT ANGIOGRAPHY CHEST CT ABDOMEN AND PELVIS WITH CONTRAST TECHNIQUE: Multidetector CT imaging of the chest was performed using the standard protocol during bolus administration of intravenous contrast. Multiplanar CT image reconstructions and MIPs were obtained to evaluate the vascular anatomy. Multidetector CT imaging of the abdomen and pelvis was performed using the standard protocol during bolus administration of intravenous contrast. RADIATION DOSE REDUCTION: This exam was performed according to the departmental dose-optimization program which includes automated  exposure control, adjustment of the mA and/or kV according to patient size and/or use of iterative reconstruction technique. CONTRAST:  OMNIPAQUE IOHEXOL 350 MG/ML SOLN COMPARISON:  Portable chest 02/03/2023, PA and lateral chest 02/01/2023, CT without contrast 12/27/2022, and CT with contrast 12/02/2017 FINDINGS: CTA CHEST FINDINGS Cardiovascular: There is mild-to-moderate cardiomegaly with a left chamber predominance. The pulmonary veins are normal caliber. The pulmonary trunk is slightly prominent 3.2 cm indicating arterial hypertension. There is IVC and hepatic vein contrast reflux which could be seen with right heart dysfunction or tricuspid regurgitation, but the right heart chambers are not enlarged. There is a small circumferential pericardial effusion. There are no visible coronary calcific plaques. The aorta and great vessels are normal. The pulmonary arteries are free of thrombotic filling defects through the segmental divisions. The subsegmental arterial bed is unopacified due to bolus timing and not evaluated. A subsegmental embolus could be missed. Mediastinum/Nodes: Moderate elevation right hemidiaphragm. No thyroid or axillary mass is seen. Thoracic esophagus with with a normal wall thickness. The trachea and main bronchi are clear. No intrathoracic adenopathy is seen. Lungs/Pleura: Lungs are clear apart from mild diffuse bronchial thickening. No pleural effusion or pneumothorax. Musculoskeletal: There is a 2 cm subcutaneous cystic lesion, Hounsfield density of -5, in the cleavage area between the breasts to the left, statistically most likely a sebaceous cyst. Infectious process not excluded. Please correlate clinically. There are no acute or significant osseous findings. The ribcage is intact. Review of the MIP images confirms the above findings. CT ABDOMEN and PELVIS FINDINGS Hepatobiliary: The liver moderately steatotic measuring 19 cm length. There is no mass enhancement. The gallbladder  is absent, without biliary dilatation. Pancreas: No focal abnormality, ductal dilatation or inflammatory change. Spleen: No mass.  No splenomegaly. Adrenals/Urinary Tract: No adrenal or renal cortical mass is seen. There is contrast in the renal collecting systems and ureters but there is no hydronephrosis or visible filling defect. The contrast could obscure small intrarenal stones. The bladder is only partially distended but unremarkable for the degree of distention. Stomach/Bowel: No dilatation or wall thickening including the appendix. Scattered uncomplicated left colonic diverticulosis. Mild-to-moderate constipation. Vascular/Lymphatic: No significant vascular findings are present. No enlarged abdominal or pelvic lymph nodes. There are multiple pelvic phleboliths. Reproductive: The uterus is intact. IUD again noted in expected position. There is a homogeneous 4.7 cm thin walled left ovarian cyst, Hounsfield density of 21, with no adjacent inflammatory reaction or edema. Follow-up pelvic ultrasound recommended if there are localizing  symptoms. No follow-up imaging is recommended if there are no localizing symptoms. The right ovary is unremarkable. Other: There is trace fluid in the pelvic cul-de-sac. There is no free hemorrhage, free air, acute inflammatory change or abscess. There is rectus diastasis and small fat hernia at the umbilicus. There is no incarcerated hernia. Musculoskeletal: Mild unilateral chronic right sacroiliitis without erosive changes. No acute or significant osseous findings. Review of the MIP images confirms the above findings. IMPRESSION: 1. No evidence of arterial embolus through the segmental divisions. The subsegmental arterial bed is unopacified due to bolus timing and not evaluated. 2. Cardiomegaly with small circumferential pericardial effusion. 3. Slight prominence of the pulmonary trunk indicating arterial hypertension. 4. IVC and hepatic vein contrast reflux which could be seen  with right heart dysfunction or tricuspid regurgitation, but the right heart chambers are not enlarged. 5. Bronchitis without evidence of pneumonia. 6. 2 cm subcutaneous cystic lesion in the cleavage area between the breasts to the left, statistically most likely a sebaceous cyst. Infectious process not excluded. 7. Constipation and diverticulosis. 8. 4.7 cm thin walled left ovarian cyst. Follow-up pelvic ultrasound recommended if there are localizing symptoms. 9. Trace fluid in the pelvic cul-de-sac, probably physiologic or could be due to cyst leakage. 10. Moderate hepatic steatosis. 11. 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 12. Rectus diastasis and small fat hernia at the umbilicus. Electronically Signed   By: Almira Bar M.D.   On: 03/12/2023 22:19   CT Angio Chest PE W and/or Wo Contrast  Result Date: 03/12/2023 CLINICAL DATA:  Pulmonary embolism suspected. High probability. Right-sided back pain for 2 months. Right upper quadrant pain. Also reports abscess left lateral thigh. EXAM: CT ANGIOGRAPHY CHEST CT ABDOMEN AND PELVIS WITH CONTRAST TECHNIQUE: Multidetector CT imaging of the chest was performed using the standard protocol during bolus administration of intravenous contrast. Multiplanar CT image reconstructions and MIPs were obtained to evaluate the vascular anatomy. Multidetector CT imaging of the abdomen and pelvis was performed using the standard protocol during bolus administration of intravenous contrast. RADIATION DOSE REDUCTION: This exam was performed according to the departmental dose-optimization program which includes automated exposure control, adjustment of the mA and/or kV according to patient size and/or use of iterative reconstruction technique. CONTRAST:  OMNIPAQUE IOHEXOL 350 MG/ML SOLN COMPARISON:  Portable chest 02/03/2023, PA and lateral chest 02/01/2023, CT without contrast 12/27/2022, and CT with contrast 12/02/2017 FINDINGS: CTA CHEST  FINDINGS Cardiovascular: There is mild-to-moderate cardiomegaly with a left chamber predominance. The pulmonary veins are normal caliber. The pulmonary trunk is slightly prominent 3.2 cm indicating arterial hypertension. There is IVC and hepatic vein contrast reflux which could be seen with right heart dysfunction or tricuspid regurgitation, but the right heart chambers are not enlarged. There is a small circumferential pericardial effusion. There are no visible coronary calcific plaques. The aorta and great vessels are normal. The pulmonary arteries are free of thrombotic filling defects through the segmental divisions. The subsegmental arterial bed is unopacified due to bolus timing and not evaluated. A subsegmental embolus could be missed. Mediastinum/Nodes: Moderate elevation right hemidiaphragm. No thyroid or axillary mass is seen. Thoracic esophagus with with a normal wall thickness. The trachea and main bronchi are clear. No intrathoracic adenopathy is seen. Lungs/Pleura: Lungs are clear apart from mild diffuse bronchial thickening. No pleural effusion or pneumothorax. Musculoskeletal: There is a 2 cm subcutaneous cystic lesion, Hounsfield density of -5, in the cleavage area between the breasts to the left, statistically most likely a sebaceous  cyst. Infectious process not excluded. Please correlate clinically. There are no acute or significant osseous findings. The ribcage is intact. Review of the MIP images confirms the above findings. CT ABDOMEN and PELVIS FINDINGS Hepatobiliary: The liver moderately steatotic measuring 19 cm length. There is no mass enhancement. The gallbladder is absent, without biliary dilatation. Pancreas: No focal abnormality, ductal dilatation or inflammatory change. Spleen: No mass.  No splenomegaly. Adrenals/Urinary Tract: No adrenal or renal cortical mass is seen. There is contrast in the renal collecting systems and ureters but there is no hydronephrosis or visible filling  defect. The contrast could obscure small intrarenal stones. The bladder is only partially distended but unremarkable for the degree of distention. Stomach/Bowel: No dilatation or wall thickening including the appendix. Scattered uncomplicated left colonic diverticulosis. Mild-to-moderate constipation. Vascular/Lymphatic: No significant vascular findings are present. No enlarged abdominal or pelvic lymph nodes. There are multiple pelvic phleboliths. Reproductive: The uterus is intact. IUD again noted in expected position. There is a homogeneous 4.7 cm thin walled left ovarian cyst, Hounsfield density of 21, with no adjacent inflammatory reaction or edema. Follow-up pelvic ultrasound recommended if there are localizing symptoms. No follow-up imaging is recommended if there are no localizing symptoms. The right ovary is unremarkable. Other: There is trace fluid in the pelvic cul-de-sac. There is no free hemorrhage, free air, acute inflammatory change or abscess. There is rectus diastasis and small fat hernia at the umbilicus. There is no incarcerated hernia. Musculoskeletal: Mild unilateral chronic right sacroiliitis without erosive changes. No acute or significant osseous findings. Review of the MIP images confirms the above findings. IMPRESSION: 1. No evidence of arterial embolus through the segmental divisions. The subsegmental arterial bed is unopacified due to bolus timing and not evaluated. 2. Cardiomegaly with small circumferential pericardial effusion. 3. Slight prominence of the pulmonary trunk indicating arterial hypertension. 4. IVC and hepatic vein contrast reflux which could be seen with right heart dysfunction or tricuspid regurgitation, but the right heart chambers are not enlarged. 5. Bronchitis without evidence of pneumonia. 6. 2 cm subcutaneous cystic lesion in the cleavage area between the breasts to the left, statistically most likely a sebaceous cyst. Infectious process not excluded. 7.  Constipation and diverticulosis. 8. 4.7 cm thin walled left ovarian cyst. Follow-up pelvic ultrasound recommended if there are localizing symptoms. 9. Trace fluid in the pelvic cul-de-sac, probably physiologic or could be due to cyst leakage. 10. Moderate hepatic steatosis. 11. 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 12. Rectus diastasis and small fat hernia at the umbilicus. Electronically Signed   By: Almira Bar M.D.   On: 03/12/2023 22:19   DG Femur Min 2 Views Left  Result Date: 03/12/2023 CLINICAL DATA:  Right-sided back pain for 2 months. Evaluate for gas forming organisms. Reports fluid-filled abscess on left lateral thigh. EXAM: LEFT FEMUR 2 VIEWS COMPARISON:  CT abdomen and pelvis 03/12/2023 at 9:41 p.m. FINDINGS: There is no evidence of fracture or other focal bone lesions. Soft tissues are unremarkable. No evidence of soft tissue gas. IMPRESSION: Negative. Electronically Signed   By: Minerva Fester M.D.   On: 03/12/2023 22:11    EKG: I independently viewed the EKG done and my findings are as followed: Sinus tachycardia rate of 106.  Nonspecific ST-T changes.  QTc 461.  Assessment/Plan Present on Admission:  Cellulitis  Principal Problem:   Cellulitis  Sepsis secondary to left thigh abscess and cellulitis, POA, status post incision and drainage by EDP, Dr. Karene Fry. Presented with leukocytosis, tachycardia, tachypnea, lactic  acidosis, 2.8, evidence of cellulitis on left thigh. Follow peripheral blood cultures for ID and sensitivities Continue broad-spectrum empiric IV antibiotics Check MRSA screening test Monitor fever curve and WBC Pain control as needed  Type 2 diabetes with hyperglycemia Presented with serum glucose of 318 Obtain hemoglobin A1c Start heart healthy carb modified diet Start insulin sliding scale.  Severe morbid obesity BMI 44 Recommend weight loss outpatient with regular physical activity and healthy dieting  Lactic acidosis  in the setting of sepsis Initial lactic acid on presentation 2.8, improved to 1.1 after IV fluid Continue to treat underlying conditions  Non anion gap metabolic acidosis Presented with serum bicarb of 21 and anion gap of 12 Continue IV fluid hydration Repeat BMP in the morning  Pseudohyponatremia Sodium corrected for hyperglycemia 135 Continue gentle IV fluid hydration.  OSA Resume CPAP nightly  Asthma No acute exacerbation Resume home bronchodilators   DVT prophylaxis: Subcu Lovenox daily  Code Status: Full code  Family Communication: None at bedside  Disposition Plan: Admitted to telemetry unit  Consults called: None.  Admission status: Observation status.   Status is: Observation    Darlin Drop MD [facility] Pager [phone]  If 7PM-7AM, please contact night-coverage [email or web address] Password [email or web address]  03/13/2023, 1:08 AM

## 2023-03-13 NOTE — Progress Notes (Signed)
[facility] Progress Note  Patient: Glenda Mann     ZOX:[id number]  DOA: 03/12/2023   PCP: Drema Halon, FNP       Brief hospital course: 37 y/o female with hidradenitis suppurative, DM2, OSA, asthma, DM2, bacterial vaginosis, chlamydia and HSV (shingles) who presents for left thigh abscess. She has also had a painful swelling in the right upper abdomen. She has swellings in her axilla R worse than left and under her breasts R > L as well.  WBC 12.8,NA 132, Bicarb 21, Glucose 318, LA 2.8.   Subjective:  Has pain in RUQ, under her breasts and area on left thigh is painful and draining.   Assessment and Plan: Principal Problem:   Hidradenitis suppurativa of multiple sites- sepsis H/O MRSA in 2022 - left abscess is draining clear liquid- she has numerous other areas as well - MRSA PCR + - cont Vanco, Ceftriaxone and pain medication - h/o recurrent candidal vaginitis related to antibiotics- cont Diflucan - Hold Humira - it was due on 4/15  Active Problems:  DM2 A1c 8.2 - cont SSI- hold Mounjaro for now- does not take any other meds  Back pain - b/l upper back and shoulder pain appears to be muscle spasms - cont Lidocaine patches  Bacterial vaginosis- recurrent episodes - cont Metrogel  HTN - follow - may be due to pain    Class 3 severe obesity with body mass index (BMI) of 40.0 to 44.9 in adult       Code Status: Full Code Consultants: none Level of Care: Level of care: Telemetry Total time on patient care: 40 min DVT prophylaxis:   Lovenox    Objective:   Vitals:   03/13/23 0124 03/13/23 0504 03/13/23 0941 03/13/23 1330  BP: (!) 163/93 (!) 142/93 (!) 152/94 (!) 159/97  Pulse: 98 95 92 93  Resp: 20 18 19 18   Temp: 98.8 F (37.1 C) 98.4 F (36.9 C) 98.4 F (36.9 C) 98.2 F (36.8 C)  TempSrc: Oral Oral Oral   SpO2: 94% 95% 98% 98%  Weight:      Height:       Filed Weights   03/12/23 1708  Weight: 120.2 kg   Exam: General exam:  Appears comfortable  HEENT: oral mucosa moist Respiratory system: Clear to auscultation.  Cardiovascular system: S1 & S2 heard  Gastrointestinal system: Abdomen soft, non-tender, nondistended. Normal bowel sounds   Extremities: No cyanosis, clubbing or edema Skin: left thigh abscess. She has also had a painful swelling in the right upper abdomen. She has swellings in her axilla R worse than left and under her breasts R > L as well Psychiatry:  Mood & affect appropriate.      CBC: Recent Labs  Lab 03/12/23 1716 03/13/23 0247  WBC 12.8* 13.5*  NEUTROABS 9.0*  --   HGB 11.3* 10.7*  HCT 35.5* 33.5*  MCV 85.7 87.2  PLT 304 289   Basic Metabolic Panel: Recent Labs  Lab 03/12/23 1716 03/13/23 0247  NA 132* 135  K 3.8 3.9  CL 99 103  CO2 21* 23  GLUCOSE 318* 177*  BUN 11 9  CREATININE 0.63 0.55  CALCIUM 8.3* 8.2*  MG  --  1.6*  PHOS  --  4.3   GFR: Estimated Creatinine Clearance: 127.5 mL/min (by C-G formula based on SCr of 0.55 mg/dL).  Scheduled Meds:  [START ON 03/14/2023] Chlorhexidine Gluconate Cloth  6 each Topical Q0600   enoxaparin (LOVENOX) injection  60 mg Subcutaneous Q24H  fluconazole  150 mg Oral Daily   fluticasone  1 spray Each Nare Daily   insulin aspart  0-5 Units Subcutaneous QHS   insulin aspart  0-9 Units Subcutaneous TID WC   lidocaine  2 patch Transdermal Q24H   metroNIDAZOLE  1 Applicatorful Vaginal QHS   mupirocin ointment  1 Application Nasal BID   rosuvastatin  20 mg Oral Daily   Continuous Infusions:  cefTRIAXone (ROCEPHIN)  IV     vancomycin 1,250 mg (03/13/23 1152)   Imaging and lab data was personally reviewed CT ABDOMEN PELVIS W CONTRAST  Result Date: 03/12/2023 CLINICAL DATA:  Pulmonary embolism suspected. High probability. Right-sided back pain for 2 months. Right upper quadrant pain. Also reports abscess left lateral thigh. EXAM: CT ANGIOGRAPHY CHEST CT ABDOMEN AND PELVIS WITH CONTRAST TECHNIQUE: Multidetector CT imaging of the  chest was performed using the standard protocol during bolus administration of intravenous contrast. Multiplanar CT image reconstructions and MIPs were obtained to evaluate the vascular anatomy. Multidetector CT imaging of the abdomen and pelvis was performed using the standard protocol during bolus administration of intravenous contrast. RADIATION DOSE REDUCTION: This exam was performed according to the departmental dose-optimization program which includes automated exposure control, adjustment of the mA and/or kV according to patient size and/or use of iterative reconstruction technique. CONTRAST:  OMNIPAQUE IOHEXOL 350 MG/ML SOLN COMPARISON:  Portable chest 02/03/2023, PA and lateral chest 02/01/2023, CT without contrast 12/27/2022, and CT with contrast 12/02/2017 FINDINGS: CTA CHEST FINDINGS Cardiovascular: There is mild-to-moderate cardiomegaly with a left chamber predominance. The pulmonary veins are normal caliber. The pulmonary trunk is slightly prominent 3.2 cm indicating arterial hypertension. There is IVC and hepatic vein contrast reflux which could be seen with right heart dysfunction or tricuspid regurgitation, but the right heart chambers are not enlarged. There is a small circumferential pericardial effusion. There are no visible coronary calcific plaques. The aorta and great vessels are normal. The pulmonary arteries are free of thrombotic filling defects through the segmental divisions. The subsegmental arterial bed is unopacified due to bolus timing and not evaluated. A subsegmental embolus could be missed. Mediastinum/Nodes: Moderate elevation right hemidiaphragm. No thyroid or axillary mass is seen. Thoracic esophagus with with a normal wall thickness. The trachea and main bronchi are clear. No intrathoracic adenopathy is seen. Lungs/Pleura: Lungs are clear apart from mild diffuse bronchial thickening. No pleural effusion or pneumothorax. Musculoskeletal: There is a 2 cm subcutaneous cystic  lesion, Hounsfield density of -5, in the cleavage area between the breasts to the left, statistically most likely a sebaceous cyst. Infectious process not excluded. Please correlate clinically. There are no acute or significant osseous findings. The ribcage is intact. Review of the MIP images confirms the above findings. CT ABDOMEN and PELVIS FINDINGS Hepatobiliary: The liver moderately steatotic measuring 19 cm length. There is no mass enhancement. The gallbladder is absent, without biliary dilatation. Pancreas: No focal abnormality, ductal dilatation or inflammatory change. Spleen: No mass.  No splenomegaly. Adrenals/Urinary Tract: No adrenal or renal cortical mass is seen. There is contrast in the renal collecting systems and ureters but there is no hydronephrosis or visible filling defect. The contrast could obscure small intrarenal stones. The bladder is only partially distended but unremarkable for the degree of distention. Stomach/Bowel: No dilatation or wall thickening including the appendix. Scattered uncomplicated left colonic diverticulosis. Mild-to-moderate constipation. Vascular/Lymphatic: No significant vascular findings are present. No enlarged abdominal or pelvic lymph nodes. There are multiple pelvic phleboliths. Reproductive: The uterus is intact. IUD again noted  in expected position. There is a homogeneous 4.7 cm thin walled left ovarian cyst, Hounsfield density of 21, with no adjacent inflammatory reaction or edema. Follow-up pelvic ultrasound recommended if there are localizing symptoms. No follow-up imaging is recommended if there are no localizing symptoms. The right ovary is unremarkable. Other: There is trace fluid in the pelvic cul-de-sac. There is no free hemorrhage, free air, acute inflammatory change or abscess. There is rectus diastasis and small fat hernia at the umbilicus. There is no incarcerated hernia. Musculoskeletal: Mild unilateral chronic right sacroiliitis without erosive  changes. No acute or significant osseous findings. Review of the MIP images confirms the above findings. IMPRESSION: 1. No evidence of arterial embolus through the segmental divisions. The subsegmental arterial bed is unopacified due to bolus timing and not evaluated. 2. Cardiomegaly with small circumferential pericardial effusion. 3. Slight prominence of the pulmonary trunk indicating arterial hypertension. 4. IVC and hepatic vein contrast reflux which could be seen with right heart dysfunction or tricuspid regurgitation, but the right heart chambers are not enlarged. 5. Bronchitis without evidence of pneumonia. 6. 2 cm subcutaneous cystic lesion in the cleavage area between the breasts to the left, statistically most likely a sebaceous cyst. Infectious process not excluded. 7. Constipation and diverticulosis. 8. 4.7 cm thin walled left ovarian cyst. Follow-up pelvic ultrasound recommended if there are localizing symptoms. 9. Trace fluid in the pelvic cul-de-sac, probably physiologic or could be due to cyst leakage. 10. Moderate hepatic steatosis. 11. 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 12. Rectus diastasis and small fat hernia at the umbilicus. Electronically Signed   By: Almira Bar M.D.   On: 03/12/2023 22:19   CT Angio Chest PE W and/or Wo Contrast  Result Date: 03/12/2023 CLINICAL DATA:  Pulmonary embolism suspected. High probability. Right-sided back pain for 2 months. Right upper quadrant pain. Also reports abscess left lateral thigh. EXAM: CT ANGIOGRAPHY CHEST CT ABDOMEN AND PELVIS WITH CONTRAST TECHNIQUE: Multidetector CT imaging of the chest was performed using the standard protocol during bolus administration of intravenous contrast. Multiplanar CT image reconstructions and MIPs were obtained to evaluate the vascular anatomy. Multidetector CT imaging of the abdomen and pelvis was performed using the standard protocol during bolus administration of intravenous  contrast. RADIATION DOSE REDUCTION: This exam was performed according to the departmental dose-optimization program which includes automated exposure control, adjustment of the mA and/or kV according to patient size and/or use of iterative reconstruction technique. CONTRAST:  OMNIPAQUE IOHEXOL 350 MG/ML SOLN COMPARISON:  Portable chest 02/03/2023, PA and lateral chest 02/01/2023, CT without contrast 12/27/2022, and CT with contrast 12/02/2017 FINDINGS: CTA CHEST FINDINGS Cardiovascular: There is mild-to-moderate cardiomegaly with a left chamber predominance. The pulmonary veins are normal caliber. The pulmonary trunk is slightly prominent 3.2 cm indicating arterial hypertension. There is IVC and hepatic vein contrast reflux which could be seen with right heart dysfunction or tricuspid regurgitation, but the right heart chambers are not enlarged. There is a small circumferential pericardial effusion. There are no visible coronary calcific plaques. The aorta and great vessels are normal. The pulmonary arteries are free of thrombotic filling defects through the segmental divisions. The subsegmental arterial bed is unopacified due to bolus timing and not evaluated. A subsegmental embolus could be missed. Mediastinum/Nodes: Moderate elevation right hemidiaphragm. No thyroid or axillary mass is seen. Thoracic esophagus with with a normal wall thickness. The trachea and main bronchi are clear. No intrathoracic adenopathy is seen. Lungs/Pleura: Lungs are clear apart from mild diffuse bronchial thickening.  No pleural effusion or pneumothorax. Musculoskeletal: There is a 2 cm subcutaneous cystic lesion, Hounsfield density of -5, in the cleavage area between the breasts to the left, statistically most likely a sebaceous cyst. Infectious process not excluded. Please correlate clinically. There are no acute or significant osseous findings. The ribcage is intact. Review of the MIP images confirms the above findings. CT  ABDOMEN and PELVIS FINDINGS Hepatobiliary: The liver moderately steatotic measuring 19 cm length. There is no mass enhancement. The gallbladder is absent, without biliary dilatation. Pancreas: No focal abnormality, ductal dilatation or inflammatory change. Spleen: No mass.  No splenomegaly. Adrenals/Urinary Tract: No adrenal or renal cortical mass is seen. There is contrast in the renal collecting systems and ureters but there is no hydronephrosis or visible filling defect. The contrast could obscure small intrarenal stones. The bladder is only partially distended but unremarkable for the degree of distention. Stomach/Bowel: No dilatation or wall thickening including the appendix. Scattered uncomplicated left colonic diverticulosis. Mild-to-moderate constipation. Vascular/Lymphatic: No significant vascular findings are present. No enlarged abdominal or pelvic lymph nodes. There are multiple pelvic phleboliths. Reproductive: The uterus is intact. IUD again noted in expected position. There is a homogeneous 4.7 cm thin walled left ovarian cyst, Hounsfield density of 21, with no adjacent inflammatory reaction or edema. Follow-up pelvic ultrasound recommended if there are localizing symptoms. No follow-up imaging is recommended if there are no localizing symptoms. The right ovary is unremarkable. Other: There is trace fluid in the pelvic cul-de-sac. There is no free hemorrhage, free air, acute inflammatory change or abscess. There is rectus diastasis and small fat hernia at the umbilicus. There is no incarcerated hernia. Musculoskeletal: Mild unilateral chronic right sacroiliitis without erosive changes. No acute or significant osseous findings. Review of the MIP images confirms the above findings. IMPRESSION: 1. No evidence of arterial embolus through the segmental divisions. The subsegmental arterial bed is unopacified due to bolus timing and not evaluated. 2. Cardiomegaly with small circumferential pericardial  effusion. 3. Slight prominence of the pulmonary trunk indicating arterial hypertension. 4. IVC and hepatic vein contrast reflux which could be seen with right heart dysfunction or tricuspid regurgitation, but the right heart chambers are not enlarged. 5. Bronchitis without evidence of pneumonia. 6. 2 cm subcutaneous cystic lesion in the cleavage area between the breasts to the left, statistically most likely a sebaceous cyst. Infectious process not excluded. 7. Constipation and diverticulosis. 8. 4.7 cm thin walled left ovarian cyst. Follow-up pelvic ultrasound recommended if there are localizing symptoms. 9. Trace fluid in the pelvic cul-de-sac, probably physiologic or could be due to cyst leakage. 10. Moderate hepatic steatosis. 11. 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 12. Rectus diastasis and small fat hernia at the umbilicus. Electronically Signed   By: Almira Bar M.D.   On: 03/12/2023 22:19   DG Femur Min 2 Views Left  Result Date: 03/12/2023 CLINICAL DATA:  Right-sided back pain for 2 months. Evaluate for gas forming organisms. Reports fluid-filled abscess on left lateral thigh. EXAM: LEFT FEMUR 2 VIEWS COMPARISON:  CT abdomen and pelvis 03/12/2023 at 9:41 p.m. FINDINGS: There is no evidence of fracture or other focal bone lesions. Soft tissues are unremarkable. No evidence of soft tissue gas. IMPRESSION: Negative. Electronically Signed   By: Minerva Fester M.D.   On: 03/12/2023 22:11    LOS: 0 days   Author: Ladell Heads Demi Trieu  03/13/2023 1:46 PM  To contact [facility]>   Check the care team in [facility] and look for the attending/consulting  [facility] provider listed  Log into [email or web address] and use [facility]'s universal password   Go to> "[facility]"  and find provider  If you still have difficulty reaching the provider, please page the [facility] (Director on Call) for the Hospitalists listed on amion

## 2023-03-13 NOTE — Plan of Care (Signed)

## 2023-03-13 NOTE — TOC Initial Note (Signed)
Transition of Care [facility]) - Initial/Assessment Note    Patient Details  Name: Glenda Mann MRN: [id number] Date of Birth: 1986/10/28  Transition of Care [facility]) CM/SW Contact:    Durenda Guthrie, RN Phone Number: 03/13/2023, 10:59 AM  Clinical Narrative:                 37 yr old female from home with c/o left thigh abscess.    Transition of Care [facility]) Department has reviewed patient and no TOC needs have been identified at this time. We will continue to monitor patient advancement through Interdisciplinary progressions and if new patient needs arise, please place a consult.      Patient Goals and CMS Choice            Expected Discharge Plan and Services                                              Prior Living Arrangements/Services                       Activities of Daily Living Home Assistive Devices/Equipment: None ADL Screening (condition at time of admission) Patient's cognitive ability adequate to safely complete daily activities?: Yes Is the patient deaf or have difficulty hearing?: No Does the patient have difficulty seeing, even when wearing glasses/contacts?: No Does the patient have difficulty concentrating, remembering, or making decisions?: No Patient able to express need for assistance with ADLs?: Yes Does the patient have difficulty dressing or bathing?: No Independently performs ADLs?: Yes (appropriate for developmental age) Does the patient have difficulty walking or climbing stairs?: Yes Weakness of Legs: None Weakness of Arms/Hands: None  Permission Sought/Granted                  Emotional Assessment              Admission diagnosis:  Abscess [L02.91] Cellulitis [L03.90] Cellulitis, unspecified cellulitis site [L03.90] Sepsis, due to unspecified organism, unspecified whether acute organ dysfunction present [A41.9] Patient Active Problem List   Diagnosis Date Noted   Cellulitis 03/12/2023   Class 3 severe  obesity with body mass index (BMI) of 40.0 to 44.9 in adult 09/28/2022   Blepharitis of upper and lower eyelids of both eyes 04/19/2018   Obesity (BMI 30-39.9) 11/19/2015   Type 2 diabetes mellitus with diabetic polyneuropathy, without long-term current use of insulin 11/10/2015   Diabetic peripheral neuropathy 11/10/2015   Hidradenitis axillaris 12/20/2011   PCP:  Drema Halon, FNP Pharmacy:   [id number] - [place], [place] - [address] [place] [address] Phone: [phone] Fax: [phone]  [id number] - [place], [place] - [address] AT [facility] [address] [place] [address] Phone: [phone] Fax: [phone]     Social Determinants of Health (SDOH) Social History: SDOH Screenings   Food Insecurity: No Food Insecurity (03/13/2023)  Housing: Low Risk  (03/13/2023)  Transportation Needs: No Transportation Needs (03/13/2023)  Utilities: Not At Risk (03/13/2023)  Tobacco Use: High Risk (03/12/2023)   SDOH Interventions:     Readmission Risk Interventions     No data to display

## 2023-03-13 NOTE — Progress Notes (Signed)
PHARMACY - PHYSICIAN COMMUNICATION CRITICAL VALUE ALERT - BLOOD CULTURE IDENTIFICATION (BCID)  Glenda Mann is an 37 y.o. female who presented to [name] on 03/12/2023 with a chief complaint of hidradenitis suppurative  Assessment:  1/3 BCx bottles growing staph species (possible contam vs true bacteremia from significant SSTI - note GPCs also in wound Cx)  Name of physician (or Provider) Contacted: Garner Nash  Current antibiotics: Vancomycin, Rocephin, Diflucan  Changes to prescribed antibiotics recommended: continue current abx until further speciation Patient is on recommended antibiotics - No changes needed  Results for orders placed or performed during the hospital encounter of 03/12/23  Blood Culture ID Panel (Reflexed) (Collected: 03/12/2023  6:52 PM)  Result Value Ref Range   Enterococcus faecalis NOT DETECTED NOT DETECTED   Enterococcus Faecium NOT DETECTED NOT DETECTED   Listeria monocytogenes NOT DETECTED NOT DETECTED   Staphylococcus species DETECTED (A) NOT DETECTED   Staphylococcus aureus (BCID) NOT DETECTED NOT DETECTED   Staphylococcus epidermidis NOT DETECTED NOT DETECTED   Staphylococcus lugdunensis NOT DETECTED NOT DETECTED   Streptococcus species NOT DETECTED NOT DETECTED   Streptococcus agalactiae NOT DETECTED NOT DETECTED   Streptococcus pneumoniae NOT DETECTED NOT DETECTED   Streptococcus pyogenes NOT DETECTED NOT DETECTED   A.calcoaceticus-baumannii NOT DETECTED NOT DETECTED   Bacteroides fragilis NOT DETECTED NOT DETECTED   Enterobacterales NOT DETECTED NOT DETECTED   Enterobacter cloacae complex NOT DETECTED NOT DETECTED   Escherichia coli NOT DETECTED NOT DETECTED   Klebsiella aerogenes NOT DETECTED NOT DETECTED   Klebsiella oxytoca NOT DETECTED NOT DETECTED   Klebsiella pneumoniae NOT DETECTED NOT DETECTED   Proteus species NOT DETECTED NOT DETECTED   Salmonella species NOT DETECTED NOT DETECTED   Serratia marcescens NOT DETECTED NOT DETECTED    Haemophilus influenzae NOT DETECTED NOT DETECTED   Neisseria meningitidis NOT DETECTED NOT DETECTED   Pseudomonas aeruginosa NOT DETECTED NOT DETECTED   Stenotrophomonas maltophilia NOT DETECTED NOT DETECTED   Candida albicans NOT DETECTED NOT DETECTED   Candida auris NOT DETECTED NOT DETECTED   Candida glabrata NOT DETECTED NOT DETECTED   Candida krusei NOT DETECTED NOT DETECTED   Candida parapsilosis NOT DETECTED NOT DETECTED   Candida tropicalis NOT DETECTED NOT DETECTED   Cryptococcus neoformans/gattii NOT DETECTED NOT DETECTED    Armanie Martine A 03/13/2023  9:38 PM

## 2023-03-14 DIAGNOSIS — L732 Hidradenitis suppurativa: Secondary | ICD-10-CM

## 2023-03-14 LAB — CBC
HCT: 35.8 % — ABNORMAL LOW (ref 36.0–46.0)
Hemoglobin: 11.1 g/dL — ABNORMAL LOW (ref 12.0–15.0)
MCH: 27.3 pg (ref 26.0–34.0)
MCHC: 31 g/dL (ref 30.0–36.0)
MCV: 88.2 fL (ref 80.0–100.0)
Platelets: 301 10*3/uL (ref 150–400)
RBC: 4.06 MIL/uL (ref 3.87–5.11)
RDW: 13.1 % (ref 11.5–15.5)
WBC: 11 10*3/uL — ABNORMAL HIGH (ref 4.0–10.5)
nRBC: 0 % (ref 0.0–0.2)

## 2023-03-14 LAB — GLUCOSE, CAPILLARY
Glucose-Capillary: 204 mg/dL — ABNORMAL HIGH (ref 70–99)
Glucose-Capillary: 229 mg/dL — ABNORMAL HIGH (ref 70–99)
Glucose-Capillary: 241 mg/dL — ABNORMAL HIGH (ref 70–99)
Glucose-Capillary: 273 mg/dL — ABNORMAL HIGH (ref 70–99)

## 2023-03-14 LAB — CULTURE, BLOOD (ROUTINE X 2): Special Requests: ADEQUATE

## 2023-03-14 LAB — AEROBIC CULTURE W GRAM STAIN (SUPERFICIAL SPECIMEN)

## 2023-03-14 MED ORDER — OXYCODONE HCL 5 MG PO TABS
5.0000 mg | ORAL_TABLET | Freq: Four times a day (QID) | ORAL | Status: AC | PRN
  Administered 2023-03-14 (×2): 5 mg via ORAL
  Filled 2023-03-14 (×2): qty 1

## 2023-03-14 MED ORDER — OXYCODONE HCL 5 MG PO TABS
7.5000 mg | ORAL_TABLET | Freq: Four times a day (QID) | ORAL | Status: DC | PRN
  Administered 2023-03-14 – 2023-03-15 (×4): 7.5 mg via ORAL
  Filled 2023-03-14 (×4): qty 2

## 2023-03-14 MED ORDER — INSULIN ASPART 100 UNIT/ML IJ SOLN
4.0000 [IU] | Freq: Three times a day (TID) | INTRAMUSCULAR | Status: DC
  Administered 2023-03-14 – 2023-03-15 (×3): 4 [IU] via SUBCUTANEOUS

## 2023-03-14 MED ORDER — HYDROMORPHONE HCL 1 MG/ML IJ SOLN
0.5000 mg | INTRAMUSCULAR | Status: DC | PRN
  Administered 2023-03-14 – 2023-03-15 (×4): 0.5 mg via INTRAVENOUS
  Filled 2023-03-14 (×4): qty 0.5

## 2023-03-14 NOTE — Progress Notes (Signed)
Mobility Specialist - Progress Note   03/14/23 1020  Mobility  Activity Ambulated with assistance in hallway  Level of Assistance Modified independent, requires aide device or extra time  Assistive Device None  Distance Ambulated (ft) 250 ft  Activity Response Tolerated well  Mobility Referral Yes  $Mobility charge 1 Mobility   Pt received in bed and agreed to mobility. Pt had no issues throughout session, pt returned to chair with all needs met, requesting another session in PM.  Marilynne Halsted Mobility Specialist

## 2023-03-14 NOTE — Inpatient Diabetes Management (Addendum)
Inpatient Diabetes Program Recommendations  AACE/ADA: New Consensus Statement on Inpatient Glycemic Control (2015)  Target Ranges:  Prepandial:   less than 140 mg/dL      Peak postprandial:   less than 180 mg/dL (1-2 hours)      Critically ill patients:  140 - 180 mg/dL   Lab Results  Component Value Date   GLUCAP 204 (H) 03/14/2023   HGBA1C 8.2 (H) 03/13/2023    Review of Glycemic Control  Latest Reference Range & Units 03/13/23 07:31 03/13/23 11:39 03/13/23 16:54 03/13/23 21:38 03/14/23 07:07  Glucose-Capillary 70 - 99 mg/dL 409 (H) 811 (H) 914 (H) 337 (H) 204 (H)   Diabetes history: DM 2 Outpatient Diabetes medications: Mounjaro 7.5 mg Weekly, metformin 2000 mg Daily, Glipizide 10 mg bid, Humalog 200 units Q 3 days in pump Omnipo insulin pump Dexcom G6 CGM Current orders for Inpatient glycemic control:   A1c 8.2% Inpatient Diabetes Program Recommendations:    Glucose trends increase after PO intake.  -   Add Novolog 4 units tid meal coverage if eating >50% of meals  Will see pt to verify medications again since home med rec has multiple medications  Spoke with pt at bedside. She sees Dr. March Rummage, Endocrinologist, for Diabetes management. Pt A1c has improved to 8.2% since starting the pump 4 months ago. Pt reports being on an Omnipod insulin pump with a Dexcom G6 CGM. She also takes Mounjaro, Metformin, and Glipizide. Pt is unable to recall her pump settings. Pt called Endocrinologist office while I was in the room to get settings off Omnipod insulin pump. I tried looking at Care Everywhere and could not find pump settings.  When pt gets a call back from the Endocrinologist office she will ask for the total basal rates in a 24 hour period, her sensitivity to insulin, her carbohydrate ratio, and her target glucose. Pt will save settings in her phone for future reference.  Thanks,  Christena Deem RN, MSN, BC-ADM Inpatient Diabetes Coordinator Team Pager [phone] (8a-5p)

## 2023-03-14 NOTE — Progress Notes (Signed)
PROGRESS NOTE  Glenda Mann  QMV:[id number] DOB: 06-04-1986 DOA: 03/12/2023 PCP: Drema Halon, FNP   Brief Narrative: Patient is a 37 year old female with history of disseminated hidradenitis suppurativa, diabetes 2, OSA, asthma, bacterial vaginosis, chlamydia, HSV who presented with left thigh abscess which ruptured spontaneously day before admission.  Complain of severe pain, swelling, redness around it..  Patient also had other painful hidradenitis suppurativa lesions around the body.No fever or chills.  On presentation, she was in sinus tachycardia.  Patient was admitted for suspicion of cellulitis/sepsis from recurrent abscess.  Aerobic/anaerobic cultures showing Staph aureus.  One of the blood culture sent showing staph species.Currently on broad-spectrum antibiotics.  Assessment & Plan:  Principal Problem:   Hidradenitis suppurativa of multiple sites Active Problems:   Class 3 severe obesity with body mass index (BMI) of 40.0 to 44.9 in adult   Cellulitis   Abscess  Sepsis secondary to left thigh abscess/cellulitis/gram-positive bacteremia: Had abscess on the left thigh with spontaneously ruptured.  I&D was also done in the emergency department.  History of hidradenitis suppurativa.  Was tachycardic on presentation, had leukocytosis, fever of 99.1, lactate of 2.8. I&D culture showing Staph aureus.  One of the blood culture sets showing staph species but not Staph aureus , likely contamination.  MRSA PCR positive. Currently on vancomycin and ceftriaxone.  Will follow-up susceptibility.  Continue vancomycin only for now. Leukocytosis improving.  Afebrile now.  Hidradenitis of multiple sites: Chronic problem.  Has multiple hidradenitis lesions which are tender around the body.  History of MRSA infections on 2022.   She has an appoint with dermatology as an outpatient  History of recurrent candidal vaginitis  related to antibiotics:On Diflucan  Diabetes type 2: A1c 8.2.  On  Mounjaro,glipizide,insulin ,metformin home.  Currently on sliding scale.  Diabetic coordinator following.  Back pain: Continue lidocaine patch.  Supportive care  History of bacterial vaginosis: Recurrent episodes.  On metronidazole  Hyperlipidemia: On rosuvastatin  Morbid obesity: BMI of 44.1        DVT prophylaxis:Lovenox     Code Status: Full Code  Family Communication: None at the bedside  Patient status:Inpatient  Patient is from :Home  Anticipated discharge MW:UXLK  Estimated DC date:2-3 days   Consultants: None  Procedures:I and D  Antimicrobials:  Anti-infectives (From admission, onward)    Start     Dose/Rate Route Frequency Ordered Stop   03/13/23 2000  cefTRIAXone (ROCEPHIN) 2 g in sodium chloride 0.9 % 100 mL IVPB        2 g 200 mL/hr over 30 Minutes Intravenous Every 24 hours 03/13/23 0224     03/13/23 1100  vancomycin (VANCOREADY) IVPB 1250 mg/250 mL        1,250 mg 166.7 mL/hr over 90 Minutes Intravenous Every 12 hours 03/12/23 2129 03/19/23 2259   03/13/23 1000  fluconazole (DIFLUCAN) tablet 150 mg        150 mg Oral Daily 03/13/23 0833     03/12/23 1930  vancomycin (VANCOCIN) IVPB 1000 mg/200 mL premix       See Hyperspace for full Linked Orders Report.   1,000 mg 200 mL/hr over 60 Minutes Intravenous  Once 03/12/23 1829 03/12/23 2204   03/12/23 1830  vancomycin (VANCOCIN) IVPB 1000 mg/200 mL premix  Status:  Discontinued        1,000 mg 200 mL/hr over 60 Minutes Intravenous  Once 03/12/23 1826 03/12/23 1829   03/12/23 1830  cefTRIAXone (ROCEPHIN) 2 g in sodium chloride 0.9 % 100 mL IVPB  2 g 200 mL/hr over 30 Minutes Intravenous  Once 03/12/23 1826 03/12/23 1945   03/12/23 1830  vancomycin (VANCOCIN) IVPB 1000 mg/200 mL premix       See Hyperspace for full Linked Orders Report.   1,000 mg 200 mL/hr over 60 Minutes Intravenous  Once 03/12/23 1829 03/12/23 2309       Subjective: Patient is in and examined the bedside today.   Hemodynamically stable.  Overall looks comfortable, sitting on the chair. Complain of painful sites all over the body secondary to hidradenitis.  Left thigh incision and drainage site examined, there was minimal drainage but  tender  Objective: Vitals:   03/13/23 1740 03/13/23 1914 03/13/23 2243 03/14/23 0503  BP: (!) 158/98 (!) 155/99  (!) 134/95  Pulse: 97 96 91 96  Resp:  17 18 18   Temp: 98.5 F (36.9 C) 98.5 F (36.9 C)  98.2 F (36.8 C)  TempSrc:  Oral  Oral  SpO2: 99% 100% 98% 95%  Weight:      Height:        Intake/Output Summary (Last 24 hours) at 03/14/2023 1054 Last data filed at 03/13/2023 1550 Gross per 24 hour  Intake 250 ml  Output --  Net 250 ml   Filed Weights   03/12/23 1708  Weight: 120.2 kg    Examination:  General exam: Overall comfortable, not in distress, morbidly obese HEENT: PERRL Respiratory system:  no wheezes or crackles  Cardiovascular system: S1 & S2 heard, RRR.  Gastrointestinal system: Abdomen is nondistended, soft and nontender. Central nervous system: Alert and oriented Extremities: No edema, no clubbing ,no cyanosis Skin: I&D site on the left upper thigh, numerous hidradenitis lesions including right upper quadrant, right armpit, beneath right breast   Data Reviewed: I have personally reviewed following labs and imaging studies  CBC: Recent Labs  Lab 03/12/23 1716 03/13/23 0247 03/14/23 0822  WBC 12.8* 13.5* 11.0*  NEUTROABS 9.0*  --   --   HGB 11.3* 10.7* 11.1*  HCT 35.5* 33.5* 35.8*  MCV 85.7 87.2 88.2  PLT 304 289 301   Basic Metabolic Panel: Recent Labs  Lab 03/12/23 1716 03/13/23 0247  NA 132* 135  K 3.8 3.9  CL 99 103  CO2 21* 23  GLUCOSE 318* 177*  BUN 11 9  CREATININE 0.63 0.55  CALCIUM 8.3* 8.2*  MG  --  1.6*  PHOS  --  4.3     Recent Results (from the past 240 hour(s))  Resp panel by RT-PCR (RSV, Flu A&B, Covid) Anterior Nasal Swab     Status: None   Collection Time: 03/12/23  6:26 PM   Specimen:  Anterior Nasal Swab  Result Value Ref Range Status   SARS Coronavirus 2 by RT PCR NEGATIVE NEGATIVE Final    Comment: (NOTE) SARS-CoV-2 target nucleic acids are NOT DETECTED.  The SARS-CoV-2 RNA is generally detectable in upper respiratory specimens during the acute phase of infection. The lowest concentration of SARS-CoV-2 viral copies this assay can detect is 138 copies/mL. A negative result does not preclude SARS-Cov-2 infection and should not be used as the sole basis for treatment or other patient management decisions. A negative result may occur with  improper specimen collection/handling, submission of specimen other than nasopharyngeal swab, presence of viral mutation(s) within the areas targeted by this assay, and inadequate number of viral copies(<138 copies/mL). A negative result must be combined with clinical observations, patient history, and epidemiological information. The expected result is Negative.  Fact Sheet for  Patients:  BloggerCourse.com  Fact Sheet for Healthcare Providers:  SeriousBroker.it  This test is no t yet approved or cleared by the [place] FDA and  has been authorized for detection and/or diagnosis of SARS-CoV-2 by FDA under an Emergency Use Authorization (EUA). This EUA will remain  in effect (meaning this test can be used) for the duration of the COVID-19 declaration under Section 564(b)(1) of the Act, 21 U.S.C.section 360bbb-3(b)(1), unless the authorization is terminated  or revoked sooner.       Influenza A by PCR NEGATIVE NEGATIVE Final   Influenza B by PCR NEGATIVE NEGATIVE Final    Comment: (NOTE) The Xpert Xpress SARS-CoV-2/FLU/RSV plus assay is intended as an aid in the diagnosis of influenza from Nasopharyngeal swab specimens and should not be used as a sole basis for treatment. Nasal washings and aspirates are unacceptable for Xpert Xpress SARS-CoV-2/FLU/RSV testing.  Fact  Sheet for Patients: BloggerCourse.com  Fact Sheet for Healthcare Providers: SeriousBroker.it  This test is not yet approved or cleared by the [place] FDA and has been authorized for detection and/or diagnosis of SARS-CoV-2 by FDA under an Emergency Use Authorization (EUA). This EUA will remain in effect (meaning this test can be used) for the duration of the COVID-19 declaration under Section 564(b)(1) of the Act, 21 U.S.C. section 360bbb-3(b)(1), unless the authorization is terminated or revoked.     Resp Syncytial Virus by PCR NEGATIVE NEGATIVE Final    Comment: (NOTE) Fact Sheet for Patients: BloggerCourse.com  Fact Sheet for Healthcare Providers: SeriousBroker.it  This test is not yet approved or cleared by the [place] FDA and has been authorized for detection and/or diagnosis of SARS-CoV-2 by FDA under an Emergency Use Authorization (EUA). This EUA will remain in effect (meaning this test can be used) for the duration of the COVID-19 declaration under Section 564(b)(1) of the Act, 21 U.S.C. section 360bbb-3(b)(1), unless the authorization is terminated or revoked.  Performed at [facility], [address] [place], [place] [address]   Blood culture (routine x 2)     Status: None (Preliminary result)   Collection Time: 03/12/23  6:47 PM   Specimen: BLOOD  Result Value Ref Range Status   Specimen Description   Final    BLOOD BOTTLES DRAWN AEROBIC ONLY Performed at [facility], [address] [place], [place] [address]    Special Requests   Final    BLOOD LEFT HAND Blood Culture adequate volume Performed at [facility], [address] [place], [place] [address]    Culture   Final    NO GROWTH 2 DAYS Performed at [facility], [address] [place], [place] [address]    Report Status PENDING  Incomplete  Blood  culture (routine x 2)     Status: None (Preliminary result)   Collection Time: 03/12/23  6:52 PM   Specimen: BLOOD  Result Value Ref Range Status   Specimen Description   Final    BLOOD BOTTLES DRAWN AEROBIC AND ANAEROBIC Performed at [facility], [address] [place], [place] [address]    Special Requests   Final    RIGHT ANTECUBITAL Blood Culture adequate volume Performed at [facility], [address] [place], [place] [address]    Culture  Setup Time   Final    GRAM POSITIVE COCCI IN CLUSTERS AEROBIC BOTTLE ONLY CRITICAL RESULT CALLED TO, READ BACK BY AND VERIFIED WITH:  PHARMD DREW WOFFORD ON 03/13/23 @ 2136 BY DRT Performed at [facility], [address] [place], [place] [address]    Culture GRAM POSITIVE COCCI IN CLUSTERS  Final   Report Status PENDING  Incomplete  Blood Culture ID Panel (Reflexed)     Status: Abnormal   Collection Time: 03/12/23  6:52 PM  Result Value Ref Range Status   Enterococcus faecalis NOT DETECTED NOT DETECTED Final   Enterococcus Faecium NOT DETECTED NOT DETECTED Final   Listeria monocytogenes NOT DETECTED NOT DETECTED Final   Staphylococcus species DETECTED (A) NOT DETECTED Final    Comment: CRITICAL RESULT CALLED TO, READ BACK BY AND VERIFIED WITH: PHARMD DREW WOFFORD ON 03/13/23 @ 2136 BY DRT    Staphylococcus aureus (BCID) NOT DETECTED NOT DETECTED Final   Staphylococcus epidermidis NOT DETECTED NOT DETECTED Final   Staphylococcus lugdunensis NOT DETECTED NOT DETECTED Final   Streptococcus species NOT DETECTED NOT DETECTED Final   Streptococcus agalactiae NOT DETECTED NOT DETECTED Final   Streptococcus pneumoniae NOT DETECTED NOT DETECTED Final   Streptococcus pyogenes NOT DETECTED NOT DETECTED Final   A.calcoaceticus-baumannii NOT DETECTED NOT DETECTED Final   Bacteroides fragilis NOT DETECTED NOT DETECTED Final   Enterobacterales NOT DETECTED NOT DETECTED Final   Enterobacter cloacae complex NOT DETECTED  NOT DETECTED Final   Escherichia coli NOT DETECTED NOT DETECTED Final   Klebsiella aerogenes NOT DETECTED NOT DETECTED Final   Klebsiella oxytoca NOT DETECTED NOT DETECTED Final   Klebsiella pneumoniae NOT DETECTED NOT DETECTED Final   Proteus species NOT DETECTED NOT DETECTED Final   Salmonella species NOT DETECTED NOT DETECTED Final   Serratia marcescens NOT DETECTED NOT DETECTED Final   Haemophilus influenzae NOT DETECTED NOT DETECTED Final   Neisseria meningitidis NOT DETECTED NOT DETECTED Final   Pseudomonas aeruginosa NOT DETECTED NOT DETECTED Final   Stenotrophomonas maltophilia NOT DETECTED NOT DETECTED Final   Candida albicans NOT DETECTED NOT DETECTED Final   Candida auris NOT DETECTED NOT DETECTED Final   Candida glabrata NOT DETECTED NOT DETECTED Final   Candida krusei NOT DETECTED NOT DETECTED Final   Candida parapsilosis NOT DETECTED NOT DETECTED Final   Candida tropicalis NOT DETECTED NOT DETECTED Final   Cryptococcus neoformans/gattii NOT DETECTED NOT DETECTED Final    Comment: Performed at [facility], [address], [place], [place] [address]  MRSA Next Gen by PCR, Nasal     Status: Abnormal   Collection Time: 03/13/23  2:25 AM   Specimen: Nasal Mucosa; Nasal Swab  Result Value Ref Range Status   MRSA by PCR Next Gen DETECTED (A) NOT DETECTED Final    Comment: (NOTE) The GeneXpert MRSA Assay (FDA approved for NASAL specimens only), is one component of a comprehensive MRSA colonization surveillance program. It is not intended to diagnose MRSA infection nor to guide or monitor treatment for MRSA infections. Test performance is not FDA approved in patients less than 48 years old. Performed at [facility], [address], [place], [place] [address]   Aerobic Culture w Gram Stain (superficial specimen)     Status: None (Preliminary result)   Collection Time: 03/13/23  1:38 PM   Specimen: Wound  Result Value Ref Range Status   Specimen  Description   Final    WOUND Performed at [facility], [address] [place], [place] [address]    Special Requests   Final    LEFT LEG Performed at [facility], [address] [place], [place] [address]    Gram Stain   Final    FEW WBC PRESENT, PREDOMINANTLY PMN FEW GRAM POSITIVE COCCI    Culture   Final    MODERATE STAPHYLOCOCCUS AUREUS SUSCEPTIBILITIES TO FOLLOW Performed at [facility], [address], [place], [place] [address]    Report Status PENDING  Incomplete     Radiology Studies: CT ABDOMEN PELVIS W CONTRAST  Result Date: 03/12/2023 CLINICAL DATA:  Pulmonary embolism suspected. High probability. Right-sided back pain for 2 months. Right upper quadrant pain. Also reports abscess left lateral thigh. EXAM: CT ANGIOGRAPHY CHEST CT ABDOMEN AND PELVIS WITH CONTRAST TECHNIQUE: Multidetector CT imaging of the chest was performed using the standard protocol during bolus administration of intravenous contrast. Multiplanar CT image reconstructions and MIPs were obtained to evaluate the vascular anatomy. Multidetector CT imaging of the abdomen and pelvis was performed using the standard protocol during bolus administration of intravenous contrast. RADIATION DOSE REDUCTION: This exam was performed according to the departmental dose-optimization program which includes automated exposure control, adjustment of the mA and/or kV according to patient size and/or use of iterative reconstruction technique. CONTRAST:  OMNIPAQUE IOHEXOL 350 MG/ML SOLN COMPARISON:  Portable chest 02/03/2023, PA and lateral chest 02/01/2023, CT without contrast 12/27/2022, and CT with contrast 12/02/2017 FINDINGS: CTA CHEST FINDINGS Cardiovascular: There is mild-to-moderate cardiomegaly with a left chamber predominance. The pulmonary veins are normal caliber. The pulmonary trunk is slightly prominent 3.2 cm indicating arterial hypertension. There is IVC and hepatic  vein contrast reflux which could be seen with right heart dysfunction or tricuspid regurgitation, but the right heart chambers are not enlarged. There is a small circumferential pericardial effusion. There are no visible coronary calcific plaques. The aorta and great vessels are normal. The pulmonary arteries are free of thrombotic filling defects through the segmental divisions. The subsegmental arterial bed is unopacified due to bolus timing and not evaluated. A subsegmental embolus could be missed. Mediastinum/Nodes: Moderate elevation right hemidiaphragm. No thyroid or axillary mass is seen. Thoracic esophagus with with a normal wall thickness. The trachea and main bronchi are clear. No intrathoracic adenopathy is seen. Lungs/Pleura: Lungs are clear apart from mild diffuse bronchial thickening. No pleural effusion or pneumothorax. Musculoskeletal: There is a 2 cm subcutaneous cystic lesion, Hounsfield density of -5, in the cleavage area between the breasts to the left, statistically most likely a sebaceous cyst. Infectious process not excluded. Please correlate clinically. There are no acute or significant osseous findings. The ribcage is intact. Review of the MIP images confirms the above findings. CT ABDOMEN and PELVIS FINDINGS Hepatobiliary: The liver moderately steatotic measuring 19 cm length. There is no mass enhancement. The gallbladder is absent, without biliary dilatation. Pancreas: No focal abnormality, ductal dilatation or inflammatory change. Spleen: No mass.  No splenomegaly. Adrenals/Urinary Tract: No adrenal or renal cortical mass is seen. There is contrast in the renal collecting systems and ureters but there is no hydronephrosis or visible filling defect. The contrast could obscure small intrarenal stones. The bladder is only partially distended but unremarkable for the degree of distention. Stomach/Bowel: No dilatation or wall thickening including the appendix. Scattered uncomplicated left  colonic diverticulosis. Mild-to-moderate constipation. Vascular/Lymphatic: No significant vascular findings are present. No enlarged abdominal or pelvic lymph nodes. There are multiple pelvic phleboliths. Reproductive: The uterus is intact. IUD again noted in expected position. There is a homogeneous 4.7 cm thin walled left ovarian cyst, Hounsfield density of 21, with no adjacent inflammatory reaction or edema. Follow-up  pelvic ultrasound recommended if there are localizing symptoms. No follow-up imaging is recommended if there are no localizing symptoms. The right ovary is unremarkable. Other: There is trace fluid in the pelvic cul-de-sac. There is no free hemorrhage, free air, acute inflammatory change or abscess. There is rectus diastasis and small fat hernia at the umbilicus. There is no incarcerated hernia. Musculoskeletal: Mild unilateral chronic right sacroiliitis without erosive changes. No acute or significant osseous findings. Review of the MIP images confirms the above findings. IMPRESSION: 1. No evidence of arterial embolus through the segmental divisions. The subsegmental arterial bed is unopacified due to bolus timing and not evaluated. 2. Cardiomegaly with small circumferential pericardial effusion. 3. Slight prominence of the pulmonary trunk indicating arterial hypertension. 4. IVC and hepatic vein contrast reflux which could be seen with right heart dysfunction or tricuspid regurgitation, but the right heart chambers are not enlarged. 5. Bronchitis without evidence of pneumonia. 6. 2 cm subcutaneous cystic lesion in the cleavage area between the breasts to the left, statistically most likely a sebaceous cyst. Infectious process not excluded. 7. Constipation and diverticulosis. 8. 4.7 cm thin walled left ovarian cyst. Follow-up pelvic ultrasound recommended if there are localizing symptoms. 9. Trace fluid in the pelvic cul-de-sac, probably physiologic or could be due to cyst leakage. 10. Moderate  hepatic steatosis. 11. 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 12. Rectus diastasis and small fat hernia at the umbilicus. Electronically Signed   By: Almira Bar M.D.   On: 03/12/2023 22:19   CT Angio Chest PE W and/or Wo Contrast  Result Date: 03/12/2023 CLINICAL DATA:  Pulmonary embolism suspected. High probability. Right-sided back pain for 2 months. Right upper quadrant pain. Also reports abscess left lateral thigh. EXAM: CT ANGIOGRAPHY CHEST CT ABDOMEN AND PELVIS WITH CONTRAST TECHNIQUE: Multidetector CT imaging of the chest was performed using the standard protocol during bolus administration of intravenous contrast. Multiplanar CT image reconstructions and MIPs were obtained to evaluate the vascular anatomy. Multidetector CT imaging of the abdomen and pelvis was performed using the standard protocol during bolus administration of intravenous contrast. RADIATION DOSE REDUCTION: This exam was performed according to the departmental dose-optimization program which includes automated exposure control, adjustment of the mA and/or kV according to patient size and/or use of iterative reconstruction technique. CONTRAST:  OMNIPAQUE IOHEXOL 350 MG/ML SOLN COMPARISON:  Portable chest 02/03/2023, PA and lateral chest 02/01/2023, CT without contrast 12/27/2022, and CT with contrast 12/02/2017 FINDINGS: CTA CHEST FINDINGS Cardiovascular: There is mild-to-moderate cardiomegaly with a left chamber predominance. The pulmonary veins are normal caliber. The pulmonary trunk is slightly prominent 3.2 cm indicating arterial hypertension. There is IVC and hepatic vein contrast reflux which could be seen with right heart dysfunction or tricuspid regurgitation, but the right heart chambers are not enlarged. There is a small circumferential pericardial effusion. There are no visible coronary calcific plaques. The aorta and great vessels are normal. The pulmonary arteries are free of thrombotic  filling defects through the segmental divisions. The subsegmental arterial bed is unopacified due to bolus timing and not evaluated. A subsegmental embolus could be missed. Mediastinum/Nodes: Moderate elevation right hemidiaphragm. No thyroid or axillary mass is seen. Thoracic esophagus with with a normal wall thickness. The trachea and main bronchi are clear. No intrathoracic adenopathy is seen. Lungs/Pleura: Lungs are clear apart from mild diffuse bronchial thickening. No pleural effusion or pneumothorax. Musculoskeletal: There is a 2 cm subcutaneous cystic lesion, Hounsfield density of -5, in the cleavage area between the breasts to  the left, statistically most likely a sebaceous cyst. Infectious process not excluded. Please correlate clinically. There are no acute or significant osseous findings. The ribcage is intact. Review of the MIP images confirms the above findings. CT ABDOMEN and PELVIS FINDINGS Hepatobiliary: The liver moderately steatotic measuring 19 cm length. There is no mass enhancement. The gallbladder is absent, without biliary dilatation. Pancreas: No focal abnormality, ductal dilatation or inflammatory change. Spleen: No mass.  No splenomegaly. Adrenals/Urinary Tract: No adrenal or renal cortical mass is seen. There is contrast in the renal collecting systems and ureters but there is no hydronephrosis or visible filling defect. The contrast could obscure small intrarenal stones. The bladder is only partially distended but unremarkable for the degree of distention. Stomach/Bowel: No dilatation or wall thickening including the appendix. Scattered uncomplicated left colonic diverticulosis. Mild-to-moderate constipation. Vascular/Lymphatic: No significant vascular findings are present. No enlarged abdominal or pelvic lymph nodes. There are multiple pelvic phleboliths. Reproductive: The uterus is intact. IUD again noted in expected position. There is a homogeneous 4.7 cm thin walled left ovarian  cyst, Hounsfield density of 21, with no adjacent inflammatory reaction or edema. Follow-up pelvic ultrasound recommended if there are localizing symptoms. No follow-up imaging is recommended if there are no localizing symptoms. The right ovary is unremarkable. Other: There is trace fluid in the pelvic cul-de-sac. There is no free hemorrhage, free air, acute inflammatory change or abscess. There is rectus diastasis and small fat hernia at the umbilicus. There is no incarcerated hernia. Musculoskeletal: Mild unilateral chronic right sacroiliitis without erosive changes. No acute or significant osseous findings. Review of the MIP images confirms the above findings. IMPRESSION: 1. No evidence of arterial embolus through the segmental divisions. The subsegmental arterial bed is unopacified due to bolus timing and not evaluated. 2. Cardiomegaly with small circumferential pericardial effusion. 3. Slight prominence of the pulmonary trunk indicating arterial hypertension. 4. IVC and hepatic vein contrast reflux which could be seen with right heart dysfunction or tricuspid regurgitation, but the right heart chambers are not enlarged. 5. Bronchitis without evidence of pneumonia. 6. 2 cm subcutaneous cystic lesion in the cleavage area between the breasts to the left, statistically most likely a sebaceous cyst. Infectious process not excluded. 7. Constipation and diverticulosis. 8. 4.7 cm thin walled left ovarian cyst. Follow-up pelvic ultrasound recommended if there are localizing symptoms. 9. Trace fluid in the pelvic cul-de-sac, probably physiologic or could be due to cyst leakage. 10. Moderate hepatic steatosis. 11. 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 12. Rectus diastasis and small fat hernia at the umbilicus. Electronically Signed   By: Almira Bar M.D.   On: 03/12/2023 22:19   DG Femur Min 2 Views Left  Result Date: 03/12/2023 CLINICAL DATA:  Right-sided back pain for 2 months.  Evaluate for gas forming organisms. Reports fluid-filled abscess on left lateral thigh. EXAM: LEFT FEMUR 2 VIEWS COMPARISON:  CT abdomen and pelvis 03/12/2023 at 9:41 p.m. FINDINGS: There is no evidence of fracture or other focal bone lesions. Soft tissues are unremarkable. No evidence of soft tissue gas. IMPRESSION: Negative. Electronically Signed   By: Minerva Fester M.D.   On: 03/12/2023 22:11    Scheduled Meds:  Chlorhexidine Gluconate Cloth  6 each Topical Q0600   enoxaparin (LOVENOX) injection  60 mg Subcutaneous Q24H   fluconazole  150 mg Oral Daily   fluticasone  1 spray Each Nare Daily   insulin aspart  0-5 Units Subcutaneous QHS   insulin aspart  0-9 Units Subcutaneous TID  WC   lidocaine  2 patch Transdermal Q24H   metroNIDAZOLE  1 Applicatorful Vaginal QHS   mupirocin ointment  1 Application Nasal BID   rosuvastatin  20 mg Oral Daily   Continuous Infusions:  cefTRIAXone (ROCEPHIN)  IV 2 g (03/13/23 1957)   vancomycin 1,250 mg (03/13/23 2214)     LOS: 1 day   Burnadette Pop, MD [facility] P4/17/2024, 10:54 AM

## 2023-03-14 NOTE — Progress Notes (Signed)
Mobility Specialist - Progress Note   03/14/23 1340  Mobility  Activity Ambulated with assistance in hallway  Level of Assistance Modified independent, requires aide device or extra time  Assistive Device None  Distance Ambulated (ft) 250 ft  Activity Response Tolerated well  Mobility Referral Yes  $Mobility charge 1 Mobility   Pt received in chair and agreed to mobility, had no issues throughout session. Pt returned to chair with all needs met.  Marilynne Halsted Mobility Specialist

## 2023-03-15 ENCOUNTER — Other Ambulatory Visit: Payer: Self-pay | Admitting: Internal Medicine

## 2023-03-15 DIAGNOSIS — L732 Hidradenitis suppurativa: Secondary | ICD-10-CM | POA: Diagnosis not present

## 2023-03-15 LAB — AEROBIC CULTURE W GRAM STAIN (SUPERFICIAL SPECIMEN)

## 2023-03-15 LAB — CULTURE, BLOOD (ROUTINE X 2)
Culture: NO GROWTH
Special Requests: ADEQUATE

## 2023-03-15 LAB — GLUCOSE, CAPILLARY
Glucose-Capillary: 260 mg/dL — ABNORMAL HIGH (ref 70–99)
Glucose-Capillary: 309 mg/dL — ABNORMAL HIGH (ref 70–99)

## 2023-03-15 LAB — CREATININE, SERUM
Creatinine, Ser: 0.62 mg/dL (ref 0.44–1.00)
GFR, Estimated: >60 mL/min (ref >60)

## 2023-03-15 MED ORDER — OXYCODONE-ACETAMINOPHEN 5-325 MG PO TABS: 1.0000 | ORAL_TABLET | Freq: Four times a day (QID) | ORAL | 0 refills | Status: DC | PRN

## 2023-03-15 MED ORDER — OXYCODONE HCL 7.5 MG PO TABS: 7.5000 mg | ORAL_TABLET | Freq: Four times a day (QID) | ORAL | 0 refills | Status: DC | PRN

## 2023-03-15 MED ORDER — DOXYCYCLINE HYCLATE 100 MG PO TABS: 100.0000 mg | ORAL_TABLET | Freq: Two times a day (BID) | ORAL | Status: DC

## 2023-03-15 MED ORDER — FLUCONAZOLE 150 MG PO TABS: 150.0000 mg | ORAL_TABLET | Freq: Every day | ORAL | 0 refills | Status: AC

## 2023-03-15 MED ORDER — AMLODIPINE BESYLATE 5 MG PO TABS: 5.0000 mg | ORAL_TABLET | Freq: Every day | ORAL | Status: DC

## 2023-03-15 MED ORDER — BLOOD PRESSURE KIT: 1.0000 | PACK | Freq: Two times a day (BID) | 0 refills | Status: AC

## 2023-03-15 MED ORDER — LIDOCAINE 5 % EX PTCH: 2.0000 | MEDICATED_PATCH | CUTANEOUS | 0 refills | Status: AC

## 2023-03-15 MED ORDER — DOXYCYCLINE HYCLATE 100 MG PO TABS: 100.0000 mg | ORAL_TABLET | Freq: Two times a day (BID) | ORAL | 0 refills | Status: AC

## 2023-03-15 MED ORDER — AMLODIPINE BESYLATE 5 MG PO TABS: 5.0000 mg | ORAL_TABLET | Freq: Every day | ORAL | 0 refills | Status: AC

## 2023-03-15 NOTE — Inpatient Diabetes Management (Addendum)
Inpatient Diabetes Program Recommendations  AACE/ADA: New Consensus Statement on Inpatient Glycemic Control (2015)  Target Ranges:  Prepandial:   less than 140 mg/dL      Peak postprandial:   less than 180 mg/dL (1-2 hours)      Critically ill patients:  140 - 180 mg/dL   Lab Results  Component Value Date   GLUCAP 260 (H) 03/15/2023   HGBA1C 8.2 (H) 03/13/2023    Review of Glycemic Control  Current orders for Inpatient glycemic control:  Novolog 0-9 units TID and 0-5 units QHS Novolog 4 units TID with meals  Inpatient Diabetes Program Recommendations:    Please consider:  Semglee 18 units QD (0.15 units x 120.2 kg)  Will continue to follow while inpatient.  Thank you, Dulce Sellar, MSN, CDCES Diabetes Coordinator Inpatient Diabetes Program [phone] (team pager from 8a-5p)

## 2023-03-15 NOTE — Progress Notes (Signed)
Discharge teaching complete. Meds, diet, activity, follow up appointments reviewed and all questions answered. Copy of instructions given to patient and prescriptions sent to pharmacy.  

## 2023-03-15 NOTE — Progress Notes (Unsigned)
{  Select_TRH_Note:[id number]} 

## 2023-03-15 NOTE — Discharge Summary (Signed)
Physician Discharge Summary  Glenda Mann YQI:[id number] DOB: May 13, 1986 DOA: 03/12/2023  PCP: Drema Halon, FNP  Admit date: 03/12/2023 Discharge date: 03/15/2023  Admitted From: Home Disposition:  Home  Discharge Condition:Stable CODE STATUS:FULL Diet recommendation: Carb Modified    Brief/Interim Summary: Patient is a 37 year old female with history of disseminated hidradenitis suppurativa, diabetes 2, OSA, asthma, bacterial vaginosis, chlamydia, HSV who presented with left thigh abscess which ruptured spontaneously day before admission.  Complain of severe pain, swelling, redness on left thigh. Patient also had other painful hidradenitis suppurativa lesions around the body.No fever or chills.  On presentation, she was in sinus tachycardia.  Patient was admitted for suspicion of cellulitis/sepsis from recurrent abscess.  Underwent I&D of the left thigh abscess in the emergency department.  Started on broad spectrum antibiotics.Aerobic/anaerobic cultures showed Staph aureus, sensitive to doxycycline.  Medically stable for discharge home today with oral antibiotics  Following problems were addressed during the hospitalization:  Sepsis secondary to left thigh abscess/cellulitis: Had abscess on the left thigh with spontaneously ruptured.  I&D was also done in the emergency department.  History of hidradenitis suppurativa.  Was tachycardic on presentation, had leukocytosis, fever of 99.1, lactate of 2.8. I&D culture showing MRSA , sensitive to doxycycline.  One of the blood culture sets showing staph species but not Staph aureus , likely contamination.  Leukocytosis improving.  Afebrile now. Antibiotics changed to doxycycline.   Hidradenitis of multiple sites: Chronic problem.  Has multiple hidradenitis lesions which are tender around the body.  History of MRSA infections on 2022.   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  On Mounjaro,glipizide,insulin pump,metformin home.  She follows with endocrinology  Back pain: Continue lidocaine patch.  Supportive care.  Follow-up with orthopedics.   History of bacterial vaginosis: Recurrent episodes.  On metronidazole   Morbid obesity: BMI of 44.1  Discharge Diagnoses:  Principal Problem:   Hidradenitis suppurativa of multiple sites Active Problems:   Class 3 severe obesity with body mass index (BMI) of 40.0 to 44.9 in adult   Cellulitis   Abscess    Discharge Instructions  Discharge Instructions     Diet Carb Modified   Complete by: As directed    Discharge instructions   Complete by: As directed    1)Please take prescribed medications as instructed 2)Monitor your blood pressure and blood sugar at home 3)Follow up with your PCP in a week 4)Follow up with your endocrinologist as an outpatient   Increase activity slowly   Complete by: As directed       Allergies as of 03/15/2023       Reactions   Bactrim [sulfamethoxazole-trimethoprim] Other (See Comments)   Jittery    Penicillins Other (See Comments)   Family history of penicillin allergies   Reglan [metoclopramide] Other (See Comments)   Restlessness after IV metoclopramide    Ritalin [methylphenidate] Anxiety   Jittery        Medication List     STOP taking these medications    benzonatate 100 MG capsule Commonly known as: TESSALON   naproxen 500 MG tablet Commonly known as: NAPROSYN   NORCO PO   valACYclovir 1000 MG tablet Commonly known as: VALTREX       TAKE these medications    acetaminophen 500 MG tablet Commonly known as: TYLENOL Take 1 tablet (500 mg total) by mouth every 6 (six) hours  as needed.   albuterol 108 (90 Base) MCG/ACT inhaler Commonly known as: VENTOLIN HFA Inhale 1-2 puffs into the lungs every 6 (six) hours as needed for wheezing or shortness of breath. What changed: how much to take    amLODipine 5 MG tablet Commonly known as: NORVASC Take 1 tablet (5 mg total) by mouth daily.   Blood Pressure Kit 1 each by Does not apply route 2 (two) times daily.   budesonide-formoterol 160-4.5 MCG/ACT inhaler Commonly known as: SYMBICORT Inhale 2 puffs into the lungs daily as needed (wheezing, shortness of breath).   doxycycline 100 MG tablet Commonly known as: VIBRA-TABS Take 1 tablet (100 mg total) by mouth every 12 (twelve) hours for 7 days.   ergocalciferol 1.25 MG (50000 UT) capsule Commonly known as: VITAMIN D2 Take 50,000 Units by mouth once a week.   fluconazole 150 MG tablet Commonly known as: Diflucan Take 1 tablet (150 mg total) by mouth daily for 4 days. Start taking on: March 16, 2023   glipiZIDE 10 MG tablet Commonly known as: GLUCOTROL Take 10 mg by mouth 2 (two) times daily.   Humira (2 Pen) 40 MG/0.4ML Pnkt Generic drug: Adalimumab Inject 40 mg into the skin every Monday.   ibuprofen 800 MG tablet Commonly known as: ADVIL Take 800 mg by mouth daily as needed for moderate pain.   insulin lispro 100 UNIT/ML injection Commonly known as: HUMALOG Inject 200 Units into the skin every 3 (three) days. Load 200 units into pump every 3 days.   levonorgestrel 20 MCG/24HR IUD Commonly known as: MIRENA 1 each by Intrauterine route once.   lidocaine 5 % Commonly known as: Lidoderm Place 2 patches onto the skin daily. Remove & Discard patch within 12 hours or as directed by MD What changed: how much to take   metFORMIN 500 MG 24 hr tablet Commonly known as: GLUCOPHAGE-XR Take 2,000 mg by mouth daily with breakfast.   methocarbamol 500 MG tablet Commonly known as: ROBAXIN Take 1 tablet (500 mg total) by mouth 2 (two) times daily.   metroNIDAZOLE 0.75 % vaginal gel Commonly known as: METROGEL Place 1 Applicatorful vaginally at bedtime. 5 day course.   Mounjaro 7.5 MG/0.5ML Pen Generic drug: tirzepatide Inject 7.5 mg into the skin every Monday.    oxyCODONE HCl 7.5 MG Tabs Take 7.5 mg by mouth every 6 (six) hours as needed for moderate pain.        Follow-up Information     [facility] [place], [place]. Schedule an appointment as soon as possible for a visit in 1 week(s).   Specialty: Internal Medicine Contact information: [address] [place] [address] [phone]                Allergies  Allergen Reactions   Bactrim [Sulfamethoxazole-Trimethoprim] Other (See Comments)    Jittery    Penicillins Other (See Comments)    Family history of penicillin allergies   Reglan [Metoclopramide] Other (See Comments)    Restlessness after IV metoclopramide    Ritalin [Methylphenidate] Anxiety    Jittery    Consultations: None   Procedures/Studies: CT ABDOMEN PELVIS W CONTRAST  Result Date: 03/12/2023 CLINICAL DATA:  Pulmonary embolism suspected. High probability. Right-sided back pain for 2 months. Right upper quadrant pain. Also reports abscess left lateral thigh. EXAM: CT ANGIOGRAPHY CHEST CT ABDOMEN AND PELVIS WITH CONTRAST TECHNIQUE: Multidetector CT imaging of the chest was performed using the standard protocol during bolus administration of intravenous contrast. Multiplanar CT image  reconstructions and MIPs were obtained to evaluate the vascular anatomy. Multidetector CT imaging of the abdomen and pelvis was performed using the standard protocol during bolus administration of intravenous contrast. RADIATION DOSE REDUCTION: This exam was performed according to the departmental dose-optimization program which includes automated exposure control, adjustment of the mA and/or kV according to patient size and/or use of iterative reconstruction technique. CONTRAST:  OMNIPAQUE IOHEXOL 350 MG/ML SOLN COMPARISON:  Portable chest 02/03/2023, PA and lateral chest 02/01/2023, CT without contrast 12/27/2022, and CT with contrast 12/02/2017 FINDINGS: CTA CHEST FINDINGS Cardiovascular: There is mild-to-moderate  cardiomegaly with a left chamber predominance. The pulmonary veins are normal caliber. The pulmonary trunk is slightly prominent 3.2 cm indicating arterial hypertension. There is IVC and hepatic vein contrast reflux which could be seen with right heart dysfunction or tricuspid regurgitation, but the right heart chambers are not enlarged. There is a small circumferential pericardial effusion. There are no visible coronary calcific plaques. The aorta and great vessels are normal. The pulmonary arteries are free of thrombotic filling defects through the segmental divisions. The subsegmental arterial bed is unopacified due to bolus timing and not evaluated. A subsegmental embolus could be missed. Mediastinum/Nodes: Moderate elevation right hemidiaphragm. No thyroid or axillary mass is seen. Thoracic esophagus with with a normal wall thickness. The trachea and main bronchi are clear. No intrathoracic adenopathy is seen. Lungs/Pleura: Lungs are clear apart from mild diffuse bronchial thickening. No pleural effusion or pneumothorax. Musculoskeletal: There is a 2 cm subcutaneous cystic lesion, Hounsfield density of -5, in the cleavage area between the breasts to the left, statistically most likely a sebaceous cyst. Infectious process not excluded. Please correlate clinically. There are no acute or significant osseous findings. The ribcage is intact. Review of the MIP images confirms the above findings. CT ABDOMEN and PELVIS FINDINGS Hepatobiliary: The liver moderately steatotic measuring 19 cm length. There is no mass enhancement. The gallbladder is absent, without biliary dilatation. Pancreas: No focal abnormality, ductal dilatation or inflammatory change. Spleen: No mass.  No splenomegaly. Adrenals/Urinary Tract: No adrenal or renal cortical mass is seen. There is contrast in the renal collecting systems and ureters but there is no hydronephrosis or visible filling defect. The contrast could obscure small intrarenal  stones. The bladder is only partially distended but unremarkable for the degree of distention. Stomach/Bowel: No dilatation or wall thickening including the appendix. Scattered uncomplicated left colonic diverticulosis. Mild-to-moderate constipation. Vascular/Lymphatic: No significant vascular findings are present. No enlarged abdominal or pelvic lymph nodes. There are multiple pelvic phleboliths. Reproductive: The uterus is intact. IUD again noted in expected position. There is a homogeneous 4.7 cm thin walled left ovarian cyst, Hounsfield density of 21, with no adjacent inflammatory reaction or edema. Follow-up pelvic ultrasound recommended if there are localizing symptoms. No follow-up imaging is recommended if there are no localizing symptoms. The right ovary is unremarkable. Other: There is trace fluid in the pelvic cul-de-sac. There is no free hemorrhage, free air, acute inflammatory change or abscess. There is rectus diastasis and small fat hernia at the umbilicus. There is no incarcerated hernia. Musculoskeletal: Mild unilateral chronic right sacroiliitis without erosive changes. No acute or significant osseous findings. Review of the MIP images confirms the above findings. IMPRESSION: 1. No evidence of arterial embolus through the segmental divisions. The subsegmental arterial bed is unopacified due to bolus timing and not evaluated. 2. Cardiomegaly with small circumferential pericardial effusion. 3. Slight prominence of the pulmonary trunk indicating arterial hypertension. 4. IVC and hepatic vein contrast reflux  which could be seen with right heart dysfunction or tricuspid regurgitation, but the right heart chambers are not enlarged. 5. Bronchitis without evidence of pneumonia. 6. 2 cm subcutaneous cystic lesion in the cleavage area between the breasts to the left, statistically most likely a sebaceous cyst. Infectious process not excluded. 7. Constipation and diverticulosis. 8. 4.7 cm thin walled left  ovarian cyst. Follow-up pelvic ultrasound recommended if there are localizing symptoms. 9. Trace fluid in the pelvic cul-de-sac, probably physiologic or could be due to cyst leakage. 10. Moderate hepatic steatosis. 11. 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 12. Rectus diastasis and small fat hernia at the umbilicus. Electronically Signed   By: Almira Bar M.D.   On: 03/12/2023 22:19   CT Angio Chest PE W and/or Wo Contrast  Result Date: 03/12/2023 CLINICAL DATA:  Pulmonary embolism suspected. High probability. Right-sided back pain for 2 months. Right upper quadrant pain. Also reports abscess left lateral thigh. EXAM: CT ANGIOGRAPHY CHEST CT ABDOMEN AND PELVIS WITH CONTRAST TECHNIQUE: Multidetector CT imaging of the chest was performed using the standard protocol during bolus administration of intravenous contrast. Multiplanar CT image reconstructions and MIPs were obtained to evaluate the vascular anatomy. Multidetector CT imaging of the abdomen and pelvis was performed using the standard protocol during bolus administration of intravenous contrast. RADIATION DOSE REDUCTION: This exam was performed according to the departmental dose-optimization program which includes automated exposure control, adjustment of the mA and/or kV according to patient size and/or use of iterative reconstruction technique. CONTRAST:  OMNIPAQUE IOHEXOL 350 MG/ML SOLN COMPARISON:  Portable chest 02/03/2023, PA and lateral chest 02/01/2023, CT without contrast 12/27/2022, and CT with contrast 12/02/2017 FINDINGS: CTA CHEST FINDINGS Cardiovascular: There is mild-to-moderate cardiomegaly with a left chamber predominance. The pulmonary veins are normal caliber. The pulmonary trunk is slightly prominent 3.2 cm indicating arterial hypertension. There is IVC and hepatic vein contrast reflux which could be seen with right heart dysfunction or tricuspid regurgitation, but the right heart chambers are not  enlarged. There is a small circumferential pericardial effusion. There are no visible coronary calcific plaques. The aorta and great vessels are normal. The pulmonary arteries are free of thrombotic filling defects through the segmental divisions. The subsegmental arterial bed is unopacified due to bolus timing and not evaluated. A subsegmental embolus could be missed. Mediastinum/Nodes: Moderate elevation right hemidiaphragm. No thyroid or axillary mass is seen. Thoracic esophagus with with a normal wall thickness. The trachea and main bronchi are clear. No intrathoracic adenopathy is seen. Lungs/Pleura: Lungs are clear apart from mild diffuse bronchial thickening. No pleural effusion or pneumothorax. Musculoskeletal: There is a 2 cm subcutaneous cystic lesion, Hounsfield density of -5, in the cleavage area between the breasts to the left, statistically most likely a sebaceous cyst. Infectious process not excluded. Please correlate clinically. There are no acute or significant osseous findings. The ribcage is intact. Review of the MIP images confirms the above findings. CT ABDOMEN and PELVIS FINDINGS Hepatobiliary: The liver moderately steatotic measuring 19 cm length. There is no mass enhancement. The gallbladder is absent, without biliary dilatation. Pancreas: No focal abnormality, ductal dilatation or inflammatory change. Spleen: No mass.  No splenomegaly. Adrenals/Urinary Tract: No adrenal or renal cortical mass is seen. There is contrast in the renal collecting systems and ureters but there is no hydronephrosis or visible filling defect. The contrast could obscure small intrarenal stones. The bladder is only partially distended but unremarkable for the degree of distention. Stomach/Bowel: No dilatation or wall thickening including  the appendix. Scattered uncomplicated left colonic diverticulosis. Mild-to-moderate constipation. Vascular/Lymphatic: No significant vascular findings are present. No enlarged  abdominal or pelvic lymph nodes. There are multiple pelvic phleboliths. Reproductive: The uterus is intact. IUD again noted in expected position. There is a homogeneous 4.7 cm thin walled left ovarian cyst, Hounsfield density of 21, with no adjacent inflammatory reaction or edema. Follow-up pelvic ultrasound recommended if there are localizing symptoms. No follow-up imaging is recommended if there are no localizing symptoms. The right ovary is unremarkable. Other: There is trace fluid in the pelvic cul-de-sac. There is no free hemorrhage, free air, acute inflammatory change or abscess. There is rectus diastasis and small fat hernia at the umbilicus. There is no incarcerated hernia. Musculoskeletal: Mild unilateral chronic right sacroiliitis without erosive changes. No acute or significant osseous findings. Review of the MIP images confirms the above findings. IMPRESSION: 1. No evidence of arterial embolus through the segmental divisions. The subsegmental arterial bed is unopacified due to bolus timing and not evaluated. 2. Cardiomegaly with small circumferential pericardial effusion. 3. Slight prominence of the pulmonary trunk indicating arterial hypertension. 4. IVC and hepatic vein contrast reflux which could be seen with right heart dysfunction or tricuspid regurgitation, but the right heart chambers are not enlarged. 5. Bronchitis without evidence of pneumonia. 6. 2 cm subcutaneous cystic lesion in the cleavage area between the breasts to the left, statistically most likely a sebaceous cyst. Infectious process not excluded. 7. Constipation and diverticulosis. 8. 4.7 cm thin walled left ovarian cyst. Follow-up pelvic ultrasound recommended if there are localizing symptoms. 9. Trace fluid in the pelvic cul-de-sac, probably physiologic or could be due to cyst leakage. 10. Moderate hepatic steatosis. 11. 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 12. Rectus diastasis and small fat hernia at  the umbilicus. Electronically Signed   By: Almira Bar M.D.   On: 03/12/2023 22:19   DG Femur Min 2 Views Left  Result Date: 03/12/2023 CLINICAL DATA:  Right-sided back pain for 2 months. Evaluate for gas forming organisms. Reports fluid-filled abscess on left lateral thigh. EXAM: LEFT FEMUR 2 VIEWS COMPARISON:  CT abdomen and pelvis 03/12/2023 at 9:41 p.m. FINDINGS: There is no evidence of fracture or other focal bone lesions. Soft tissues are unremarkable. No evidence of soft tissue gas. IMPRESSION: Negative. Electronically Signed   By: Minerva Fester M.D.   On: 03/12/2023 22:11      Subjective: Patient seen and examined at bedside today.  She ambulated on the hallway very well with the tech.  Her pain on the left thigh is significantly better.  Wound examined at the bedside.  Appears clean without any redness.  Medically stable for discharge today  Discharge Exam: Vitals:   03/14/23 2231 03/15/23 0500  BP:  (!) 156/75  Pulse: 88 84  Resp: 20   Temp:  98.7 F (37.1 C)  SpO2: 98% 98%   Vitals:   03/14/23 1207 03/14/23 1928 03/14/23 2231 03/15/23 0500  BP: (!) 163/106 (!) 166/107  (!) 156/75  Pulse: 95 97 88 84  Resp: Temp: 98.4 F (36.9 C) 98.7 F (37.1 C)  98.7 F (37.1 C)  TempSrc: Oral Oral  Oral  SpO2: 99% 94% 98% 98%  Weight:      Height:        General: Pt is alert, awake, not in acute distress,obese Cardiovascular: RRR, S1/S2 +, no rubs, no gallops Respiratory: CTA bilaterally, no wheezing, no rhonchi Abdominal: Soft, NT, ND, bowel sounds +  Extremities: no edema, no cyanosis Skin: Multiple hidradenitis suppurativa wounds    The results of significant diagnostics from this hospitalization (including imaging, microbiology, ancillary and laboratory) are listed below for reference.     Microbiology: Recent Results (from the past 240 hour(s))  Resp panel by RT-PCR (RSV, Flu A&B, Covid) Anterior Nasal Swab     Status: None   Collection Time:  03/12/23  6:26 PM   Specimen: Anterior Nasal Swab  Result Value Ref Range Status   SARS Coronavirus 2 by RT PCR NEGATIVE NEGATIVE Final    Comment: (NOTE) SARS-CoV-2 target nucleic acids are NOT DETECTED.  The SARS-CoV-2 RNA is generally detectable in upper respiratory specimens during the acute phase of infection. The lowest concentration of SARS-CoV-2 viral copies this assay can detect is 138 copies/mL. A negative result does not preclude SARS-Cov-2 infection and should not be used as the sole basis for treatment or other patient management decisions. A negative result may occur with  improper specimen collection/handling, submission of specimen other than nasopharyngeal swab, presence of viral mutation(s) within the areas targeted by this assay, and inadequate number of viral copies(<138 copies/mL). A negative result must be combined with clinical observations, patient history, and epidemiological information. The expected result is Negative.  Fact Sheet for Patients:  BloggerCourse.com  Fact Sheet for Healthcare Providers:  SeriousBroker.it  This test is no t yet approved or cleared by the [place] FDA and  has been authorized for detection and/or diagnosis of SARS-CoV-2 by FDA under an Emergency Use Authorization (EUA). This EUA will remain  in effect (meaning this test can be used) for the duration of the COVID-19 declaration under Section 564(b)(1) of the Act, 21 U.S.C.section 360bbb-3(b)(1), unless the authorization is terminated  or revoked sooner.       Influenza A by PCR NEGATIVE NEGATIVE Final   Influenza B by PCR NEGATIVE NEGATIVE Final    Comment: (NOTE) The Xpert Xpress SARS-CoV-2/FLU/RSV plus assay is intended as an aid in the diagnosis of influenza from Nasopharyngeal swab specimens and should not be used as a sole basis for treatment. Nasal washings and aspirates are unacceptable for Xpert Xpress  SARS-CoV-2/FLU/RSV testing.  Fact Sheet for Patients: BloggerCourse.com  Fact Sheet for Healthcare Providers: SeriousBroker.it  This test is not yet approved or cleared by the [place] FDA and has been authorized for detection and/or diagnosis of SARS-CoV-2 by FDA under an Emergency Use Authorization (EUA). This EUA will remain in effect (meaning this test can be used) for the duration of the COVID-19 declaration under Section 564(b)(1) of the Act, 21 U.S.C. section 360bbb-3(b)(1), unless the authorization is terminated or revoked.     Resp Syncytial Virus by PCR NEGATIVE NEGATIVE Final    Comment: (NOTE) Fact Sheet for Patients: BloggerCourse.com  Fact Sheet for Healthcare Providers: SeriousBroker.it  This test is not yet approved or cleared by the [place] FDA and has been authorized for detection and/or diagnosis of SARS-CoV-2 by FDA under an Emergency Use Authorization (EUA). This EUA will remain in effect (meaning this test can be used) for the duration of the COVID-19 declaration under Section 564(b)(1) of the Act, 21 U.S.C. section 360bbb-3(b)(1), unless the authorization is terminated or revoked.  Performed at [facility], [address] [place], [place] [address]   Blood culture (routine x 2)     Status: None (Preliminary result)   Collection Time: 03/12/23  6:47 PM   Specimen: BLOOD  Result Value Ref Range Status  Specimen Description   Final    BLOOD BOTTLES DRAWN AEROBIC ONLY Performed at [facility], [address] [place], [place] [address]    Special Requests   Final    BLOOD LEFT HAND Blood Culture adequate volume Performed at [facility], [address] [place], [place] [address]    Culture   Final    NO GROWTH 3 DAYS Performed at [facility], [address], [place], [place] [address]    Report  Status PENDING  Incomplete  Blood culture (routine x 2)     Status: Abnormal   Collection Time: 03/12/23  6:52 PM   Specimen: BLOOD  Result Value Ref Range Status   Specimen Description   Final    BLOOD BOTTLES DRAWN AEROBIC AND ANAEROBIC Performed at [facility], [address] [place], [place] [address]    Special Requests   Final    RIGHT ANTECUBITAL Blood Culture adequate volume Performed at [facility], [address] [place], [place] [address]    Culture  Setup Time   Final    GRAM POSITIVE COCCI IN CLUSTERS AEROBIC BOTTLE ONLY CRITICAL RESULT CALLED TO, READ BACK BY AND VERIFIED WITH: PHARMD DREW WOFFORD ON 03/13/23 @ 2136 BY DRT    Culture (A)  Final    STAPHYLOCOCCUS HOMINIS THE SIGNIFICANCE OF ISOLATING THIS ORGANISM FROM A SINGLE SET OF BLOOD CULTURES WHEN MULTIPLE SETS ARE DRAWN IS UNCERTAIN. PLEASE NOTIFY THE MICROBIOLOGY DEPARTMENT WITHIN ONE WEEK IF SPECIATION AND SENSITIVITIES ARE REQUIRED. Performed at [facility], [address], [place], [place] [address]    Report Status 03/15/2023 FINAL  Final  Blood Culture ID Panel (Reflexed)     Status: Abnormal   Collection Time: 03/12/23  6:52 PM  Result Value Ref Range Status   Enterococcus faecalis NOT DETECTED NOT DETECTED Final   Enterococcus Faecium NOT DETECTED NOT DETECTED Final   Listeria monocytogenes NOT DETECTED NOT DETECTED Final   Staphylococcus species DETECTED (A) NOT DETECTED Final    Comment: CRITICAL RESULT CALLED TO, READ BACK BY AND VERIFIED WITH: PHARMD DREW WOFFORD ON 03/13/23 @ 2136 BY DRT    Staphylococcus aureus (BCID) NOT DETECTED NOT DETECTED Final   Staphylococcus epidermidis NOT DETECTED NOT DETECTED Final   Staphylococcus lugdunensis NOT DETECTED NOT DETECTED Final   Streptococcus species NOT DETECTED NOT DETECTED Final   Streptococcus agalactiae NOT DETECTED NOT DETECTED Final   Streptococcus pneumoniae NOT DETECTED NOT DETECTED Final   Streptococcus  pyogenes NOT DETECTED NOT DETECTED Final   A.calcoaceticus-baumannii NOT DETECTED NOT DETECTED Final   Bacteroides fragilis NOT DETECTED NOT DETECTED Final   Enterobacterales NOT DETECTED NOT DETECTED Final   Enterobacter cloacae complex NOT DETECTED NOT DETECTED Final   Escherichia coli NOT DETECTED NOT DETECTED Final   Klebsiella aerogenes NOT DETECTED NOT DETECTED Final   Klebsiella oxytoca NOT DETECTED NOT DETECTED Final   Klebsiella pneumoniae NOT DETECTED NOT DETECTED Final   Proteus species NOT DETECTED NOT DETECTED Final   Salmonella species NOT DETECTED NOT DETECTED Final   Serratia marcescens NOT DETECTED NOT DETECTED Final   Haemophilus influenzae NOT DETECTED NOT DETECTED Final   Neisseria meningitidis NOT DETECTED NOT DETECTED Final   Pseudomonas aeruginosa NOT DETECTED NOT DETECTED Final   Stenotrophomonas maltophilia NOT DETECTED NOT DETECTED Final   Candida albicans NOT DETECTED NOT DETECTED Final   Candida auris NOT DETECTED NOT DETECTED Final   Candida  glabrata NOT DETECTED NOT DETECTED Final   Candida krusei NOT DETECTED NOT DETECTED Final   Candida parapsilosis NOT DETECTED NOT DETECTED Final   Candida tropicalis NOT DETECTED NOT DETECTED Final   Cryptococcus neoformans/gattii NOT DETECTED NOT DETECTED Final    Comment: Performed at [facility], [address], [place], [place] [address]  MRSA Next Gen by PCR, Nasal     Status: Abnormal   Collection Time: 03/13/23  2:25 AM   Specimen: Nasal Mucosa; Nasal Swab  Result Value Ref Range Status   MRSA by PCR Next Gen DETECTED (A) NOT DETECTED Final    Comment: (NOTE) The GeneXpert MRSA Assay (FDA approved for NASAL specimens only), is one component of a comprehensive MRSA colonization surveillance program. It is not intended to diagnose MRSA infection nor to guide or monitor treatment for MRSA infections. Test performance is not FDA approved in patients less than 79 years old. Performed at [facility], [address] [place], [place] [address]   Aerobic Culture w Gram Stain (superficial specimen)     Status: None   Collection Time: 03/13/23  1:38 PM   Specimen: Wound  Result Value Ref Range Status   Specimen Description   Final    WOUND Performed at [facility], [address] [place], [place] [address]    Special Requests   Final    LEFT LEG Performed at [facility], [address], [place], [place] [address]    Gram Stain   Final    FEW WBC PRESENT, PREDOMINANTLY PMN FEW GRAM POSITIVE COCCI Performed at [facility], [address], [place], [place] [address]    Culture   Final    ABUNDANT METHICILLIN RESISTANT STAPHYLOCOCCUS AUREUS   Report Status 03/15/2023 FINAL  Final   Organism ID, Bacteria METHICILLIN RESISTANT STAPHYLOCOCCUS AUREUS  Final      Susceptibility   Methicillin resistant staphylococcus aureus - MIC*    CIPROFLOXACIN >=8 RESISTANT Resistant     ERYTHROMYCIN >=8 RESISTANT Resistant     GENTAMICIN <=0.5 SENSITIVE Sensitive     OXACILLIN >=4 RESISTANT Resistant     TETRACYCLINE <=1 SENSITIVE Sensitive     VANCOMYCIN 1 SENSITIVE Sensitive     TRIMETH/SULFA <=10 SENSITIVE Sensitive     CLINDAMYCIN >=8 RESISTANT Resistant     RIFAMPIN <=0.5 SENSITIVE Sensitive     Inducible Clindamycin NEGATIVE Sensitive     * ABUNDANT METHICILLIN RESISTANT STAPHYLOCOCCUS AUREUS     Labs: BNP (last 3 results) No results for input(s): "BNP" in the last 8760 hours. Basic Metabolic Panel: Recent Labs  Lab 03/12/23 1716 03/13/23 0247 03/15/23 0605  NA 132* 135  --   K 3.8 3.9  --   CL 99 103  --   CO2 21* 23  --   GLUCOSE 318* 177*  --   BUN 11 9  --   CREATININE 0.63 0.55 0.62  CALCIUM 8.3* 8.2*  --   MG  --  1.6*  --   PHOS  --  4.3  --    Liver Function Tests: Recent Labs  Lab 03/12/23 1716  AST 37  ALT 43  ALKPHOS 65  BILITOT 0.4  PROT 7.1  ALBUMIN 3.4*   No results for input(s):  "LIPASE", "AMYLASE" in the last 168 hours. No results for input(s): "AMMONIA" in the last 168 hours. CBC: Recent Labs  Lab 03/12/23 1716 03/13/23 0247 03/14/23 0822  WBC 12.8* 13.5* 11.0*  NEUTROABS 9.0*  --   --   HGB 11.3* 10.7* 11.1*  HCT 35.5* 33.5* 35.8*  MCV 85.7 87.2 88.2  PLT 304 289 301   Cardiac Enzymes: No results for input(s): "CKTOTAL", "CKMB", "CKMBINDEX", "TROPONINI" in the last 168 hours. BNP: Invalid input(s): "POCBNP" CBG: Recent Labs  Lab 03/14/23 0707 03/14/23 1206 03/14/23 1703 03/14/23 2014 03/15/23 0718  GLUCAP 204* 229* 241* 273* 260*   D-Dimer No results for input(s): "DDIMER" in the last 72 hours. Hgb A1c Recent Labs    03/13/23 0247  HGBA1C 8.2*   Lipid Profile No results for input(s): "CHOL", "HDL", "LDLCALC", "TRIG", "CHOLHDL", "LDLDIRECT" in the last 72 hours. Thyroid function studies No results for input(s): "TSH", "T4TOTAL", "T3FREE", "THYROIDAB" in the last 72 hours.  Invalid input(s): "FREET3" Anemia work up No results for input(s): "VITAMINB12", "FOLATE", "FERRITIN", "TIBC", "IRON", "RETICCTPCT" in the last 72 hours. Urinalysis    Component Value Date/Time   COLORURINE YELLOW 03/12/2023 1716   APPEARANCEUR CLOUDY (A) 03/12/2023 1716   LABSPEC >=1.030 03/12/2023 1716   PHURINE 5.5 03/12/2023 1716   GLUCOSEU >=500 (A) 03/12/2023 1716   HGBUR TRACE (A) 03/12/2023 1716   BILIRUBINUR NEGATIVE 03/12/2023 1716   KETONESUR NEGATIVE 03/12/2023 1716   PROTEINUR NEGATIVE 03/12/2023 1716   UROBILINOGEN 0.2 08/16/2015 1000   NITRITE NEGATIVE 03/12/2023 1716   LEUKOCYTESUR NEGATIVE 03/12/2023 1716   Sepsis Labs Recent Labs  Lab 03/12/23 1716 03/13/23 0247 03/14/23 0822  WBC 12.8* 13.5* 11.0*   Microbiology Recent Results (from the past 240 hour(s))  Resp panel by RT-PCR (RSV, Flu A&B, Covid) Anterior Nasal Swab     Status: None   Collection Time: 03/12/23  6:26 PM   Specimen: Anterior Nasal Swab  Result Value Ref Range  Status   SARS Coronavirus 2 by RT PCR NEGATIVE NEGATIVE Final    Comment: (NOTE) SARS-CoV-2 target nucleic acids are NOT DETECTED.  The SARS-CoV-2 RNA is generally detectable in upper respiratory specimens during the acute phase of infection. The lowest concentration of SARS-CoV-2 viral copies this assay can detect is 138 copies/mL. A negative result does not preclude SARS-Cov-2 infection and should not be used as the sole basis for treatment or other patient management decisions. A negative result may occur with  improper specimen collection/handling, submission of specimen other than nasopharyngeal swab, presence of viral mutation(s) within the areas targeted by this assay, and inadequate number of viral copies(<138 copies/mL). A negative result must be combined with clinical observations, patient history, and epidemiological information. The expected result is Negative.  Fact Sheet for Patients:  BloggerCourse.com  Fact Sheet for Healthcare Providers:  SeriousBroker.it  This test is no t yet approved or cleared by the [place] FDA and  has been authorized for detection and/or diagnosis of SARS-CoV-2 by FDA under an Emergency Use Authorization (EUA). This EUA will remain  in effect (meaning this test can be used) for the duration of the COVID-19 declaration under Section 564(b)(1) of the Act, 21 U.S.C.section 360bbb-3(b)(1), unless the authorization is terminated  or revoked sooner.       Influenza A by PCR NEGATIVE NEGATIVE Final   Influenza B by PCR NEGATIVE NEGATIVE Final    Comment: (NOTE) The Xpert Xpress SARS-CoV-2/FLU/RSV plus assay is intended as an aid in the diagnosis of influenza from Nasopharyngeal swab specimens and should not be used as a sole basis for treatment. Nasal washings and aspirates are unacceptable for Xpert Xpress SARS-CoV-2/FLU/RSV testing.  Fact Sheet for  Patients: BloggerCourse.com  Fact  Sheet for Healthcare Providers: SeriousBroker.it  This test is not yet approved or cleared by the [place] and has been authorized for detection and/or diagnosis of SARS-CoV-2 by FDA under an Emergency Use Authorization (EUA). This EUA will remain in effect (meaning this test can be used) for the duration of the COVID-19 declaration under Section 564(b)(1) of the Act, 21 U.S.C. section 360bbb-3(b)(1), unless the authorization is terminated or revoked.     Resp Syncytial Virus by PCR NEGATIVE NEGATIVE Final    Comment: (NOTE) Fact Sheet for Patients: BloggerCourse.com  Fact Sheet for Healthcare Providers: SeriousBroker.it  This test is not yet approved or cleared by the [place] FDA and has been authorized for detection and/or diagnosis of SARS-CoV-2 by FDA under an Emergency Use Authorization (EUA). This EUA will remain in effect (meaning this test can be used) for the duration of the COVID-19 declaration under Section 564(b)(1) of the Act, 21 U.S.C. section 360bbb-3(b)(1), unless the authorization is terminated or revoked.  Performed at [facility], [address] [place], [place] [address]   Blood culture (routine x 2)     Status: None (Preliminary result)   Collection Time: 03/12/23  6:47 PM   Specimen: BLOOD  Result Value Ref Range Status   Specimen Description   Final    BLOOD BOTTLES DRAWN AEROBIC ONLY Performed at [facility], [address] [place], [place] [address]    Special Requests   Final    BLOOD LEFT HAND Blood Culture adequate volume Performed at [facility], [address] [place], [place] [address]    Culture   Final    NO GROWTH 3 DAYS Performed at [facility], [address], [place], [place] [address]    Report Status PENDING  Incomplete  Blood culture  (routine x 2)     Status: Abnormal   Collection Time: 03/12/23  6:52 PM   Specimen: BLOOD  Result Value Ref Range Status   Specimen Description   Final    BLOOD BOTTLES DRAWN AEROBIC AND ANAEROBIC Performed at [facility], [address] [place], [place] [address]    Special Requests   Final    RIGHT ANTECUBITAL Blood Culture adequate volume Performed at [facility], [address] [place], [place] [address]    Culture  Setup Time   Final    GRAM POSITIVE COCCI IN CLUSTERS AEROBIC BOTTLE ONLY CRITICAL RESULT CALLED TO, READ BACK BY AND VERIFIED WITH: PHARMD DREW WOFFORD ON 03/13/23 @ 2136 BY DRT    Culture (A)  Final    STAPHYLOCOCCUS HOMINIS THE SIGNIFICANCE OF ISOLATING THIS ORGANISM FROM A SINGLE SET OF BLOOD CULTURES WHEN MULTIPLE SETS ARE DRAWN IS UNCERTAIN. PLEASE NOTIFY THE MICROBIOLOGY DEPARTMENT WITHIN ONE WEEK IF SPECIATION AND SENSITIVITIES ARE REQUIRED. Performed at [facility], [address] [place], [place] [address]    Report Status 03/15/2023 FINAL  Final  Blood Culture ID Panel (Reflexed)     Status: Abnormal   Collection Time: 03/12/23  6:52 PM  Result Value Ref Range Status   Enterococcus faecalis NOT DETECTED NOT DETECTED Final   Enterococcus Faecium NOT DETECTED NOT DETECTED Final   Listeria monocytogenes NOT DETECTED NOT DETECTED Final   Staphylococcus species DETECTED (A) NOT DETECTED Final    Comment: CRITICAL RESULT CALLED TO, READ BACK BY AND VERIFIED WITH: PHARMD DREW WOFFORD ON 03/13/23 @ 2136 BY DRT  Staphylococcus aureus (BCID) NOT DETECTED NOT DETECTED Final   Staphylococcus epidermidis NOT DETECTED NOT DETECTED Final   Staphylococcus lugdunensis NOT DETECTED NOT DETECTED Final   Streptococcus species NOT DETECTED NOT DETECTED Final   Streptococcus agalactiae NOT DETECTED NOT DETECTED Final   Streptococcus pneumoniae NOT DETECTED NOT DETECTED Final   Streptococcus pyogenes NOT DETECTED NOT DETECTED Final    A.calcoaceticus-baumannii NOT DETECTED NOT DETECTED Final   Bacteroides fragilis NOT DETECTED NOT DETECTED Final   Enterobacterales NOT DETECTED NOT DETECTED Final   Enterobacter cloacae complex NOT DETECTED NOT DETECTED Final   Escherichia coli NOT DETECTED NOT DETECTED Final   Klebsiella aerogenes NOT DETECTED NOT DETECTED Final   Klebsiella oxytoca NOT DETECTED NOT DETECTED Final   Klebsiella pneumoniae NOT DETECTED NOT DETECTED Final   Proteus species NOT DETECTED NOT DETECTED Final   Salmonella species NOT DETECTED NOT DETECTED Final   Serratia marcescens NOT DETECTED NOT DETECTED Final   Haemophilus influenzae NOT DETECTED NOT DETECTED Final   Neisseria meningitidis NOT DETECTED NOT DETECTED Final   Pseudomonas aeruginosa NOT DETECTED NOT DETECTED Final   Stenotrophomonas maltophilia NOT DETECTED NOT DETECTED Final   Candida albicans NOT DETECTED NOT DETECTED Final   Candida auris NOT DETECTED NOT DETECTED Final   Candida glabrata NOT DETECTED NOT DETECTED Final   Candida krusei NOT DETECTED NOT DETECTED Final   Candida parapsilosis NOT DETECTED NOT DETECTED Final   Candida tropicalis NOT DETECTED NOT DETECTED Final   Cryptococcus neoformans/gattii NOT DETECTED NOT DETECTED Final    Comment: Performed at [facility], [address], [place], [place] [address]  MRSA Next Gen by PCR, Nasal     Status: Abnormal   Collection Time: 03/13/23  2:25 AM   Specimen: Nasal Mucosa; Nasal Swab  Result Value Ref Range Status   MRSA by PCR Next Gen DETECTED (A) NOT DETECTED Final    Comment: (NOTE) The GeneXpert MRSA Assay (FDA approved for NASAL specimens only), is one component of a comprehensive MRSA colonization surveillance program. It is not intended to diagnose MRSA infection nor to guide or monitor treatment for MRSA infections. Test performance is not FDA approved in patients less than 81 years old. Performed at [facility], [address] [place], [place] [address]   Aerobic Culture w Gram Stain (superficial specimen)     Status: None   Collection Time: 03/13/23  1:38 PM   Specimen: Wound  Result Value Ref Range Status   Specimen Description   Final    WOUND Performed at [facility], [address], [place], [place] [address]    Special Requests   Final    LEFT LEG Performed at [facility], [address], [place], [place] [address]    Gram Stain   Final    FEW WBC PRESENT, PREDOMINANTLY PMN FEW GRAM POSITIVE COCCI Performed at [facility], [address], [place], [place] [address]    Culture   Final    ABUNDANT METHICILLIN RESISTANT STAPHYLOCOCCUS AUREUS   Report Status 03/15/2023 FINAL  Final   Organism ID, Bacteria METHICILLIN RESISTANT STAPHYLOCOCCUS AUREUS  Final      Susceptibility   Methicillin resistant staphylococcus aureus - MIC*    CIPROFLOXACIN >=8 RESISTANT Resistant     ERYTHROMYCIN >=8 RESISTANT Resistant     GENTAMICIN <=0.5 SENSITIVE Sensitive     OXACILLIN >=4 RESISTANT Resistant     TETRACYCLINE <=1 SENSITIVE Sensitive     VANCOMYCIN  1 SENSITIVE Sensitive     TRIMETH/SULFA <=10 SENSITIVE Sensitive     CLINDAMYCIN >=8 RESISTANT Resistant     RIFAMPIN <=0.5 SENSITIVE Sensitive     Inducible Clindamycin NEGATIVE Sensitive     * ABUNDANT METHICILLIN RESISTANT STAPHYLOCOCCUS AUREUS    Please note: You were cared for by a hospitalist during your hospital stay. Once you are discharged, your primary care physician will handle any further medical issues. Please note that NO REFILLS for any discharge medications will be authorized once you are discharged, as it is imperative that you return to your primary care physician (or establish a relationship with a primary care physician if you do not have one) for your post hospital discharge needs so that they can reassess your need for medications and monitor your lab values.    Time coordinating discharge:  40 minutes  SIGNED:   Burnadette Pop, MD  [facility] 03/15/2023, 11:31 AM Pager [phone]  If 7PM-7AM, please contact night-coverage [email or web address] Password [email or web address]

## 2023-03-15 NOTE — Progress Notes (Signed)
Mobility Specialist - Progress Note   03/15/23 1014  Mobility  Activity Ambulated with assistance in hallway  Level of Assistance Standby assist, set-up cues, supervision of patient - no hands on  Assistive Device None  Distance Ambulated (ft) 400 ft  Activity Response Tolerated well  Mobility Referral Yes  $Mobility charge 1 Mobility   Pt received in bed and agreed to mobility, some dizziness nearing beginning of session, faded with time. Pt returned to chair with all needs met.  Marilynne Halsted Mobility Specialist

## 2023-03-17 LAB — CULTURE, BLOOD (ROUTINE X 2)

## 2023-04-22 ENCOUNTER — Other Ambulatory Visit: Payer: Self-pay

## 2023-04-22 ENCOUNTER — Encounter (HOSPITAL_BASED_OUTPATIENT_CLINIC_OR_DEPARTMENT_OTHER): Payer: Self-pay | Admitting: Emergency Medicine

## 2023-04-22 ENCOUNTER — Emergency Department (HOSPITAL_BASED_OUTPATIENT_CLINIC_OR_DEPARTMENT_OTHER)
Admission: EM | Admit: 2023-04-22 | Discharge: 2023-04-22 | Disposition: A | Discharge status: Home / Self Care | Payer: 59 | Attending: Emergency Medicine | Admitting: Emergency Medicine

## 2023-04-22 DIAGNOSIS — M5412 Radiculopathy, cervical region: Secondary | ICD-10-CM | POA: Diagnosis not present

## 2023-04-22 DIAGNOSIS — M4802 Spinal stenosis, cervical region: Secondary | ICD-10-CM | POA: Diagnosis not present

## 2023-04-22 DIAGNOSIS — Z794 Long term (current) use of insulin: Secondary | ICD-10-CM | POA: Insufficient documentation

## 2023-04-22 DIAGNOSIS — Z7984 Long term (current) use of oral hypoglycemic drugs: Secondary | ICD-10-CM | POA: Insufficient documentation

## 2023-04-22 DIAGNOSIS — E119 Type 2 diabetes mellitus without complications: Secondary | ICD-10-CM | POA: Diagnosis not present

## 2023-04-22 DIAGNOSIS — M542 Cervicalgia: Secondary | ICD-10-CM | POA: Diagnosis present

## 2023-04-22 HISTORY — DX: Type 2 diabetes mellitus without complications: E11.9

## 2023-04-22 MED ORDER — METHYLPREDNISOLONE 4 MG PO TBPK: ORAL_TABLET | ORAL | 0 refills | Status: AC

## 2023-04-22 MED ORDER — KETOROLAC TROMETHAMINE 60 MG/2ML IM SOLN
60.0000 mg | Freq: Once | INTRAMUSCULAR | Status: AC
  Administered 2023-04-22: 60 mg via INTRAMUSCULAR
  Filled 2023-04-22: qty 2

## 2023-04-22 MED ORDER — OXYCODONE HCL 5 MG PO TABS
5.0000 mg | ORAL_TABLET | Freq: Once | ORAL | Status: AC
  Administered 2023-04-22: 5 mg via ORAL
  Filled 2023-04-22: qty 1

## 2023-04-22 MED ORDER — ACETAMINOPHEN 500 MG PO TABS
1000.0000 mg | ORAL_TABLET | Freq: Once | ORAL | Status: AC
  Administered 2023-04-22: 1000 mg via ORAL
  Filled 2023-04-22: qty 2

## 2023-04-22 MED ORDER — GABAPENTIN 300 MG PO CAPS: 300.0000 mg | ORAL_CAPSULE | Freq: Three times a day (TID) | ORAL | 0 refills | Status: AC

## 2023-04-22 MED ORDER — OXYCODONE HCL 5 MG PO TABS: 5.0000 mg | ORAL_TABLET | ORAL | 0 refills | Status: AC | PRN

## 2023-04-22 MED ORDER — CYCLOBENZAPRINE HCL 10 MG PO TABS: 10.0000 mg | ORAL_TABLET | Freq: Two times a day (BID) | ORAL | 0 refills | Status: AC | PRN

## 2023-04-22 MED ORDER — CYCLOBENZAPRINE HCL 10 MG PO TABS
10.0000 mg | ORAL_TABLET | Freq: Once | ORAL | Status: AC
  Administered 2023-04-22: 10 mg via ORAL
  Filled 2023-04-22: qty 1

## 2023-04-22 NOTE — ED Notes (Signed)
During initial rounding pt states that she isn't going to put a gown on or do any xray exams because she isn't going to take off her insulin meter on her arm (dexcom). Pt sitting on bed and requested socks. Socks provided

## 2023-04-22 NOTE — ED Provider Notes (Signed)
[place] [facility] Provider Note   CSN: [id number] Arrival date & time: 04/22/23  1232     History  Chief Complaint  Patient presents with   Back Pain    Glenda Mann is a 37 y.o. female.  HPI     37 year old female with a history of type 2 diabetes, cervical radiculopathy, admission in April for sepsis secondary left thigh abscess/cellulitis, hidradenitis, who presents with concern for continued and severe neck pain.  Reports that she has been undergoing outpatient evaluation and seeing orthopedic surgeon for neck pain with radiation to the left arm which have been diagnosed as a cervical stenosis.  Reports the pain has been ongoing for about 4 months.  Reports that prior to receiving her MRI, she needed to complete 6 weeks of physical therapy.  She had been working with physical therapy but does not feel that she has had any significant improvement of her pain.  She has been on several medications over the last 4 months, including tramadol at times, was started on gabapentin, other muscle relaxants but did not feel they worked and stopped those.  She recently had not been taking her oxycodone as she was not feeling it helped.  She took Percocet of her sisters and felt that the combination of oxycodone and Tylenol was more helpful for pain.  Reports that the pain has been persistent and severe, and today while she was at church she felt she was not able to be comfortable with the pain and came to the emergency department seeking pain relief.  She had an injection for her spine and just feels she did not provide any relief.  She is scheduled to see her doctor again on the 29th.  Reports she has been taking ibuprofen.  No new falls or trauma.  Denies fever, history of IV drug use, history of joint infection.  She has not had any significant chest pain, shortness of breath, abdominal pain, nausea and vomiting.  Reports that her sugars have been better.   Denies loss of control of bowel or bladder.  Reports she has some numbness to her left forearm, and pain radiating down her left arm which she also attributes to her cervical stenosis.  She denies any weakness.  Reports some pain at times in the back of both of her calves that she is walking.  Had MRI 4/29 which showed severe spinal canal stenosis which a large central disc extrusion which indents and deforms the ventral spinal cord with cord flattening but no cord signal abnormality at level 5 C5/C6, mild to moderate spinal canal stenosis at C4-C5, C6-C7, C3-C4 with mild spinal canal stenosis and mild left neural foraminal narrowing, diffusely decreased marrow signal which is nonspecific.    Past Medical History:  Diagnosis Date   BV (bacterial vaginosis)    Chlamydia    DM II (diabetes mellitus, type II), controlled (HCC)    Gallstones    Gestational diabetes    Hidradenitis suppurativa    HSV (herpes simplex virus) infection    Shingles    Sleep apnea      Home Medications Prior to Admission medications   Medication Sig Start Date End Date Taking? Authorizing Provider  cyclobenzaprine (FLEXERIL) 10 MG tablet Take 1 tablet (10 mg total) by mouth 2 (two) times daily as needed for muscle spasms. 04/22/23  Yes Alvira Monday, MD  gabapentin (NEURONTIN) 300 MG capsule Take 1 capsule (300 mg total) by mouth 3 (three) times  daily. 04/22/23 05/22/23 Yes Alvira Monday, MD  methylPREDNISolone (MEDROL DOSEPAK) 4 MG TBPK tablet See instructions 04/22/23  Yes Alvira Monday, MD  oxyCODONE (ROXICODONE) 5 MG immediate release tablet Take 1 tablet (5 mg total) by mouth every 4 (four) hours as needed for severe pain. 04/22/23  Yes Alvira Monday, MD  acetaminophen (TYLENOL) 500 MG tablet Take 1 tablet (500 mg total) by mouth every 6 (six) hours as needed. Patient not taking: Reported on 03/13/2023 01/21/23   Redwine, Madison A, PA-C  Adalimumab (HUMIRA, 2 PEN,) 40 MG/0.4ML PNKT Inject 40 mg into the  skin every Monday.    [provider]  albuterol (VENTOLIN HFA) 108 (90 Base) MCG/ACT inhaler Inhale 1-2 puffs into the lungs every 6 (six) hours as needed for wheezing or shortness of breath. Patient taking differently: Inhale 2 puffs into the lungs every 6 (six) hours as needed for wheezing or shortness of breath. 02/01/23   Sherian Maroon A, PA  amLODipine (NORVASC) 5 MG tablet Take 1 tablet (5 mg total) by mouth daily. 03/15/23 04/14/23  Burnadette Pop, MD  Blood Pressure KIT 1 each by Does not apply route 2 (two) times daily. 03/15/23   Burnadette Pop, MD  budesonide-formoterol (SYMBICORT) 160-4.5 MCG/ACT inhaler Inhale 2 puffs into the lungs daily as needed (wheezing, shortness of breath).    [provider]  ergocalciferol (VITAMIN D2) 1.25 MG (50000 UT) capsule Take 50,000 Units by mouth once a week.    [provider]  glipiZIDE (GLUCOTROL) 10 MG tablet Take 10 mg by mouth 2 (two) times daily.    [provider]  ibuprofen (ADVIL) 800 MG tablet Take 800 mg by mouth daily as needed for moderate pain.    [provider]  insulin lispro (HUMALOG) 100 UNIT/ML injection Inject 200 Units into the skin every 3 (three) days. Load 200 units into pump every 3 days.    [provider]  levonorgestrel (MIRENA) 20 MCG/24HR IUD 1 each by Intrauterine route once.    [provider]  lidocaine (LIDODERM) 5 % Place 2 patches onto the skin daily. Remove & Discard patch within 12 hours or as directed by MD 03/15/23   Burnadette Pop, MD  metFORMIN (GLUCOPHAGE-XR) 500 MG 24 hr tablet Take 2,000 mg by mouth daily with breakfast.    [provider]  methocarbamol (ROBAXIN) 500 MG tablet Take 1 tablet (500 mg total) by mouth 2 (two) times daily. Patient not taking: Reported on 03/13/2023 01/21/23   Redwine, Madison A, PA-C  metroNIDAZOLE (METROGEL) 0.75 % vaginal gel Place 1 Applicatorful vaginally at bedtime. 5 day course.    [provider]  tirzepatide Greggory Keen) 7.5 MG/0.5ML Pen Inject 7.5 mg into the skin every Monday.    [provider]      Allergies    Bactrim [sulfamethoxazole-trimethoprim], Penicillins, Reglan [metoclopramide], and Ritalin [methylphenidate]    Review of Systems   Review of Systems  Physical Exam Updated Vital Signs BP (!) 141/96   Pulse 93   Temp (!) 97.3 F (36.3 C)   Resp 18   Ht 5\' 5"  (1.651 m)   Wt 120.2 kg   SpO2 99%   BMI 44.10 kg/m  Physical Exam Vitals and nursing note reviewed.  Constitutional:      General: She is not in acute distress.    Appearance: She is well-developed. She is not diaphoretic.  HENT:     Head: Normocephalic and atraumatic.  Eyes:     Conjunctiva/sclera: Conjunctivae normal.  Cardiovascular:     Rate and Rhythm: Normal rate and regular rhythm.     Heart sounds: Normal heart sounds. No murmur heard.    No friction rub. No gallop.  Pulmonary:     Effort: Pulmonary effort is normal. No respiratory distress.     Breath sounds: Normal breath sounds. No wheezing or rales.  Abdominal:     General: There is no distension.     Palpations: Abdomen is soft.     Tenderness: There is no abdominal tenderness. There is no guarding.  Musculoskeletal:        General: Tenderness (left paraspinal and trapezius, upper thoracic) present.     Cervical back: Normal range of motion.  Skin:    General: Skin is warm and dry.     Findings: No erythema or rash.  Neurological:     Mental Status: She is alert and oriented to person, place, and time.     Sensory: No sensory deficit (denies numbness at this time, reports pain with palpation of left arm).     Motor: No weakness.     Comments: No clonus Normal finger abduction, opponens, wrist flexion, grip strength, elbow flexion, extension, and shoulder flexion     ED Results / Procedures / Treatments   Labs (all labs ordered are listed, but only abnormal results are displayed) Labs Reviewed - No data to  display  EKG None  Radiology No results found.  Procedures Procedures    Medications Ordered in ED Medications  ketorolac (TORADOL) injection 60 mg (60 mg Intramuscular Given 04/22/23 1345)  oxyCODONE (Oxy IR/ROXICODONE) immediate release tablet 5 mg (5 mg Oral Given 04/22/23 1342)  acetaminophen (TYLENOL) tablet 1,000 mg (1,000 mg Oral Given 04/22/23 1342)  cyclobenzaprine (FLEXERIL) tablet 10 mg (10 mg Oral Given 04/22/23 1342)    ED Course/ Medical Decision Making/ A&P                             37 year old female with a history of type 2 diabetes, cervical radiculopathy, admission in April for sepsis secondary left thigh abscess/cellulitis, hidradenitis, who presents with concern for continued and severe neck pain.    Presents with symptoms of neck pain with radiation of the left arm, consistent with her known cervical stenosis and radiculopathy.  She has normal pulses bilaterally, low suspicion for acute arterial thrombus, aortic dissection by duration of systems, history and exam, ACS, DVT.  Do not suspect septic arthritis, and in absence of fever and MRI since symptoms began have low suspicion for epidural abscess.  She does not have any weakness, no hyperreflexia, no indication for emergent surgery.  Discussed that I recommend continued pain control and close follow-up with orthopedic physician.  Discussed the risks of narcotic medications including addiction, sedation.  Discussed the risks of steroids in the setting of her diabetes for hyperglycemia, however given her pain level, at this time feel it is reasonable to do systemic steroids with continued close monitoring of her glucose.  Her last prescription for oxycodone was from orthopedic doctor on 9 May.  Feel is reasonable to give her a few tablets for continued pain control until her follow-up on the 29th as it is a holiday weekend.  She is given a prescription for Medrol Dosepak, Flexeril, short prescription for oxycodone, and  recommend Tylenol, ibuprofen, lidocaine patch, and taking the gabapentin that she had been previously prescribed. Patient discharged in stable condition with understanding of reasons to  return.         Final Clinical Impression(s) / ED Diagnoses Final diagnoses:  Cervical stenosis of spine  Cervical radiculopathy    Rx / DC Orders ED Discharge Orders          Ordered    methylPREDNISolone (MEDROL DOSEPAK) 4 MG TBPK tablet        04/22/23 1333    oxyCODONE (ROXICODONE) 5 MG immediate release tablet  Every 4 hours PRN        04/22/23 1333    cyclobenzaprine (FLEXERIL) 10 MG tablet  2 times daily PRN        04/22/23 1333    gabapentin (NEURONTIN) 300 MG capsule  3 times daily        04/22/23 1333              Alvira Monday, MD 04/22/23 1351

## 2023-04-22 NOTE — ED Triage Notes (Signed)
Pt with chronic upper back pain w/ radiation to LUE, worse since receiving injection for it Monday

## 2023-04-22 NOTE — Discharge Instructions (Addendum)
For your pain, you may take up to 1000mg  of acetaminophen (tylenol) 4 times daily for up to a week. This is the maximum dose of acetminophen (tylenol) you can take from all sources. Please check other over-the-counter medications and prescriptions to ensure you are not taking other medications that contain acetaminophen.  You may also take ibuprofen 400 mg 6 times a day OR 600mg  4 times a day alternating with or at the same time as tylenol.  Take oxycodone as needed for breakthrough pain.  This medication can be addicting, sedating and cause constipation.     Monitor your sugars while you are on the steroid.  Begin your gabapentin, this medication can be titrated up by your regular doctor. Take the muscle relaxant as needed at a different time than the oxycodone.  You may use all of these medications with your lidocaine patch.  Avoid taking the oxycodone as this can worsen pain in the long run.

## 2023-07-17 ENCOUNTER — Emergency Department (HOSPITAL_BASED_OUTPATIENT_CLINIC_OR_DEPARTMENT_OTHER)
Admission: EM | Admit: 2023-07-17 | Discharge: 2023-07-17 | Disposition: A | Discharge status: Home / Self Care | Payer: 59 | Attending: Emergency Medicine | Admitting: Emergency Medicine

## 2023-07-17 ENCOUNTER — Other Ambulatory Visit: Payer: Self-pay

## 2023-07-17 ENCOUNTER — Emergency Department (HOSPITAL_BASED_OUTPATIENT_CLINIC_OR_DEPARTMENT_OTHER): Payer: 59

## 2023-07-17 ENCOUNTER — Encounter (HOSPITAL_BASED_OUTPATIENT_CLINIC_OR_DEPARTMENT_OTHER): Payer: Self-pay

## 2023-07-17 DIAGNOSIS — Z7984 Long term (current) use of oral hypoglycemic drugs: Secondary | ICD-10-CM | POA: Diagnosis not present

## 2023-07-17 DIAGNOSIS — M542 Cervicalgia: Secondary | ICD-10-CM

## 2023-07-17 DIAGNOSIS — L732 Hidradenitis suppurativa: Secondary | ICD-10-CM | POA: Diagnosis not present

## 2023-07-17 DIAGNOSIS — E119 Type 2 diabetes mellitus without complications: Secondary | ICD-10-CM | POA: Diagnosis not present

## 2023-07-17 DIAGNOSIS — Z794 Long term (current) use of insulin: Secondary | ICD-10-CM | POA: Diagnosis not present

## 2023-07-17 LAB — URINALYSIS, ROUTINE W REFLEX MICROSCOPIC
Bilirubin Urine: NEGATIVE
Glucose, UA: NEGATIVE mg/dL
Hgb urine dipstick: NEGATIVE
Ketones, ur: NEGATIVE mg/dL
Leukocytes,Ua: NEGATIVE
Nitrite: NEGATIVE
Protein, ur: NEGATIVE mg/dL
Specific Gravity, Urine: 1.025 (ref 1.005–1.030)
pH: 6 (ref 5.0–8.0)

## 2023-07-17 LAB — WET PREP, GENITAL
Clue Cells Wet Prep HPF POC: NONE SEEN
Sperm: NONE SEEN
Trich, Wet Prep: NONE SEEN
WBC, Wet Prep HPF POC: <10 (ref <10)
Yeast Wet Prep HPF POC: NONE SEEN

## 2023-07-17 LAB — PREGNANCY, URINE: Preg Test, Ur: NEGATIVE

## 2023-07-17 MED ORDER — LIDOCAINE 5 % EX PTCH: 1.0000 | MEDICATED_PATCH | CUTANEOUS | 0 refills | Status: AC

## 2023-07-17 MED ORDER — METHOCARBAMOL 1000 MG/10ML IJ SOLN
INTRAMUSCULAR | Status: AC
  Filled 2023-07-17: qty 10

## 2023-07-17 MED ORDER — FLUCONAZOLE 150 MG PO TABS: 150.0000 mg | ORAL_TABLET | Freq: Every day | ORAL | 0 refills | Status: DC

## 2023-07-17 MED ORDER — METHOCARBAMOL 500 MG PO TABS: 500.0000 mg | ORAL_TABLET | Freq: Two times a day (BID) | ORAL | 0 refills | Status: DC

## 2023-07-17 MED ORDER — METHOCARBAMOL 1000 MG/10ML IJ SOLN
500.0000 mg | INTRAVENOUS | Status: AC
  Administered 2023-07-17: 500 mg via INTRAVENOUS
  Filled 2023-07-17: qty 5

## 2023-07-17 MED ORDER — METAXALONE 800 MG PO TABS: 800.0000 mg | ORAL_TABLET | Freq: Three times a day (TID) | ORAL | 0 refills | Status: AC

## 2023-07-17 MED ORDER — DOXYCYCLINE HYCLATE 100 MG PO TABS
100.0000 mg | ORAL_TABLET | Freq: Once | ORAL | Status: AC
  Administered 2023-07-17: 100 mg via ORAL
  Filled 2023-07-17: qty 1

## 2023-07-17 MED ORDER — DOXYCYCLINE HYCLATE 100 MG PO CAPS: 100.0000 mg | ORAL_CAPSULE | Freq: Two times a day (BID) | ORAL | 0 refills | Status: DC

## 2023-07-17 MED ORDER — MELOXICAM 15 MG PO TABS: 15.0000 mg | ORAL_TABLET | Freq: Every day | ORAL | 0 refills | Status: AC

## 2023-07-17 MED ORDER — KETOROLAC TROMETHAMINE 30 MG/ML IJ SOLN
30.0000 mg | Freq: Once | INTRAMUSCULAR | Status: AC
  Administered 2023-07-17: 30 mg via INTRAVENOUS
  Filled 2023-07-17: qty 1

## 2023-07-17 NOTE — ED Provider Notes (Signed)
[place] [facility] Provider Note   CSN: [id number] Arrival date & time: 07/17/23  0110     History    Glenda Mann is a 37 y.o. female.  The history is provided by the patient.  Illness Location:  Neck s/p surgery with pain also hidradenitis of the right axilla is flaring along with a spot on the lower right back,  also would like STD testing Quality:  Neck is painful Severity:  Moderate Onset quality:  Gradual Timing:  Constant Progression:  Unchanged Chronicity:  Recurrent Context:  Patient is status post surgery in June with recurrent neck pain.  No weakness nor numbness. Relieved by:  Nothing Worsened by:  Nothing Ineffective treatments:  None Associated symptoms: no chest pain, no diarrhea, no fever, no vomiting and no wheezing   Patient with Hidradenitis presents with flare and also neck pain following her surgery in June and would like to be tested for STIs.      Past Medical History:  Diagnosis Date   BV (bacterial vaginosis)    Chlamydia    DM II (diabetes mellitus, type II), controlled (HCC)    Gallstones    Gestational diabetes    Hidradenitis suppurativa    HSV (herpes simplex virus) infection    Shingles    Sleep apnea      Home Medications Prior to Admission medications   Medication Sig Start Date End Date Taking? Authorizing Provider  doxycycline (VIBRAMYCIN) 100 MG capsule Take 1 capsule (100 mg total) by mouth 2 (two) times daily. One po bid x 7 days 07/17/23  Yes Shalayna Ornstein, MD  fluconazole (DIFLUCAN) 150 MG tablet Take 1 tablet (150 mg total) by mouth daily. Post antibiotics 07/17/23  Yes Avantika Shere, MD  lidocaine (LIDODERM) 5 % Place 1 patch onto the skin daily. Remove & Discard patch within 12 hours or as directed by MD 07/17/23  Yes Travis Purk, MD  meloxicam (MOBIC) 15 MG tablet Take 1 tablet (15 mg total) by mouth daily. 07/17/23  Yes Doak Mah, MD  metaxalone (SKELAXIN) 800 MG tablet Take  1 tablet (800 mg total) by mouth 3 (three) times daily. 07/17/23  Yes Dacoda Spallone, MD  methocarbamol (ROBAXIN) 500 MG tablet Take 1 tablet (500 mg total) by mouth 2 (two) times daily. 07/17/23  Yes Alexx Giambra, MD  acetaminophen (TYLENOL) 500 MG tablet Take 1 tablet (500 mg total) by mouth every 6 (six) hours as needed. Patient not taking: Reported on 03/13/2023 01/21/23   Redwine, Madison A, PA-C  Adalimumab (HUMIRA, 2 PEN,) 40 MG/0.4ML PNKT Inject 40 mg into the skin every Monday.    [provider]  albuterol (VENTOLIN HFA) 108 (90 Base) MCG/ACT inhaler Inhale 1-2 puffs into the lungs every 6 (six) hours as needed for wheezing or shortness of breath. Patient taking differently: Inhale 2 puffs into the lungs every 6 (six) hours as needed for wheezing or shortness of breath. 02/01/23   Sherian Maroon A, PA  amLODipine (NORVASC) 5 MG tablet Take 1 tablet (5 mg total) by mouth daily. 03/15/23 04/14/23  Burnadette Pop, MD  Blood Pressure KIT 1 each by Does not apply route 2 (two) times daily. 03/15/23   Burnadette Pop, MD  budesonide-formoterol (SYMBICORT) 160-4.5 MCG/ACT inhaler Inhale 2 puffs into the lungs daily as needed (wheezing, shortness of breath).    [provider]  cyclobenzaprine (FLEXERIL) 10 MG tablet Take 1 tablet (10 mg total) by mouth 2 (two) times daily as needed  for muscle spasms. 04/22/23   Alvira Monday, MD  ergocalciferol (VITAMIN D2) 1.25 MG (50000 UT) capsule Take 50,000 Units by mouth once a week.    [provider]  gabapentin (NEURONTIN) 300 MG capsule Take 1 capsule (300 mg total) by mouth 3 (three) times daily. 04/22/23 05/22/23  Alvira Monday, MD  glipiZIDE (GLUCOTROL) 10 MG tablet Take 10 mg by mouth 2 (two) times daily.    [provider]  ibuprofen (ADVIL) 800 MG tablet Take 800 mg by mouth daily as needed for moderate pain.    [provider]  insulin lispro (HUMALOG) 100 UNIT/ML injection Inject 200 Units into the skin  every 3 (three) days. Load 200 units into pump every 3 days.    [provider]  levonorgestrel (MIRENA) 20 MCG/24HR IUD 1 each by Intrauterine route once.    [provider]  lidocaine (LIDODERM) 5 % Place 2 patches onto the skin daily. Remove & Discard patch within 12 hours or as directed by MD 03/15/23   Burnadette Pop, MD  metFORMIN (GLUCOPHAGE-XR) 500 MG 24 hr tablet Take 2,000 mg by mouth daily with breakfast.    [provider]  methocarbamol (ROBAXIN) 500 MG tablet Take 1 tablet (500 mg total) by mouth 2 (two) times daily. Patient not taking: Reported on 03/13/2023 01/21/23   Redwine, Madison A, PA-C  methylPREDNISolone (MEDROL DOSEPAK) 4 MG TBPK tablet See instructions 04/22/23   Alvira Monday, MD  metroNIDAZOLE (METROGEL) 0.75 % vaginal gel Place 1 Applicatorful vaginally at bedtime. 5 day course.    [provider]  oxyCODONE (ROXICODONE) 5 MG immediate release tablet Take 1 tablet (5 mg total) by mouth every 4 (four) hours as needed for severe pain. 04/22/23   Alvira Monday, MD  tirzepatide [facility]) 7.5 MG/0.5ML Pen Inject 7.5 mg into the skin every Monday.    [provider]      Allergies    Bactrim [sulfamethoxazole-trimethoprim], Penicillins, Reglan [metoclopramide], and Ritalin [methylphenidate]    Review of Systems   Review of Systems  Constitutional:  Negative for fever.  HENT:  Negative for facial swelling.   Respiratory:  Negative for wheezing.   Cardiovascular:  Negative for chest pain.  Gastrointestinal:  Negative for diarrhea and vomiting.  Skin:  Negative for color change and pallor.  All other systems reviewed and are negative.   Physical Exam Updated Vital Signs BP (!) 150/99   Pulse (!) 106   Temp 99.5 F (37.5 C) (Oral)   Resp 15   Wt 121.1 kg   SpO2 97%   BMI 44.43 kg/m  Physical Exam Vitals and nursing note reviewed.  Constitutional:      General: She is not in acute distress.    Appearance: She  is well-developed.  HENT:     Head: Normocephalic and atraumatic.     Nose: Nose normal.  Eyes:     Pupils: Pupils are equal, round, and reactive to light.  Cardiovascular:     Rate and Rhythm: Normal rate and regular rhythm.     Pulses: Normal pulses.     Heart sounds: Normal heart sounds.  Pulmonary:     Effort: Pulmonary effort is normal. No respiratory distress.     Breath sounds: Normal breath sounds.  Abdominal:     General: Bowel sounds are normal. There is no distension.     Palpations: Abdomen is soft. There is no mass.     Tenderness: There is no abdominal tenderness. There is no guarding  or rebound.     Hernia: No hernia is present.  Musculoskeletal:        General: Normal range of motion.     Cervical back: Normal range of motion and neck supple. No rigidity or tenderness.  Lymphadenopathy:     Cervical: No cervical adenopathy.  Skin:    General: Skin is warm and dry.     Capillary Refill: Capillary refill takes less than 2 seconds.     Findings: No erythema or rash.     Comments: Hidradenitis of the right axilla with scarring but no abscess seen on [place]  Neurological:     General: No focal deficit present.     Mental Status: She is oriented to person, place, and time.     Deep Tendon Reflexes: Reflexes normal.  Psychiatric:        Mood and Affect: Mood normal.     ED Results / Procedures / Treatments   Labs (all labs ordered are listed, but only abnormal results are displayed) Labs Reviewed  WET PREP, GENITAL  URINALYSIS, ROUTINE W REFLEX MICROSCOPIC  PREGNANCY, URINE  GC/CHLAMYDIA PROBE AMP ([place]) NOT AT [facility]    EKG None  Radiology CT Cervical Spine Wo Contrast  Result Date: 07/17/2023 CLINICAL DATA:  Neck pain, recent neck surgery EXAM: CT CERVICAL SPINE WITHOUT CONTRAST TECHNIQUE: Multidetector CT imaging of the cervical spine was performed without intravenous contrast. Multiplanar CT image reconstructions were also generated. RADIATION DOSE  REDUCTION: This exam was performed according to the departmental dose-optimization program which includes automated exposure control, adjustment of the mA and/or kV according to patient size and/or use of iterative reconstruction technique. COMPARISON:  05/01/2023 FINDINGS: Alignment: Straightening and mild reversal of the normal cervical lordosis. No listhesis. Skull base and vertebrae: No acute fracture, primary bone lesion, or focal pathologic process. Status post interval ACDF C5-C6. no evidence of hardware fracture or perihardware lucency Soft tissues and spinal canal: No prevertebral fluid or swelling. No visible canal hematoma. No evidence of postoperative fluid collection anterior to the cervical spine or in the anterior soft tissues of the neck. Disc levels: Overall stable appearance of the spinal canal, with small osteophytes at C5-C6. Upper chest: Negative. Other: None. IMPRESSION: Status post interval ACDF C5-C6 without evidence of hardware complication. No evidence of postoperative fluid collection anterior to the cervical spine or in the anterior soft tissues of the neck. Electronically Signed   By: Wiliam Ke M.D.   On: 07/17/2023 03:26    Procedures Procedures    Medications Ordered in ED Medications  methocarbamol (ROBAXIN) 1000 MG/10ML injection (  Not Given 07/17/23 0332)  methocarbamol (ROBAXIN) 1000 MG/10ML injection (  Not Given 07/17/23 0333)  ketorolac (TORADOL) 30 MG/ML injection 30 mg (30 mg Intravenous Given 07/17/23 0300)  methocarbamol (ROBAXIN) 500 mg in dextrose 5 % 50 mL IVPB (0 mg Intravenous Stopped 07/17/23 0336)  doxycycline (VIBRA-TABS) tablet 100 mg (100 mg Oral Given 07/17/23 0300)    ED Course/ Medical Decision Making/ A&P                                 Medical Decision Making Patient with hidradenitis with worsening symptoms also neck pain since surgery.    Amount and/or Complexity of Data Reviewed Labs: ordered.    Details: Pregnancy test is negative  urine is negative for UTI,  wet prep is negative GC and chlamydia sent  Radiology: ordered and independent  interpretation performed.    Details: Hardware in position   Risk Prescription drug management. Risk Details: Hidradenitis treated with doxycycline and a dose of diflucan upon completion, nothing to drain at this time.  Will start mobic and skelaxin for neck pain and have patient follow up with her surgeon for ongoing care.  Exam and vitals are benign and reassuring.  Stable for discharge.      Final Clinical Impression(s) / ED Diagnoses Final diagnoses:  Hidradenitis suppurativa  Neck pain   Return for intractable cough, coughing up blood, fevers > 100.4 unrelieved by medication, shortness of breath, intractable vomiting, chest pain, shortness of breath, weakness, numbness, changes in speech, facial asymmetry, abdominal pain, passing out, Inability to tolerate liquids or food, cough, altered mental status or any concerns. No signs of systemic illness or infection. The patient is nontoxic-appearing on exam and vital signs are within normal limits.  I have reviewed the triage vital signs and the nursing notes. Pertinent labs & imaging results that were available during my care of the patient were reviewed by me and considered in my medical decision making (see chart for details). After history, exam, and medical workup I feel the patient has been appropriately medically screened and is safe for discharge home. Pertinent diagnoses were discussed with the patient. Patient was given return precautions.  Luberta Robertson, Huong Luthi, MD 07/17/23 [phone]

## 2023-07-17 NOTE — ED Triage Notes (Signed)
Pt reports recent neck surgery and now complains of neck and back pain.  Pt also reports abd pain and vaginal discharge and requests STD testing.  Pt also has abscess on back.

## 2023-07-18 LAB — GC/CHLAMYDIA PROBE AMP (~~LOC~~) NOT AT ARMC
Chlamydia: NEGATIVE
Comment: NEGATIVE
Comment: NORMAL
Neisseria Gonorrhea: NEGATIVE

## 2023-11-06 ENCOUNTER — Encounter (HOSPITAL_BASED_OUTPATIENT_CLINIC_OR_DEPARTMENT_OTHER): Payer: Self-pay

## 2023-11-06 ENCOUNTER — Emergency Department (HOSPITAL_BASED_OUTPATIENT_CLINIC_OR_DEPARTMENT_OTHER)
Admission: EM | Admit: 2023-11-06 | Discharge: 2023-11-06 | Disposition: A | Discharge status: Home / Self Care | Payer: 59 | Attending: Emergency Medicine | Admitting: Emergency Medicine

## 2023-11-06 ENCOUNTER — Other Ambulatory Visit: Payer: Self-pay

## 2023-11-06 DIAGNOSIS — L089 Local infection of the skin and subcutaneous tissue, unspecified: Secondary | ICD-10-CM | POA: Diagnosis present

## 2023-11-06 DIAGNOSIS — L739 Follicular disorder, unspecified: Secondary | ICD-10-CM | POA: Diagnosis not present

## 2023-11-06 DIAGNOSIS — L304 Erythema intertrigo: Secondary | ICD-10-CM | POA: Diagnosis not present

## 2023-11-06 DIAGNOSIS — Z794 Long term (current) use of insulin: Secondary | ICD-10-CM | POA: Diagnosis not present

## 2023-11-06 MED ORDER — DOXYCYCLINE HYCLATE 100 MG PO CAPS
100.0000 mg | ORAL_CAPSULE | Freq: Two times a day (BID) | ORAL | 0 refills | Status: AC
Start: 2023-11-06 — End: ?

## 2023-11-06 MED ORDER — FLUCONAZOLE 150 MG PO TABS: 150.0000 mg | ORAL_TABLET | Freq: Every day | ORAL | 0 refills | Status: DC

## 2023-11-06 MED ORDER — CLOTRIMAZOLE 1 % EX CREA
TOPICAL_CREAM | CUTANEOUS | 1 refills | Status: AC
Start: 2023-11-06 — End: ?

## 2023-11-06 NOTE — ED Provider Notes (Signed)
[place] EMERGENCY DEPARTMENT AT [facility] [place] Provider Note   CSN: [id number] Arrival date & time: 11/06/23  1629     History  No chief complaint on file.   Glenda Mann is a 37 y.o. female.  Patient with history of diabetes presents to the emergency department today for several skin complaints.  She reports having previous rash on her lower abdomen bilaterally that has resolved but left behind some dark marks.  More recently she has had pustules, up on her middle back, favoring the right side but crossing the midline to the left side.  These are aching and throbbing in nature.  She also has some pain underneath her breasts and some drainage and a foul odor.  Her blood sugars are normal.  She has a history of hidradenitis and is on Humira for this.  She is also on moxifloxacin that she takes as needed for flares of at bedtime and has been taking for the past several days.  She states that she had to have a spine procedure rescheduled today because of her ongoing skin infection and being on antibiotics.  She did take 1 Valtrex that she had last night without any relief.   Patient reports recently being prescribed metronidazole for bacterial vaginosis which she has not yet started taking.       Home Medications Prior to Admission medications   Medication Sig Start Date End Date Taking? Authorizing Provider  clotrimazole (LOTRIMIN) 1 % cream Apply to under folds of breasts 2 times daily 11/06/23  Yes Renne Crigler, PA-C  doxycycline (VIBRAMYCIN) 100 MG capsule Take 1 capsule (100 mg total) by mouth 2 (two) times daily. 11/06/23  Yes Renne Crigler, PA-C  metFORMIN (GLUCOPHAGE-XR) 500 MG 24 hr tablet Take 2,000 mg by mouth daily with breakfast.   Yes [provider]  moxifloxacin (AVELOX) 400 MG tablet Take 400 mg by mouth daily at 8 pm.   Yes [provider]  spironolactone (ALDACTONE) 25 MG tablet Take 25 mg by mouth daily.   Yes [provider]   tirzepatide Greggory Keen) 7.5 MG/0.5ML Pen Inject 7.5 mg into the skin every Monday.   Yes [provider]  acetaminophen (TYLENOL) 500 MG tablet Take 1 tablet (500 mg total) by mouth every 6 (six) hours as needed. Patient not taking: Reported on 03/13/2023 01/21/23   Redwine, Madison A, PA-C  Adalimumab (HUMIRA, 2 PEN,) 40 MG/0.4ML PNKT Inject 40 mg into the skin every Monday.    [provider]  albuterol (VENTOLIN HFA) 108 (90 Base) MCG/ACT inhaler Inhale 1-2 puffs into the lungs every 6 (six) hours as needed for wheezing or shortness of breath. Patient taking differently: Inhale 2 puffs into the lungs every 6 (six) hours as needed for wheezing or shortness of breath. 02/01/23   Sherian Maroon A, PA  amLODipine (NORVASC) 5 MG tablet Take 1 tablet (5 mg total) by mouth daily. 03/15/23 04/14/23  Burnadette Pop, MD  Blood Pressure KIT 1 each by Does not apply route 2 (two) times daily. 03/15/23   Burnadette Pop, MD  budesonide-formoterol (SYMBICORT) 160-4.5 MCG/ACT inhaler Inhale 2 puffs into the lungs daily as needed (wheezing, shortness of breath).    [provider]  cyclobenzaprine (FLEXERIL) 10 MG tablet Take 1 tablet (10 mg total) by mouth 2 (two) times daily as needed for muscle spasms. 04/22/23   Alvira Monday, MD  ergocalciferol (VITAMIN D2) 1.25 MG (50000 UT) capsule Take 50,000 Units by mouth once a week.  [provider]  fluconazole (DIFLUCAN) 150 MG tablet Take 1 tablet (150 mg total) by mouth daily. Post antibiotics 07/17/23   Palumbo, April, MD  gabapentin (NEURONTIN) 300 MG capsule Take 1 capsule (300 mg total) by mouth 3 (three) times daily. 04/22/23 05/22/23  Alvira Monday, MD  glipiZIDE (GLUCOTROL) 10 MG tablet Take 10 mg by mouth 2 (two) times daily.    [provider]  ibuprofen (ADVIL) 800 MG tablet Take 800 mg by mouth daily as needed for moderate pain.    [provider]  insulin lispro (HUMALOG) 100 UNIT/ML injection Inject  200 Units into the skin every 3 (three) days. Load 200 units into pump every 3 days.    [provider]  levonorgestrel (MIRENA) 20 MCG/24HR IUD 1 each by Intrauterine route once.    [provider]  lidocaine (LIDODERM) 5 % Place 2 patches onto the skin daily. Remove & Discard patch within 12 hours or as directed by MD 03/15/23   Burnadette Pop, MD  lidocaine (LIDODERM) 5 % Place 1 patch onto the skin daily. Remove & Discard patch within 12 hours or as directed by MD 07/17/23   Nicanor Alcon, April, MD  meloxicam (MOBIC) 15 MG tablet Take 1 tablet (15 mg total) by mouth daily. 07/17/23   Palumbo, April, MD  metaxalone (SKELAXIN) 800 MG tablet Take 1 tablet (800 mg total) by mouth 3 (three) times daily. 07/17/23   Palumbo, April, MD  methylPREDNISolone (MEDROL DOSEPAK) 4 MG TBPK tablet See instructions 04/22/23   Alvira Monday, MD  metroNIDAZOLE (METROGEL) 0.75 % vaginal gel Place 1 Applicatorful vaginally at bedtime. 5 day course.    [provider]  oxyCODONE (ROXICODONE) 5 MG immediate release tablet Take 1 tablet (5 mg total) by mouth every 4 (four) hours as needed for severe pain. 04/22/23   Alvira Monday, MD      Allergies    Bactrim [sulfamethoxazole-trimethoprim], Penicillins, Reglan [metoclopramide], Tape, and Ritalin [methylphenidate]    Review of Systems   Review of Systems  Physical Exam Updated Vital Signs BP 115/82 (BP Location: Left Arm)   Pulse (!) 109   Temp 98.2 F (36.8 C)   Resp 18   Wt 117.9 kg   SpO2 98%   BMI 43.27 kg/m  Physical Exam Vitals and nursing note reviewed.  Constitutional:      General: She is not in acute distress.    Appearance: She is well-developed.  HENT:     Head: Normocephalic and atraumatic.     Right Ear: External ear normal.     Left Ear: External ear normal.     Nose: Nose normal.  Eyes:     Conjunctiva/sclera: Conjunctivae normal.  Cardiovascular:     Rate and Rhythm: Normal rate and regular rhythm.      Heart sounds: No murmur heard. Pulmonary:     Effort: No respiratory distress.     Breath sounds: No wheezing, rhonchi or rales.  Abdominal:     Palpations: Abdomen is soft.     Tenderness: There is no abdominal tenderness. There is no guarding or rebound.  Musculoskeletal:     Cervical back: Normal range of motion and neck supple.     Right lower leg: No edema.     Left lower leg: No edema.  Skin:    General: Skin is warm and dry.     Findings: No rash.     Comments: Minimal erythema noted without active drainage in the fold beneath the right  breast.  Patient has some hyperpigmented patches on the bilateral mid abdomen bilaterally without any fluctuance or induration.  Patient has scattered pustules noted on the mid back bilaterally, definitely crossing the midline, but favoring the right side consistent with folliculitis.  Neurological:     General: No focal deficit present.     Mental Status: She is alert. Mental status is at baseline.     Motor: No weakness.  Psychiatric:        Mood and Affect: Mood normal.     ED Results / Procedures / Treatments   Labs (all labs ordered are listed, but only abnormal results are displayed) Labs Reviewed - No data to display  EKG None  Radiology No results found.  Procedures Procedures    Medications Ordered in ED Medications - No data to display  ED Course/ Medical Decision Making/ A&P    Patient seen and examined. History obtained directly from patient.   Labs/EKG: None ordered.  Patient did show me her current blood sugar from her monitor which was 96.  Imaging: None ordered  Medications/Fluids: None ordered  Most recent vital signs reviewed and are as follows: BP 115/82 (BP Location: Left Arm)   Pulse (!) 109   Temp 98.2 F (36.8 C)   Resp 18   Wt 117.9 kg   SpO2 98%   BMI 43.27 kg/m   Initial impression: Patient's main problem currently is folliculitis with scattered pustules on her back.  This is bilateral,  not unilateral and therefore I have low concern for shingles.  Low concern for disseminated herpes.  No fevers.  Patient well nontoxic.  She has been taking moxifloxacin and will temporarily change to Doxycycline for better staph coverage.  Will also give trial of clotrimazole ointment for intertrigo.  Return instructions discussed with patient: Return with worsening symptoms, uncontrolled pain, fevers  Follow-up instructions discussed with patient: PCP in 1 week to assess effectiveness of treatment                                Medical Decision Making Risk Prescription drug management.   Patient with several skin complaints as discussed above.  No abscess or severe cellulitis.  Considered shingles versus folliculitis, however symptoms are not unilateral.  Blood sugars are controlled.  Considered more severe infection due to immunocompromising medications and diabetes, however I do not suspect disseminated infections at this time.  Patient looks well, nontoxic.        Final Clinical Impression(s) / ED Diagnoses Final diagnoses:  Folliculitis  Intertrigo    Rx / DC Orders ED Discharge Orders          Ordered    doxycycline (VIBRAMYCIN) 100 MG capsule  2 times daily        11/06/23 1750    clotrimazole (LOTRIMIN) 1 % cream        11/06/23 1751              Renne Crigler, PA-C 11/06/23 1802    Tegeler, Canary Brim, MD 11/06/23 [phone]

## 2023-11-06 NOTE — Discharge Instructions (Addendum)
Please read and follow all provided instructions.  Your diagnoses today include:  1. Folliculitis   2. Intertrigo     Tests performed today include: Vital signs. See below for your results today.   Medications prescribed:  Doxycycline - antibiotic  You have been prescribed an antibiotic medicine: take the entire course of medicine even if you are feeling better. Stopping early can cause the antibiotic not to work.  Please hold and do not take your moxifloxacin while you are taking the doxycycline, because the doxycycline will likely have the same effect.  After you are done the doxycycline, in the future use the moxifloxacin for flares of the hidradenitis suppurativa.  Clotrimazole ointment - you may try to use this ointment underneath your breasts to treat possible yeast infection which is causing pain  Take any prescribed medications only as directed.   Home care instructions:  Follow any educational materials contained in this packet. Keep affected area above the level of your heart when possible. Wash area gently twice a day with warm soapy water. Do not apply alcohol or hydrogen peroxide. Cover the area if it draining or weeping.   Follow-up instructions: Please follow-up with your primary care provider in the next 1 week for further evaluation of your symptoms.   Return instructions:  Return to the Emergency Department if you have: Fever Worsening symptoms Worsening pain Worsening swelling Redness of the skin that moves away from the affected area, especially if it streaks away from the affected area  Any other emergent concerns  Your vital signs today were: BP 115/82 (BP Location: Left Arm)   Pulse (!) 109   Temp 98.2 F (36.8 C)   Resp 18   Wt 117.9 kg   SpO2 98%   BMI 43.27 kg/m  If your blood pressure (BP) was elevated above 135/85 this visit, please have this repeated by your doctor within one month. --------------

## 2023-11-06 NOTE — ED Triage Notes (Addendum)
Pt reports painful pus filled bumps on mid back began on Saturday Pt has HS under breast which are draining. Currently on Moxiflocin for HS

## 2024-05-04 ENCOUNTER — Emergency Department (HOSPITAL_BASED_OUTPATIENT_CLINIC_OR_DEPARTMENT_OTHER)
Admission: EM | Admit: 2024-05-04 | Discharge: 2024-05-04 | Disposition: A | Discharge status: Home / Self Care | Attending: Emergency Medicine | Admitting: Emergency Medicine

## 2024-05-04 ENCOUNTER — Encounter (HOSPITAL_BASED_OUTPATIENT_CLINIC_OR_DEPARTMENT_OTHER): Payer: Self-pay | Admitting: Urology

## 2024-05-04 ENCOUNTER — Other Ambulatory Visit: Payer: Self-pay

## 2024-05-04 DIAGNOSIS — L0201 Cutaneous abscess of face: Secondary | ICD-10-CM | POA: Insufficient documentation

## 2024-05-04 DIAGNOSIS — L0291 Cutaneous abscess, unspecified: Secondary | ICD-10-CM

## 2024-05-04 DIAGNOSIS — Z794 Long term (current) use of insulin: Secondary | ICD-10-CM | POA: Insufficient documentation

## 2024-05-04 MED ORDER — CLINDAMYCIN HCL 150 MG PO CAPS: 450.0000 mg | ORAL_CAPSULE | Freq: Three times a day (TID) | ORAL | 0 refills | Status: AC

## 2024-05-04 MED ORDER — FLUCONAZOLE 150 MG PO TABS: 150.0000 mg | ORAL_TABLET | Freq: Every day | ORAL | 0 refills | Status: DC

## 2024-05-04 NOTE — ED Triage Notes (Signed)
 Pt states possible abscess to forehead that was noticed 4 days ago Started as pimple and since has gotten hard and more painful  Also states pain in neck and shoulders  States had sx last year on cervical spine and has been hurting since

## 2024-05-04 NOTE — ED Provider Notes (Signed)
 [place] EMERGENCY DEPARTMENT AT [facility] [place] Provider Note   CSN: [id number] Arrival date & time: 05/04/24  1803     History  Chief Complaint  Patient presents with   Abscess   Torticollis    Glenda Mann is a 38 y.o. female.   Abscess  Patient is a 38 year old female visited today with complaints of facial abscess.  Noted to have an extensive history of abscesses and is currently scheduled undergo surgery according to her for hidradenitis suppurativa.  Was seen previously on 5/13/125 and given clindamycin .  Came in today with complaints of right sided paracervical spinal muscle tenderness, notably going to PT for this currently.  Also concern for abscesses to forehead, noting that she has been putting warm compresses and that 1 is coming to ahead already.  Denies fever, vision changes, chest pain, shortness of breath, increased pain, increased swelling.     Home Medications Prior to Admission medications   Medication Sig Start Date End Date Taking? Authorizing Provider  clindamycin  (CLEOCIN ) 150 MG capsule Take 3 capsules (450 mg total) by mouth 3 (three) times daily for 7 days. 05/04/24 05/11/24 Yes Ivon Roedel S, PA-C  acetaminophen  (TYLENOL ) 500 MG tablet Take 1 tablet (500 mg total) by mouth every 6 (six) hours as needed. Patient not taking: Reported on 03/13/2023 01/21/23   Redwine, Madison A, PA-C  Adalimumab (HUMIRA, 2 PEN,) 40 MG/0.4ML PNKT Inject 40 mg into the skin every Monday.    [provider]  albuterol  (VENTOLIN  HFA) 108 (90 Base) MCG/ACT inhaler Inhale 1-2 puffs into the lungs every 6 (six) hours as needed for wheezing or shortness of breath. Patient taking differently: Inhale 2 puffs into the lungs every 6 (six) hours as needed for wheezing or shortness of breath. 02/01/23   [place] Butter, PA  amLODipine  (NORVASC ) 5 MG tablet Take 1 tablet (5 mg total) by mouth daily. 03/15/23 04/14/23  Leona Rake, MD  Blood Pressure KIT 1 each by Does  not apply route 2 (two) times daily. 03/15/23   Adhikari, Amrit, MD  budesonide-formoterol (SYMBICORT) 160-4.5 MCG/ACT inhaler Inhale 2 puffs into the lungs daily as needed (wheezing, shortness of breath).    [provider]  clotrimazole  (LOTRIMIN ) 1 % cream Apply to under folds of breasts 2 times daily 11/06/23   Lyna Sandhoff, PA-C  cyclobenzaprine  (FLEXERIL ) 10 MG tablet Take 1 tablet (10 mg total) by mouth 2 (two) times daily as needed for muscle spasms. 04/22/23   Scarlette Currier, MD  doxycycline  (VIBRAMYCIN ) 100 MG capsule Take 1 capsule (100 mg total) by mouth 2 (two) times daily. 11/06/23   Geiple, Joshua, PA-C  ergocalciferol (VITAMIN D2) 1.25 MG (50000 UT) capsule Take 50,000 Units by mouth once a week.    [provider]  fluconazole  (DIFLUCAN ) 150 MG tablet Take 1 tablet (150 mg total) by mouth daily. 05/04/24   Hayes Lipps, PA-C  gabapentin  (NEURONTIN ) 300 MG capsule Take 1 capsule (300 mg total) by mouth 3 (three) times daily. 04/22/23 05/22/23  Scarlette Currier, MD  glipiZIDE (GLUCOTROL) 10 MG tablet Take 10 mg by mouth 2 (two) times daily.    [provider]  ibuprofen  (ADVIL ) 800 MG tablet Take 800 mg by mouth daily as needed for moderate pain.    [provider]  insulin  lispro (HUMALOG) 100 UNIT/ML injection Inject 200 Units into the skin every 3 (three) days. Load 200 units into pump every 3 days.    [provider]  levonorgestrel (MIRENA)  20 MCG/24HR IUD 1 each by Intrauterine route once.    [provider]  lidocaine  (LIDODERM ) 5 % Place 2 patches onto the skin daily. Remove & Discard patch within 12 hours or as directed by MD 03/15/23   Leona Rake, MD  lidocaine  (LIDODERM ) 5 % Place 1 patch onto the skin daily. Remove & Discard patch within 12 hours or as directed by MD 07/17/23   Maralee Senate, April, MD  meloxicam  (MOBIC ) 15 MG tablet Take 1 tablet (15 mg total) by mouth daily. 07/17/23   Palumbo, April, MD  metaxalone   (SKELAXIN ) 800 MG tablet Take 1 tablet (800 mg total) by mouth 3 (three) times daily. 07/17/23   Palumbo, April, MD  metFORMIN (GLUCOPHAGE-XR) 500 MG 24 hr tablet Take 2,000 mg by mouth daily with breakfast.    [provider]  methylPREDNISolone  (MEDROL  DOSEPAK) 4 MG TBPK tablet See instructions 04/22/23   Scarlette Currier, MD  metroNIDAZOLE  (METROGEL ) 0.75 % vaginal gel Place 1 Applicatorful vaginally at bedtime. 5 day course.    [provider]  moxifloxacin (AVELOX) 400 MG tablet Take 400 mg by mouth daily at 8 pm.    [provider]  oxyCODONE  (ROXICODONE ) 5 MG immediate release tablet Take 1 tablet (5 mg total) by mouth every 4 (four) hours as needed for severe pain. 04/22/23   Scarlette Currier, MD  spironolactone (ALDACTONE) 25 MG tablet Take 25 mg by mouth daily.    [provider]  tirzepatide Florence Hunt) 7.5 MG/0.5ML Pen Inject 7.5 mg into the skin every Monday.    [provider]      Allergies    Bactrim  [sulfamethoxazole -trimethoprim ], Penicillins, Reglan  [metoclopramide ], Tape, and Ritalin [methylphenidate]    Review of Systems   Review of Systems  Skin:        Abscess  All other systems reviewed and are negative.   Physical Exam Updated Vital Signs BP (!) 143/69 (BP Location: Right Arm)   Pulse (!) 108   Temp 98.6 F (37 C) (Oral)   Resp 18   Ht 5\' 5"  (1.651 m)   Wt 117.9 kg   SpO2 98%   BMI 43.25 kg/m  Physical Exam Vitals and nursing note reviewed.  Constitutional:      General: She is not in acute distress.    Appearance: Normal appearance. She is not ill-appearing or diaphoretic.  HENT:     Head: Normocephalic and atraumatic.     Comments: Abscess noted to forehead Eyes:     General: No scleral icterus.       Right eye: No discharge.        Left eye: No discharge.     Extraocular Movements: Extraocular movements intact.     Conjunctiva/sclera: Conjunctivae normal.     Pupils: Pupils are equal, round, and  reactive to light.  Cardiovascular:     Rate and Rhythm: Normal rate and regular rhythm.     Pulses: Normal pulses.     Heart sounds: Normal heart sounds. No murmur heard.    No friction rub. No gallop.  Pulmonary:     Effort: Pulmonary effort is normal. No respiratory distress.     Breath sounds: Normal breath sounds. No stridor. No wheezing, rhonchi or rales.  Abdominal:     General: Abdomen is flat. There is no distension.     Palpations: Abdomen is soft.     Tenderness: There is no abdominal tenderness. There is no right CVA tenderness, left CVA tenderness or guarding.  Musculoskeletal:  General: No deformity.     Cervical back: Normal range of motion. No rigidity or tenderness.     Right lower leg: No edema.     Left lower leg: No edema.  Skin:    General: Skin is warm and dry.     Findings: Erythema present. No bruising.     Comments: 1 abscess noted to the forehead, has come to ahead.  No drainage noted.  Abscesses noted to both axilla.  Neurological:     General: No focal deficit present.     Mental Status: She is alert and oriented to person, place, and time. Mental status is at baseline.     Sensory: No sensory deficit.     Motor: No weakness.     Gait: Gait normal.  Psychiatric:        Mood and Affect: Mood normal.     ED Results / Procedures / Treatments   Labs (all labs ordered are listed, but only abnormal results are displayed) Labs Reviewed - No data to display  EKG None  Radiology No results found.  Procedures Procedures    Medications Ordered in ED Medications - No data to display  ED Course/ Medical Decision Making/ A&P                                 Medical Decision Making  This patient is a 38 year old female who presents to the ED for concern of abscess to face as well as right shoulder pain.  Already seen dermatology for this and is scheduled for surgery per her for the hidradenitis suppurativa.  Was given clindamycin  earlier in  May.  Notes that this previously is the best medication that works best for her.  Requesting it today.  Also noting some right-sided shoulder muscle tenderness for which she is already seeing PT for.  On physical exam, patient is in no acute distress, afebrile, alert and orient x 4, speaking in full sentences, nontachypneic, nontachycardic. 1 abscess noted to the forehead, noted to already have come to ahead at this time.  No drainage noted.  Abscess is also noted to both axilla, chronic.  Mild right-sided paraspinal muscle tenderness noted over the cervical paraspinal muscle.  Unremarkable exam otherwise.  Will have her manage paraspinal tenderness with PT as well as use with pain cream and over-the-counter topical medications.  For the abscess, will represcribe clindamycin  per her request as this works well for her.  Will also represcribe fluconazole  she says that she normally gets yeast infection with this medication.  Will have her follow-up with dermatology as well as PCP for any persistent symptoms and return to the ED for any new or worsening symptoms.  Patient vital signs have remained stable throughout the course of patient's time in the ED. Low suspicion for any other emergent pathology at this time. I believe this patient is safe to be discharged. Provided strict return to ER precautions. Patient expressed agreement and understanding of plan. All questions were answered.   Differential diagnoses prior to evaluation: The emergent differential diagnosis includes, but is not limited to,  abscess, cellulitis, follucullitis, dermatitis, necrotizing fasciitis. This is not an exhaustive differential.   Past Medical History / Co-morbidities / Social History: Hidradenitis axillaris, T2DM  Additional history: Chart reviewed. Pertinent results include:  Being seen in the outpatient setting with PT for chronic back pain, noted to have cervical radiculopathy as well as chronic back pain.  Seen by  dermatology on 04/08/2024.  Noted to have her continuing to use Cosentyx as well as reducing spironolactone daily.  Noted to have been told to apply triamcinolone to back.  As well as prescribe clindamycin  twice daily for 2 weeks.  Lab Tests/Imaging studies: No further testing or labs needed at this time.    Medications:   I have reviewed the patients home medicines and have made adjustments as needed.  Critical Interventions:  Social Determinants of Health:  Disposition: After consideration of the diagnostic results and the patients response to treatment, I feel that the patient would benefit from discharge and shortness above.   emergency department workup does not suggest an emergent condition requiring admission or immediate intervention beyond what has been performed at this time. The plan is: Follow-up with PCP/dermatology for any persistent symptoms, clindamycin  for abscess, fluconazole  for likely yeast infection, return for new or worsening symptoms. The patient is safe for discharge and has been instructed to return immediately for worsening symptoms, change in symptoms or any other concerns.   Final Clinical Impression(s) / ED Diagnoses Final diagnoses:  Abscess    Rx / DC Orders ED Discharge Orders          Ordered    fluconazole  (DIFLUCAN ) 150 MG tablet  Daily        05/04/24 1855    clindamycin  (CLEOCIN ) 150 MG capsule  3 times daily        05/04/24 1855              Emelly Wurtz S, PA-C 05/04/24 1908    Dalene Duck, MD 05/05/24 [phone]

## 2024-05-04 NOTE — ED Notes (Signed)

## 2024-05-04 NOTE — Discharge Instructions (Signed)
 You were seen today for abscess, I am prescribing you clindamycin  to use as well as recommend that you continue to use warm compresses for these abscesses.  Follow-up with dermatology if you continue to have persistent symptoms as well as follow-up with PCP for any persistent symptoms.  Recommend you use Voltaren cream as well as Salonpas pads over your right shoulder for the shoulder pain.  Please be sure to take a probiotic including something like Greek yogurt to help with antibiotic.  I am also sending in fluconazole  per your request due to risk of yeast infection.

## 2024-08-17 ENCOUNTER — Emergency Department (HOSPITAL_BASED_OUTPATIENT_CLINIC_OR_DEPARTMENT_OTHER)
Admission: EM | Admit: 2024-08-17 | Discharge: 2024-08-17 | Disposition: A | Discharge status: Home / Self Care | Attending: Emergency Medicine | Admitting: Emergency Medicine

## 2024-08-17 ENCOUNTER — Encounter (HOSPITAL_BASED_OUTPATIENT_CLINIC_OR_DEPARTMENT_OTHER): Payer: Self-pay | Admitting: Emergency Medicine

## 2024-08-17 DIAGNOSIS — N61 Mastitis without abscess: Secondary | ICD-10-CM | POA: Insufficient documentation

## 2024-08-17 DIAGNOSIS — E119 Type 2 diabetes mellitus without complications: Secondary | ICD-10-CM | POA: Insufficient documentation

## 2024-08-17 DIAGNOSIS — Z794 Long term (current) use of insulin: Secondary | ICD-10-CM | POA: Insufficient documentation

## 2024-08-17 DIAGNOSIS — Z7984 Long term (current) use of oral hypoglycemic drugs: Secondary | ICD-10-CM | POA: Insufficient documentation

## 2024-08-17 DIAGNOSIS — N644 Mastodynia: Secondary | ICD-10-CM | POA: Diagnosis present

## 2024-08-17 DIAGNOSIS — L732 Hidradenitis suppurativa: Secondary | ICD-10-CM | POA: Insufficient documentation

## 2024-08-17 MED ORDER — AMOXICILLIN-POT CLAVULANATE 875-125 MG PO TABS: 1.0000 | ORAL_TABLET | Freq: Two times a day (BID) | ORAL | 0 refills | Status: AC

## 2024-08-17 MED ORDER — FLUCONAZOLE 150 MG PO TABS: ORAL_TABLET | ORAL | 0 refills | Status: AC

## 2024-08-17 MED ORDER — OXYCODONE-ACETAMINOPHEN 5-325 MG PO TABS
2.0000 | ORAL_TABLET | Freq: Once | ORAL | Status: AC
  Administered 2024-08-17: 2 via ORAL
  Filled 2024-08-17: qty 2

## 2024-08-17 NOTE — ED Triage Notes (Signed)
 Pt c/o bil breast pain x 2 weeks; denies nipple discharge, but reports discharge from under breasts

## 2024-08-17 NOTE — Discharge Instructions (Addendum)
 You are seen in the emergency department today for concerns of breast pain.  You appear to have infection of the skin of both breasts.  With the drainage that is currently present, there is no need for incision or drainage at this time.  I will start her on a course of antibiotics he will take as prescribed twice daily for the next 7 days.  Please follow-up closely with your primary care provider, dermatology, and your specialist team who manage your hidradenitis suppurativa.  For any concerns or worsening symptoms, return to the emergency department.

## 2024-08-17 NOTE — ED Provider Notes (Signed)
 [place] [facility] Provider Note   CSN: [id number] Arrival date & time: 08/17/24  1552     Patient presents with: Breast Pain   Glenda Mann is a 38 y.o. female.  Patient with past history significant for hidradenitis suppurativa, multiple areas of abscess on the skin, recurrent cellulitis, type 2 diabetes presents emergency department concerns of back pain and drainage.  Reports has been having pain to bilateral breast about 2 weeks but no reported nipple discharge.  She does endorse some areas of purulent drainage underneath bilateral breast.  No fevers, chills, or flulike symptoms.   HPI     Prior to Admission medications   Medication Sig Start Date End Date Taking? Authorizing Provider  amoxicillin -clavulanate (AUGMENTIN ) 875-125 MG tablet Take 1 tablet by mouth every 12 (twelve) hours for 7 days. 08/17/24 08/24/24 Yes Shakora Nordquist A, PA-C  acetaminophen  (TYLENOL ) 500 MG tablet Take 1 tablet (500 mg total) by mouth every 6 (six) hours as needed. Patient not taking: Reported on 03/13/2023 01/21/23   Redwine, Madison A, PA-C  Adalimumab (HUMIRA, 2 PEN,) 40 MG/0.4ML PNKT Inject 40 mg into the skin every Monday.    [provider]  albuterol  (VENTOLIN  HFA) 108 (90 Base) MCG/ACT inhaler Inhale 1-2 puffs into the lungs every 6 (six) hours as needed for wheezing or shortness of breath. Patient taking differently: Inhale 2 puffs into the lungs every 6 (six) hours as needed for wheezing or shortness of breath. 02/01/23   Silver Fell A, PA  amLODipine  (NORVASC ) 5 MG tablet Take 1 tablet (5 mg total) by mouth daily. 03/15/23 04/14/23  Jillian Buttery, MD  Blood Pressure KIT 1 each by Does not apply route 2 (two) times daily. 03/15/23   Adhikari, Amrit, MD  budesonide-formoterol (SYMBICORT) 160-4.5 MCG/ACT inhaler Inhale 2 puffs into the lungs daily as needed (wheezing, shortness of breath).    [provider]  clotrimazole  (LOTRIMIN ) 1 %  cream Apply to under folds of breasts 2 times daily 11/06/23   Desiderio Chew, PA-C  cyclobenzaprine  (FLEXERIL ) 10 MG tablet Take 1 tablet (10 mg total) by mouth 2 (two) times daily as needed for muscle spasms. 04/22/23   Dreama Longs, MD  doxycycline  (VIBRAMYCIN ) 100 MG capsule Take 1 capsule (100 mg total) by mouth 2 (two) times daily. 11/06/23   Geiple, Joshua, PA-C  ergocalciferol (VITAMIN D2) 1.25 MG (50000 UT) capsule Take 50,000 Units by mouth once a week.    [provider]  fluconazole  (DIFLUCAN ) 150 MG tablet Take 1 tablet (150 mg total) by mouth daily. 05/04/24   Beola Terrall RAMAN, PA-C  gabapentin  (NEURONTIN ) 300 MG capsule Take 1 capsule (300 mg total) by mouth 3 (three) times daily. 04/22/23 05/22/23  Dreama Longs, MD  glipiZIDE (GLUCOTROL) 10 MG tablet Take 10 mg by mouth 2 (two) times daily.    [provider]  ibuprofen  (ADVIL ) 800 MG tablet Take 800 mg by mouth daily as needed for moderate pain.    [provider]  insulin  lispro (HUMALOG) 100 UNIT/ML injection Inject 200 Units into the skin every 3 (three) days. Load 200 units into pump every 3 days.    [provider]  levonorgestrel (MIRENA) 20 MCG/24HR IUD 1 each by Intrauterine route once.    [provider]  lidocaine  (LIDODERM ) 5 % Place 2 patches onto the skin daily. Remove & Discard patch within 12 hours or as directed by MD 03/15/23   Jillian Buttery, MD  lidocaine  (LIDODERM ) 5 %  Place 1 patch onto the skin daily. Remove & Discard patch within 12 hours or as directed by MD 07/17/23   Nettie, April, MD  meloxicam  (MOBIC ) 15 MG tablet Take 1 tablet (15 mg total) by mouth daily. 07/17/23   Palumbo, April, MD  metaxalone  (SKELAXIN ) 800 MG tablet Take 1 tablet (800 mg total) by mouth 3 (three) times daily. 07/17/23   Palumbo, April, MD  metFORMIN (GLUCOPHAGE-XR) 500 MG 24 hr tablet Take 2,000 mg by mouth daily with breakfast.    [provider]  methylPREDNISolone  (MEDROL   DOSEPAK) 4 MG TBPK tablet See instructions 04/22/23   Dreama Longs, MD  metroNIDAZOLE  (METROGEL ) 0.75 % vaginal gel Place 1 Applicatorful vaginally at bedtime. 5 day course.    [provider]  moxifloxacin (AVELOX) 400 MG tablet Take 400 mg by mouth daily at 8 pm.    [provider]  oxyCODONE  (ROXICODONE ) 5 MG immediate release tablet Take 1 tablet (5 mg total) by mouth every 4 (four) hours as needed for severe pain. 04/22/23   Dreama Longs, MD  spironolactone (ALDACTONE) 25 MG tablet Take 25 mg by mouth daily.    [provider]  tirzepatide CLOYDE) 7.5 MG/0.5ML Pen Inject 7.5 mg into the skin every Monday.    [provider]    Allergies: Bactrim  [sulfamethoxazole -trimethoprim ], Penicillins, Reglan  [metoclopramide ], Tape, and Ritalin [methylphenidate]    Review of Systems  Skin:  Positive for wound.  All other systems reviewed and are negative.   Updated Vital Signs BP (!) 141/98 (BP Location: Right Arm)   Pulse (!) 102   Temp 97.7 F (36.5 C)   Resp 16   Ht 5' 5 (1.651 m)   Wt 117.9 kg   SpO2 100%   BMI 43.25 kg/m   Physical Exam Vitals and nursing note reviewed. Exam conducted with a chaperone present.  Constitutional:      General: She is not in acute distress.    Appearance: She is well-developed.  HENT:     Head: Normocephalic and atraumatic.  Eyes:     Conjunctiva/sclera: Conjunctivae normal.  Cardiovascular:     Rate and Rhythm: Normal rate and regular rhythm.     Heart sounds: No murmur heard. Pulmonary:     Effort: Pulmonary effort is normal. No respiratory distress.     Breath sounds: Normal breath sounds.  Abdominal:     Palpations: Abdomen is soft.     Tenderness: There is no abdominal tenderness.  Musculoskeletal:        General: No swelling.     Cervical back: Neck supple.  Skin:    General: Skin is warm and dry.     Capillary Refill: Capillary refill takes less than 2 seconds.     Findings: Erythema  and lesion present.     Comments: Right axilla with areas of skin induration with white centers and slight drainage present.  Breast with areas of erythema, induration, and a small amount of purulence.   Neurological:     Mental Status: She is alert.  Psychiatric:        Mood and Affect: Mood normal.     (all labs ordered are listed, but only abnormal results are displayed) Labs Reviewed - No data to display  EKG: None  Radiology: No results found.   Procedures   Medications Ordered in the ED  oxyCODONE -acetaminophen  (PERCOCET/ROXICET) 5-325 MG per tablet 2 tablet (2 tablets Oral Given 08/17/24 1652)  Medical Decision Making  This patient presents to the ED for concern of breast pain.  Differential diagnosis includes mastitis, cellulitis, breast abscess, hidradenitis suppurativa   Medicines ordered and prescription drug management:  I ordered medication including Percocet for pain Reevaluation of the patient after these medicines showed that the patient improved I have reviewed the patients home medicines and have made adjustments as needed   Problem List / ED Course:  Patient presents to the emergency department today with concerns of breast pain.  Reports developing redness, swelling, and pain with some drainage present from bilateral breasts outside of the nipples for the last several days.  Denies current breast-feeding.  States that she has a diagnosis of hidradenitis suppurativa and is currently managed by [facility] dermatology.  Reports that she has previously had a breast abscess that required her to be hospitalized after becoming severely ill.  No reported fevers, weakness, dizziness, lethargy. Exam reveals areas of redness and induration to bilateral breast primarily towards the inferomedial aspects of bilateral breast.  Small areas of purulent drainage are seen.  No obvious area of fluctuance with only noted skin  induration. Exam is most consistent with likely cellulitis vs abscess. Without fluctuance, doubtful of considerably abscess. Patient without signs of systemic illness at this time. Will start on a course of antibiotics to treat suspected cellulitis. Strict return precautions discussed. Otherwise stable at this time for outpatient follow up and discharged home.   Social Determinants of Health:  None  Final diagnoses:  Cellulitis of breast  Hidradenitis suppurativa    ED Discharge Orders          Ordered    amoxicillin -clavulanate (AUGMENTIN ) 875-125 MG tablet  Every 12 hours        08/17/24 1654               Grahm Etsitty A, PA-C 08/17/24 1659    Zackowski, Scott, MD 08/18/24 [phone]

## 2024-08-30 ENCOUNTER — Encounter (HOSPITAL_BASED_OUTPATIENT_CLINIC_OR_DEPARTMENT_OTHER): Payer: Self-pay | Admitting: Emergency Medicine

## 2024-08-30 ENCOUNTER — Emergency Department (HOSPITAL_BASED_OUTPATIENT_CLINIC_OR_DEPARTMENT_OTHER)
Admission: EM | Admit: 2024-08-30 | Discharge: 2024-08-30 | Disposition: A | Discharge status: Home / Self Care | Attending: Emergency Medicine | Admitting: Emergency Medicine

## 2024-08-30 DIAGNOSIS — D72829 Elevated white blood cell count, unspecified: Secondary | ICD-10-CM | POA: Insufficient documentation

## 2024-08-30 DIAGNOSIS — Z794 Long term (current) use of insulin: Secondary | ICD-10-CM | POA: Insufficient documentation

## 2024-08-30 DIAGNOSIS — E1165 Type 2 diabetes mellitus with hyperglycemia: Secondary | ICD-10-CM | POA: Insufficient documentation

## 2024-08-30 DIAGNOSIS — Z7984 Long term (current) use of oral hypoglycemic drugs: Secondary | ICD-10-CM | POA: Insufficient documentation

## 2024-08-30 DIAGNOSIS — R739 Hyperglycemia, unspecified: Secondary | ICD-10-CM | POA: Diagnosis present

## 2024-08-30 DIAGNOSIS — M546 Pain in thoracic spine: Secondary | ICD-10-CM | POA: Diagnosis not present

## 2024-08-30 LAB — BETA-HYDROXYBUTYRIC ACID: Beta-Hydroxybutyric Acid: 0.1 mmol/L (ref 0.05–0.27)

## 2024-08-30 LAB — CBC WITH DIFFERENTIAL/PLATELET
Abs Immature Granulocytes: 0.03 K/uL (ref 0.00–0.07)
Basophils Absolute: 0.1 K/uL (ref 0.0–0.1)
Basophils Relative: 0 %
Eosinophils Absolute: 0.4 K/uL (ref 0.0–0.5)
Eosinophils Relative: 3 %
HCT: 37.2 % (ref 36.0–46.0)
Hemoglobin: 12.1 g/dL (ref 12.0–15.0)
Immature Granulocytes: 0 %
Lymphocytes Relative: 23 %
Lymphs Abs: 3.2 K/uL (ref 0.7–4.0)
MCH: 27.2 pg (ref 26.0–34.0)
MCHC: 32.5 g/dL (ref 30.0–36.0)
MCV: 83.6 fL (ref 80.0–100.0)
Monocytes Absolute: 0.6 K/uL (ref 0.1–1.0)
Monocytes Relative: 4 %
Neutro Abs: 9.7 K/uL — ABNORMAL HIGH (ref 1.7–7.7)
Neutrophils Relative %: 70 %
Platelets: 330 K/uL (ref 150–400)
RBC: 4.45 MIL/uL (ref 3.87–5.11)
RDW: 13.2 % (ref 11.5–15.5)
WBC: 13.9 K/uL — ABNORMAL HIGH (ref 4.0–10.5)
nRBC: 0 % (ref 0.0–0.2)

## 2024-08-30 LAB — URINALYSIS, MICROSCOPIC (REFLEX)

## 2024-08-30 LAB — RESP PANEL BY RT-PCR (RSV, FLU A&B, COVID)  RVPGX2
Influenza A by PCR: NEGATIVE
Influenza B by PCR: NEGATIVE
Resp Syncytial Virus by PCR: NEGATIVE
SARS Coronavirus 2 by RT PCR: NEGATIVE

## 2024-08-30 LAB — URINALYSIS, ROUTINE W REFLEX MICROSCOPIC
Bilirubin Urine: NEGATIVE
Glucose, UA: >=500 mg/dL — AB
Hgb urine dipstick: NEGATIVE
Ketones, ur: NEGATIVE mg/dL
Nitrite: NEGATIVE
Protein, ur: NEGATIVE mg/dL
Specific Gravity, Urine: 1.025 (ref 1.005–1.030)
pH: 6.5 (ref 5.0–8.0)

## 2024-08-30 LAB — COMPREHENSIVE METABOLIC PANEL WITH GFR
ALT: 27 U/L (ref 0–44)
AST: 27 U/L (ref 15–41)
Albumin: 4.1 g/dL (ref 3.5–5.0)
Alkaline Phosphatase: 68 U/L (ref 38–126)
Anion gap: 13 (ref 5–15)
BUN: 10 mg/dL (ref 6–20)
CO2: 23 mmol/L (ref 22–32)
Calcium: 9 mg/dL (ref 8.9–10.3)
Chloride: 99 mmol/L (ref 98–111)
Creatinine, Ser: 0.54 mg/dL (ref 0.44–1.00)
GFR, Estimated: >60 mL/min (ref >60)
Glucose, Bld: 295 mg/dL — ABNORMAL HIGH (ref 70–99)
Potassium: 4.4 mmol/L (ref 3.5–5.1)
Sodium: 135 mmol/L (ref 135–145)
Total Bilirubin: 0.4 mg/dL (ref 0.0–1.2)
Total Protein: 7.4 g/dL (ref 6.5–8.1)

## 2024-08-30 LAB — CBG MONITORING, ED: Glucose-Capillary: 330 mg/dL — ABNORMAL HIGH (ref 70–99)

## 2024-08-30 MED ORDER — KETOROLAC TROMETHAMINE 15 MG/ML IJ SOLN
15.0000 mg | Freq: Once | INTRAMUSCULAR | Status: AC
  Administered 2024-08-30: 15 mg via INTRAVENOUS
  Filled 2024-08-30: qty 1

## 2024-08-30 MED ORDER — SODIUM CHLORIDE 0.9 % IV BOLUS
1000.0000 mL | Freq: Once | INTRAVENOUS | Status: AC
  Administered 2024-08-30: 1000 mL via INTRAVENOUS

## 2024-08-30 NOTE — Discharge Instructions (Addendum)
 You were evaluated in the emergency room for elevated blood sugar levels as well as back pain.  Your lab work did not show any significant abnormality.  Please follow-up with your primary care doctor for closer management of your sugar levels.  Please follow-up with your surgeon if your back pain can persist.  If you experience any new or worsening symptoms please return emergency room.

## 2024-08-30 NOTE — ED Triage Notes (Signed)
 Pt reports hyperglycemia (390s at home) x 3d; sts she just feels awful; also reports neck and back pain (no injury, chronic, had surg last year for same)

## 2024-08-30 NOTE — ED Provider Notes (Signed)
 [place] [facility] Provider Note   CSN: [id number] Arrival date & time: 08/30/24  1408     Patient presents with: Hyperglycemia   Glenda Mann is a 38 y.o. female with history of type 2 diabetes on insulin  presents with complaints of hyperglycemia.  Reports that her sugars have been nearly 400.  She endorses nausea without vomiting or diarrhea.  She additionally complains of ongoing back pain localized to her thoracic and lumbar region.  Denies any injury or trauma.  Has had some intermittent radicular symptoms.  Reports history of ACDF.  Denies any urinary symptoms.  Reports that she recently started with pain management and is taking Percocets.  Pain is worse with movements.    Hyperglycemia     Past Medical History:  Diagnosis Date   BV (bacterial vaginosis)    Chlamydia    DM II (diabetes mellitus, type II), controlled (HCC)    Gallstones    Gestational diabetes    Hidradenitis suppurativa    HSV (herpes simplex virus) infection    Shingles    Sleep apnea    Past Surgical History:  Procedure Laterality Date   CHOLECYSTECTOMY     INCISE AND DRAIN ABCESS       Prior to Admission medications   Medication Sig Start Date End Date Taking? Authorizing Provider  acetaminophen  (TYLENOL ) 500 MG tablet Take 1 tablet (500 mg total) by mouth every 6 (six) hours as needed. Patient not taking: Reported on 03/13/2023 01/21/23   Redwine, Madison A, PA-C  Adalimumab (HUMIRA, 2 PEN,) 40 MG/0.4ML PNKT Inject 40 mg into the skin every Monday.    [provider]  albuterol  (VENTOLIN  HFA) 108 (90 Base) MCG/ACT inhaler Inhale 1-2 puffs into the lungs every 6 (six) hours as needed for wheezing or shortness of breath. Patient taking differently: Inhale 2 puffs into the lungs every 6 (six) hours as needed for wheezing or shortness of breath. 02/01/23   Silver Wonda LABOR, PA  amLODipine  (NORVASC ) 5 MG tablet Take 1 tablet (5 mg total) by mouth daily.  03/15/23 04/14/23  Jillian Buttery, MD  Blood Pressure KIT 1 each by Does not apply route 2 (two) times daily. 03/15/23   Adhikari, Amrit, MD  budesonide-formoterol (SYMBICORT) 160-4.5 MCG/ACT inhaler Inhale 2 puffs into the lungs daily as needed (wheezing, shortness of breath).    [provider]  clotrimazole  (LOTRIMIN ) 1 % cream Apply to under folds of breasts 2 times daily 11/06/23   Desiderio Chew, PA-C  cyclobenzaprine  (FLEXERIL ) 10 MG tablet Take 1 tablet (10 mg total) by mouth 2 (two) times daily as needed for muscle spasms. 04/22/23   Dreama Longs, MD  doxycycline  (VIBRAMYCIN ) 100 MG capsule Take 1 capsule (100 mg total) by mouth 2 (two) times daily. 11/06/23   Geiple, Joshua, PA-C  ergocalciferol (VITAMIN D2) 1.25 MG (50000 UT) capsule Take 50,000 Units by mouth once a week.    [provider]  fluconazole  (DIFLUCAN ) 150 MG tablet Take if you begin to experience symptoms of a yeast infection from your antibiotic. Repeat dose in 72 hours if symptoms worsen or do not resolve after initial dose. 08/17/24   Zelaya, Oscar A, PA-C  gabapentin  (NEURONTIN ) 300 MG capsule Take 1 capsule (300 mg total) by mouth 3 (three) times daily. 04/22/23 05/22/23  Dreama Longs, MD  glipiZIDE (GLUCOTROL) 10 MG tablet Take 10 mg by mouth 2 (two) times daily.    [provider]  ibuprofen  (ADVIL ) 800 MG tablet  Take 800 mg by mouth daily as needed for moderate pain.    [provider]  insulin  lispro (HUMALOG) 100 UNIT/ML injection Inject 200 Units into the skin every 3 (three) days. Load 200 units into pump every 3 days.    [provider]  levonorgestrel (MIRENA) 20 MCG/24HR IUD 1 each by Intrauterine route once.    [provider]  lidocaine  (LIDODERM ) 5 % Place 2 patches onto the skin daily. Remove & Discard patch within 12 hours or as directed by MD 03/15/23   Jillian Buttery, MD  lidocaine  (LIDODERM ) 5 % Place 1 patch onto the skin daily. Remove & Discard  patch within 12 hours or as directed by MD 07/17/23   Nettie, April, MD  meloxicam  (MOBIC ) 15 MG tablet Take 1 tablet (15 mg total) by mouth daily. 07/17/23   Palumbo, April, MD  metaxalone  (SKELAXIN ) 800 MG tablet Take 1 tablet (800 mg total) by mouth 3 (three) times daily. 07/17/23   Palumbo, April, MD  metFORMIN (GLUCOPHAGE-XR) 500 MG 24 hr tablet Take 2,000 mg by mouth daily with breakfast.    [provider]  methylPREDNISolone  (MEDROL  DOSEPAK) 4 MG TBPK tablet See instructions 04/22/23   Dreama Longs, MD  metroNIDAZOLE  (METROGEL ) 0.75 % vaginal gel Place 1 Applicatorful vaginally at bedtime. 5 day course.    [provider]  moxifloxacin (AVELOX) 400 MG tablet Take 400 mg by mouth daily at 8 pm.    [provider]  oxyCODONE  (ROXICODONE ) 5 MG immediate release tablet Take 1 tablet (5 mg total) by mouth every 4 (four) hours as needed for severe pain. 04/22/23   Dreama Longs, MD  spironolactone (ALDACTONE) 25 MG tablet Take 25 mg by mouth daily.    [provider]  tirzepatide CLOYDE) 7.5 MG/0.5ML Pen Inject 7.5 mg into the skin every Monday.    [provider]    Allergies: Bactrim  [sulfamethoxazole -trimethoprim ], Penicillins, Reglan  [metoclopramide ], Tape, and Ritalin [methylphenidate]    Review of Systems  Musculoskeletal:  Positive for myalgias.    Updated Vital Signs BP (!) 139/98   Pulse (!) 102   Temp 98.8 F (37.1 C) (Oral)   Resp 18   Ht 5' 5 (1.651 m)   Wt 117.9 kg   SpO2 97%   BMI 43.25 kg/m   Physical Exam Vitals and nursing note reviewed.  Constitutional:      General: She is not in acute distress.    Appearance: She is well-developed.  HENT:     Head: Normocephalic and atraumatic.  Eyes:     Conjunctiva/sclera: Conjunctivae normal.  Cardiovascular:     Rate and Rhythm: Normal rate and regular rhythm.     Heart sounds: No murmur heard. Pulmonary:     Effort: Pulmonary effort is normal. No respiratory  distress.     Breath sounds: Normal breath sounds.  Abdominal:     Palpations: Abdomen is soft.     Tenderness: There is no abdominal tenderness.  Musculoskeletal:     Cervical back: Neck supple.     Comments: Mild tenderness to left thoracic paraspinal region, 5 out of 5 upper and lower extremity strength, no sensation deficits, is ambulatory  Skin:    General: Skin is warm and dry.     Capillary Refill: Capillary refill takes less than 2 seconds.  Neurological:     Mental Status: She is alert.  Psychiatric:        Mood and Affect: Mood normal.     (all labs  ordered are listed, but only abnormal results are displayed) Labs Reviewed  CBC WITH DIFFERENTIAL/PLATELET - Abnormal; Notable for the following components:      Result Value   WBC 13.9 (*)    Neutro Abs 9.7 (*)    All other components within normal limits  COMPREHENSIVE METABOLIC PANEL WITH GFR - Abnormal; Notable for the following components:   Glucose, Bld 295 (*)    All other components within normal limits  URINALYSIS, ROUTINE W REFLEX MICROSCOPIC - Abnormal; Notable for the following components:   APPearance HAZY (*)    Glucose, UA >=500 (*)    Leukocytes,Ua TRACE (*)    All other components within normal limits  URINALYSIS, MICROSCOPIC (REFLEX) - Abnormal; Notable for the following components:   Bacteria, UA MANY (*)    All other components within normal limits  CBG MONITORING, ED - Abnormal; Notable for the following components:   Glucose-Capillary 330 (*)    All other components within normal limits  RESP PANEL BY RT-PCR (RSV, FLU A&B, COVID)  [id number]  URINE CULTURE  BETA-HYDROXYBUTYRIC ACID    EKG: None  Radiology: No results found.   Procedures   Medications Ordered in the ED  ketorolac  (TORADOL ) 15 MG/ML injection 15 mg (15 mg Intravenous Given 08/30/24 1642)  sodium chloride  0.9 % bolus 1,000 mL (1,000 mLs Intravenous New Bag/Given 08/30/24 1644)    Clinical Course as of 08/30/24 1828  Sat Aug 30, 2024  1628 Patient with history of insulin -dependent diabetes presents with complaints of hypoglycemia and acute on chronic back pain.  Upon arrival she is mildly tachycardic, otherwise hemodynamically stable.  On exam she has mild tenderness to her left paraspinal thoracic region.  Has no neurodeficits on exam.  No red flag symptoms.  [JT]  1711 Urinalysis, Routine w reflex microscopic -Urine, Clean Catch(!) Greater than 500 glucose, negative ketones, negative nitrites, trace leukocytes with many bacteria, however 0-5 WBCs.  She has no urinary symptoms.  Will send in culture. [JT]  1712 CBC with Differential(!) Mild leukocytosis at 13.9. [JT]  1748 Resp panel by RT-PCR (RSV, Flu A&B, Covid) Urine, Clean Catch Negative [JT]  1748 Comprehensive metabolic panel(!) CMP with glucose of 295, no anion gap [JT]  1826 Workup overall reassuring.  Patient reevaluated reports marked improvement of her symptoms.  Overall patient is amenable to discharge.  Patient has ample pain control medications at home.  Will follow-up on any additional prescriptions.  Encouraged to follow-up with surgeon and strict her symptoms persist as well as her PCP regarding her sugars. [JT]    Clinical Course User Index [JT] Donnajean Lynwood DEL, PA-C                                 Medical Decision Making Amount and/or Complexity of Data Reviewed Labs: ordered. Decision-making details documented in ED Course.  Risk Prescription drug management.   This patient presents to the ED with chief complaint(s) of hyperglycemia and back pain.  The complaint involves an extensive differential diagnosis and also carries with it a high risk of complications and morbidity.   Pertinent past medical history as listed in HPI  The differential diagnosis includes  Workup not consistent with DKA or HHS.  Exam and history are not consistent with spinal abscess or cauda equina.  No injury to be concerned about fracture.  Additional history  obtained: Records reviewed Care Everywhere/External Records  Disposition:   Patient  will be discharged home. The patient has been appropriately medically screened and/or stabilized in the ED. I have low suspicion for any other emergent medical condition which would require further screening, evaluation or treatment in the ED or require inpatient management. At time of discharge the patient is hemodynamically stable and in no acute distress. I have discussed work-up results and diagnosis with patient and answered all questions. Patient is agreeable with discharge plan. We discussed strict return precautions for returning to the emergency department and they verbalized understanding.     Social Determinants of Health:   none  This note was dictated with voice recognition software.  Despite best efforts at proofreading, errors may have occurred which can change the documentation meaning.       Final diagnoses:  Hyperglycemia  Acute left-sided thoracic back pain    ED Discharge Orders     None          Donnajean Lynwood DEL, PA-C 08/30/24 1828    Glenda Jayson LABOR, DO 09/04/24 2352

## 2024-09-01 LAB — URINE CULTURE: Culture: >=100000 — AB

## 2024-09-02 ENCOUNTER — Telehealth (HOSPITAL_BASED_OUTPATIENT_CLINIC_OR_DEPARTMENT_OTHER): Payer: Self-pay

## 2024-09-02 NOTE — Progress Notes (Signed)
 ED Antimicrobial Stewardship Positive Culture Follow Up   Glenda Mann is an 38 y.o. female who presented to [facility] on 08/30/2024 with a chief complaint of  Chief Complaint  Patient presents with   Hyperglycemia    Recent Results (from the past 720 hours)  Urine Culture     Status: Abnormal   Collection Time: 08/30/24  4:30 PM   Specimen: Urine, Clean Catch  Result Value Ref Range Status   Specimen Description   Final    URINE, CLEAN CATCH Performed at [facility], [address] [place], [place] [address]    Special Requests   Final    NONE Performed at [facility], [address] [place], [place] [address]    Culture (A)  Final    >=100,000 COLONIES/mL KLEBSIELLA PNEUMONIAE 50,000 COLONIES/mL DIPHTHEROIDS(CORYNEBACTERIUM SPECIES) Standardized susceptibility testing for this organism is not available. Performed at [facility], [address] [place], [place] [address]    Report Status 09/01/2024 FINAL  Final   Organism ID, Bacteria KLEBSIELLA PNEUMONIAE (A)  Final      Susceptibility   Klebsiella pneumoniae - MIC*    AMPICILLIN >=32 RESISTANT Resistant     CEFAZOLIN (URINE) Value in next row Sensitive      2 SENSITIVEThis is a modified FDA-approved test that has been validated and its performance characteristics determined by the reporting laboratory.  This laboratory is certified under the Clinical Laboratory Improvement Amendments CLIA as qualified to perform high complexity clinical laboratory testing.    CEFEPIME Value in next row Sensitive      2 SENSITIVEThis is a modified FDA-approved test that has been validated and its performance characteristics determined by the reporting laboratory.  This laboratory is certified under the Clinical Laboratory Improvement Amendments CLIA as qualified to perform high complexity clinical laboratory testing.    ERTAPENEM Value in next row Sensitive      2 SENSITIVEThis is a modified FDA-approved  test that has been validated and its performance characteristics determined by the reporting laboratory.  This laboratory is certified under the Clinical Laboratory Improvement Amendments CLIA as qualified to perform high complexity clinical laboratory testing.    CEFTRIAXONE  Value in next row Sensitive      2 SENSITIVEThis is a modified FDA-approved test that has been validated and its performance characteristics determined by the reporting laboratory.  This laboratory is certified under the Clinical Laboratory Improvement Amendments CLIA as qualified to perform high complexity clinical laboratory testing.    CIPROFLOXACIN Value in next row Sensitive      2 SENSITIVEThis is a modified FDA-approved test that has been validated and its performance characteristics determined by the reporting laboratory.  This laboratory is certified under the Clinical Laboratory Improvement Amendments CLIA as qualified to perform high complexity clinical laboratory testing.    GENTAMICIN Value in next row Sensitive      2 SENSITIVEThis is a modified FDA-approved test that has been validated and its performance characteristics determined by the reporting laboratory.  This laboratory is certified under the Clinical Laboratory Improvement Amendments CLIA as qualified to perform high complexity clinical laboratory testing.    NITROFURANTOIN  Value in next row Intermediate      2 SENSITIVEThis is a modified FDA-approved test that has been validated and its performance characteristics determined by the reporting laboratory.  This laboratory is certified under the Clinical Laboratory Improvement Amendments CLIA as qualified to perform high complexity clinical laboratory testing.    TRIMETH /SULFA   Value in next row Sensitive      2 SENSITIVEThis is a modified FDA-approved test that has been validated and its performance characteristics determined by the reporting laboratory.  This laboratory is certified under the Clinical Laboratory  Improvement Amendments CLIA as qualified to perform high complexity clinical laboratory testing.    AMPICILLIN/SULBACTAM Value in next row Sensitive      2 SENSITIVEThis is a modified FDA-approved test that has been validated and its performance characteristics determined by the reporting laboratory.  This laboratory is certified under the Clinical Laboratory Improvement Amendments CLIA as qualified to perform high complexity clinical laboratory testing.    PIP/TAZO Value in next row Sensitive      <=4 SENSITIVEThis is a modified FDA-approved test that has been validated and its performance characteristics determined by the reporting laboratory.  This laboratory is certified under the Clinical Laboratory Improvement Amendments CLIA as qualified to perform high complexity clinical laboratory testing.    MEROPENEM Value in next row Sensitive      <=4 SENSITIVEThis is a modified FDA-approved test that has been validated and its performance characteristics determined by the reporting laboratory.  This laboratory is certified under the Clinical Laboratory Improvement Amendments CLIA as qualified to perform high complexity clinical laboratory testing.    * >=100,000 COLONIES/mL KLEBSIELLA PNEUMONIAE  Resp panel by RT-PCR (RSV, Flu A&B, Covid) Urine, Clean Catch     Status: None   Collection Time: 08/30/24  4:31 PM   Specimen: Urine, Clean Catch; Nasal Swab  Result Value Ref Range Status   SARS Coronavirus 2 by RT PCR NEGATIVE NEGATIVE Final    Comment: (NOTE) SARS-CoV-2 target nucleic acids are NOT DETECTED.  The SARS-CoV-2 RNA is generally detectable in upper respiratory specimens during the acute phase of infection. The lowest concentration of SARS-CoV-2 viral copies this assay can detect is 138 copies/mL. A negative result does not preclude SARS-Cov-2 infection and should not be used as the sole basis for treatment or other patient management decisions. A negative result may occur with  improper  specimen collection/handling, submission of specimen other than nasopharyngeal swab, presence of viral mutation(s) within the areas targeted by this assay, and inadequate number of viral copies(<138 copies/mL). A negative result must be combined with clinical observations, patient history, and epidemiological information. The expected result is Negative.  Fact Sheet for Patients:  BloggerCourse.com  Fact Sheet for Healthcare Providers:  SeriousBroker.it  This test is no t yet approved or cleared by the [place]  FDA and  has been authorized for detection and/or diagnosis of SARS-CoV-2 by FDA under an Emergency Use Authorization (EUA). This EUA will remain  in effect (meaning this test can be used) for the duration of the COVID-19 declaration under Section 564(b)(1) of the Act, 21 U.S.C.section 360bbb-3(b)(1), unless the authorization is terminated  or revoked sooner.       Influenza A by PCR NEGATIVE NEGATIVE Final   Influenza B by PCR NEGATIVE NEGATIVE Final    Comment: (NOTE) The Xpert Xpress SARS-CoV-2/FLU/RSV plus assay is intended as an aid in the diagnosis of influenza from Nasopharyngeal swab specimens and should not be used as a sole basis for treatment. Nasal washings and aspirates are unacceptable for Xpert Xpress SARS-CoV-2/FLU/RSV testing.  Fact Sheet for Patients: BloggerCourse.com  Fact Sheet for Healthcare Providers: SeriousBroker.it  This test is not yet approved or cleared by the [place]  FDA and has been authorized for detection and/or diagnosis of SARS-CoV-2 by FDA under an Emergency Use Authorization (EUA). This  EUA will remain in effect (meaning this test can be used) for the duration of the COVID-19 declaration under Section 564(b)(1) of the Act, 21 U.S.C. section 360bbb-3(b)(1), unless the authorization is terminated or revoked.     Resp  Syncytial Virus by PCR NEGATIVE NEGATIVE Final    Comment: (NOTE) Fact Sheet for Patients: BloggerCourse.com  Fact Sheet for Healthcare Providers: SeriousBroker.it  This test is not yet approved or cleared by the [place]  FDA and has been authorized for detection and/or diagnosis of SARS-CoV-2 by FDA under an Emergency Use Authorization (EUA). This EUA will remain in effect (meaning this test can be used) for the duration of the COVID-19 declaration under Section 564(b)(1) of the Act, 21 U.S.C. section 360bbb-3(b)(1), unless the authorization is terminated or revoked.  Performed at [facility], [address] [place], [place] [address]    [x]  Patient discharged originally without antimicrobial agent. Antibiotic treatment is not warranted at this time due to low suspicion of UTI based on absence of urinary symptoms.  ED Provider: Norleen Essex, PA   B. Maegan Jovante Hammitt, PharmD PGY-1 Pharmacy Resident [facility] 09/02/2024 9:07 AM   Monday - Friday phone -  [phone] Saturday - Sunday phone - [phone]
